# Patient Record
Sex: Female | Born: 1940 | Race: White | Hispanic: No | Marital: Married | Smoking: Former smoker
Health system: Southern US, Community
[De-identification: ages and names within clinical notes are randomized; demographics above are authoritative.]

## PROBLEM LIST (undated history)

## (undated) DIAGNOSIS — G609 Hereditary and idiopathic neuropathy, unspecified: Secondary | ICD-10-CM

## (undated) DIAGNOSIS — K5090 Crohn's disease, unspecified, without complications: Secondary | ICD-10-CM

## (undated) DIAGNOSIS — J449 Chronic obstructive pulmonary disease, unspecified: Secondary | ICD-10-CM

## (undated) DIAGNOSIS — F32A Depression, unspecified: Secondary | ICD-10-CM

## (undated) DIAGNOSIS — D510 Vitamin B12 deficiency anemia due to intrinsic factor deficiency: Secondary | ICD-10-CM

## (undated) DIAGNOSIS — A809 Acute poliomyelitis, unspecified: Secondary | ICD-10-CM

## (undated) DIAGNOSIS — G43909 Migraine, unspecified, not intractable, without status migrainosus: Secondary | ICD-10-CM

## (undated) DIAGNOSIS — M1990 Unspecified osteoarthritis, unspecified site: Secondary | ICD-10-CM

## (undated) DIAGNOSIS — F419 Anxiety disorder, unspecified: Secondary | ICD-10-CM

## (undated) DIAGNOSIS — D496 Neoplasm of unspecified behavior of brain: Secondary | ICD-10-CM

## (undated) DIAGNOSIS — E7800 Pure hypercholesterolemia, unspecified: Secondary | ICD-10-CM

## (undated) DIAGNOSIS — H3530 Unspecified macular degeneration: Secondary | ICD-10-CM

## (undated) DIAGNOSIS — F329 Major depressive disorder, single episode, unspecified: Secondary | ICD-10-CM

## (undated) DIAGNOSIS — E271 Primary adrenocortical insufficiency: Secondary | ICD-10-CM

## (undated) DIAGNOSIS — Z7901 Long term (current) use of anticoagulants: Secondary | ICD-10-CM

## (undated) DIAGNOSIS — K219 Gastro-esophageal reflux disease without esophagitis: Secondary | ICD-10-CM

## (undated) DIAGNOSIS — H9191 Unspecified hearing loss, right ear: Secondary | ICD-10-CM

## (undated) DIAGNOSIS — I10 Essential (primary) hypertension: Secondary | ICD-10-CM

## (undated) HISTORY — PX: HEMICOLECTOMY: SHX854

## (undated) HISTORY — PX: OTHER SURGICAL HISTORY: SHX169

## (undated) HISTORY — PX: CHOLECYSTECTOMY: SHX55

---

## 1998-05-16 ENCOUNTER — Ambulatory Visit (HOSPITAL_COMMUNITY): Admission: RE | Admit: 1998-05-16 | Discharge: 1998-05-16 | Payer: Self-pay | Admitting: General Surgery

## 1998-06-01 ENCOUNTER — Inpatient Hospital Stay (HOSPITAL_COMMUNITY): Admission: EM | Admit: 1998-06-01 | Discharge: 1998-06-03 | Payer: Self-pay | Admitting: Endocrinology

## 1998-06-30 ENCOUNTER — Ambulatory Visit (HOSPITAL_COMMUNITY): Admission: RE | Admit: 1998-06-30 | Discharge: 1998-06-30 | Payer: Self-pay | Admitting: Orthopedic Surgery

## 1998-07-21 ENCOUNTER — Other Ambulatory Visit: Admission: RE | Admit: 1998-07-21 | Discharge: 1998-07-21 | Payer: Self-pay | Admitting: Gynecology

## 1999-03-29 ENCOUNTER — Encounter: Payer: Self-pay | Admitting: Gastroenterology

## 1999-03-29 ENCOUNTER — Emergency Department (HOSPITAL_COMMUNITY): Admission: EM | Admit: 1999-03-29 | Discharge: 1999-03-29 | Payer: Self-pay | Admitting: Emergency Medicine

## 1999-05-10 ENCOUNTER — Ambulatory Visit (HOSPITAL_COMMUNITY): Admission: RE | Admit: 1999-05-10 | Discharge: 1999-05-10 | Payer: Self-pay | Admitting: Gastroenterology

## 1999-05-10 ENCOUNTER — Encounter (INDEPENDENT_AMBULATORY_CARE_PROVIDER_SITE_OTHER): Payer: Self-pay | Admitting: Specialist

## 1999-05-22 ENCOUNTER — Ambulatory Visit (HOSPITAL_COMMUNITY): Admission: RE | Admit: 1999-05-22 | Discharge: 1999-05-22 | Payer: Self-pay | Admitting: Gastroenterology

## 1999-05-22 ENCOUNTER — Encounter: Payer: Self-pay | Admitting: Gastroenterology

## 1999-05-24 ENCOUNTER — Encounter: Payer: Self-pay | Admitting: Gastroenterology

## 1999-05-24 ENCOUNTER — Ambulatory Visit (HOSPITAL_COMMUNITY): Admission: RE | Admit: 1999-05-24 | Discharge: 1999-05-24 | Payer: Self-pay | Admitting: Gastroenterology

## 1999-06-15 ENCOUNTER — Encounter: Payer: Self-pay | Admitting: General Surgery

## 1999-06-19 ENCOUNTER — Encounter (INDEPENDENT_AMBULATORY_CARE_PROVIDER_SITE_OTHER): Payer: Self-pay

## 1999-06-19 ENCOUNTER — Ambulatory Visit (HOSPITAL_COMMUNITY): Admission: RE | Admit: 1999-06-19 | Discharge: 1999-06-19 | Payer: Self-pay | Admitting: General Surgery

## 1999-08-09 ENCOUNTER — Ambulatory Visit (HOSPITAL_COMMUNITY): Admission: RE | Admit: 1999-08-09 | Discharge: 1999-08-09 | Payer: Self-pay | Admitting: Specialist

## 2004-03-27 ENCOUNTER — Emergency Department (HOSPITAL_COMMUNITY): Admission: EM | Admit: 2004-03-27 | Discharge: 2004-03-27 | Payer: Self-pay | Admitting: *Deleted

## 2004-05-23 ENCOUNTER — Other Ambulatory Visit: Admission: RE | Admit: 2004-05-23 | Discharge: 2004-05-23 | Payer: Self-pay | Admitting: Gynecology

## 2004-10-28 ENCOUNTER — Emergency Department (HOSPITAL_COMMUNITY): Admission: EM | Admit: 2004-10-28 | Discharge: 2004-10-28 | Payer: Self-pay | Admitting: Emergency Medicine

## 2005-09-19 ENCOUNTER — Encounter: Admission: RE | Admit: 2005-09-19 | Discharge: 2005-09-19 | Payer: Self-pay | Admitting: General Surgery

## 2006-03-01 ENCOUNTER — Encounter: Admission: RE | Admit: 2006-03-01 | Discharge: 2006-03-01 | Payer: Self-pay | Admitting: Endocrinology

## 2007-03-18 ENCOUNTER — Ambulatory Visit (HOSPITAL_COMMUNITY): Admission: RE | Admit: 2007-03-18 | Discharge: 2007-03-18 | Payer: Self-pay | Admitting: Endocrinology

## 2008-05-17 ENCOUNTER — Emergency Department (HOSPITAL_COMMUNITY): Admission: EM | Admit: 2008-05-17 | Discharge: 2008-05-17 | Payer: Self-pay | Admitting: Emergency Medicine

## 2008-05-20 ENCOUNTER — Encounter: Admission: RE | Admit: 2008-05-20 | Discharge: 2008-05-20 | Payer: Self-pay | Admitting: Orthopedic Surgery

## 2008-08-02 ENCOUNTER — Encounter: Admission: RE | Admit: 2008-08-02 | Discharge: 2008-08-02 | Payer: Self-pay | Admitting: Gastroenterology

## 2008-10-01 ENCOUNTER — Inpatient Hospital Stay (HOSPITAL_COMMUNITY): Admission: EM | Admit: 2008-10-01 | Discharge: 2008-10-04 | Payer: Self-pay | Admitting: Emergency Medicine

## 2009-01-18 ENCOUNTER — Encounter: Admission: RE | Admit: 2009-01-18 | Discharge: 2009-01-18 | Payer: Self-pay | Admitting: Gastroenterology

## 2009-01-27 ENCOUNTER — Encounter: Admission: RE | Admit: 2009-01-27 | Discharge: 2009-01-27 | Payer: Self-pay | Admitting: Gastroenterology

## 2009-04-05 ENCOUNTER — Encounter: Admission: RE | Admit: 2009-04-05 | Discharge: 2009-04-05 | Payer: Self-pay | Admitting: Orthopedic Surgery

## 2009-04-20 ENCOUNTER — Encounter: Admission: RE | Admit: 2009-04-20 | Discharge: 2009-04-20 | Payer: Self-pay | Admitting: Orthopedic Surgery

## 2010-05-25 ENCOUNTER — Encounter: Admission: RE | Admit: 2010-05-25 | Discharge: 2010-05-25 | Payer: Self-pay | Admitting: Endocrinology

## 2010-09-03 ENCOUNTER — Encounter: Payer: Self-pay | Admitting: Endocrinology

## 2010-11-28 LAB — BASIC METABOLIC PANEL
BUN: 13 mg/dL (ref 6–23)
CO2: 33 mEq/L — ABNORMAL HIGH (ref 19–32)
Calcium: 8.1 mg/dL — ABNORMAL LOW (ref 8.4–10.5)
Chloride: 104 mEq/L (ref 96–112)
Chloride: 92 mEq/L — ABNORMAL LOW (ref 96–112)
Chloride: 97 mEq/L (ref 96–112)
Creatinine, Ser: 0.9 mg/dL (ref 0.4–1.2)
Creatinine, Ser: 1.07 mg/dL (ref 0.4–1.2)
GFR calc Af Amer: >60 mL/min (ref >60)
GFR calc non Af Amer: 50 mL/min — ABNORMAL LOW (ref >60)
Glucose, Bld: 118 mg/dL — ABNORMAL HIGH (ref 70–99)
Glucose, Bld: 126 mg/dL — ABNORMAL HIGH (ref 70–99)
Potassium: 4 mEq/L (ref 3.5–5.1)
Sodium: 134 mEq/L — ABNORMAL LOW (ref 135–145)

## 2010-11-28 LAB — CARDIAC PANEL(CRET KIN+CKTOT+MB+TROPI)
Relative Index: INVALID (ref 0.0–2.5)
Relative Index: INVALID (ref 0.0–2.5)
Total CK: 56 U/L (ref 7–177)
Total CK: 56 U/L (ref 7–177)
Troponin I: 0.01 ng/mL (ref 0.00–0.06)
Troponin I: 0.02 ng/mL (ref 0.00–0.06)

## 2010-11-28 LAB — COMPREHENSIVE METABOLIC PANEL
ALT: 15 U/L (ref 0–35)
Albumin: 3.4 g/dL — ABNORMAL LOW (ref 3.5–5.2)
Alkaline Phosphatase: 54 U/L (ref 39–117)
Chloride: 94 mEq/L — ABNORMAL LOW (ref 96–112)
Creatinine, Ser: 1.09 mg/dL (ref 0.4–1.2)
Potassium: 3 mEq/L — ABNORMAL LOW (ref 3.5–5.1)
Total Bilirubin: 0.7 mg/dL (ref 0.3–1.2)
Total Protein: 6 g/dL (ref 6.0–8.3)

## 2010-11-28 LAB — URINALYSIS, ROUTINE W REFLEX MICROSCOPIC
Bilirubin Urine: NEGATIVE
Glucose, UA: NEGATIVE mg/dL
Specific Gravity, Urine: 1.013 (ref 1.005–1.030)
pH: 5.5 (ref 5.0–8.0)

## 2010-11-28 LAB — CBC
Hemoglobin: 12.8 g/dL (ref 12.0–15.0)
Hemoglobin: 13.3 g/dL (ref 12.0–15.0)
MCHC: 33.4 g/dL (ref 30.0–36.0)
MCV: 99 fL (ref 78.0–100.0)
MCV: 99.4 fL (ref 78.0–100.0)
MCV: 99.8 fL (ref 78.0–100.0)
Platelets: 131 10*3/uL — ABNORMAL LOW (ref 150–400)
RBC: 3.84 MIL/uL — ABNORMAL LOW (ref 3.87–5.11)
RBC: 4.49 MIL/uL (ref 3.87–5.11)
RDW: 14 % (ref 11.5–15.5)
RDW: 14.3 % (ref 11.5–15.5)
WBC: 10.2 10*3/uL (ref 4.0–10.5)

## 2010-11-28 LAB — DIFFERENTIAL
Eosinophils Relative: 0 % (ref 0–5)
Lymphocytes Relative: 12 % (ref 12–46)
Monocytes Absolute: 0.9 10*3/uL (ref 0.1–1.0)
Monocytes Relative: 9 % (ref 3–12)
Neutrophils Relative %: 79 % — ABNORMAL HIGH (ref 43–77)

## 2010-11-28 LAB — URINE CULTURE: Colony Count: >=100000

## 2010-11-28 LAB — CK TOTAL AND CKMB (NOT AT ARMC)
CK, MB: 1.1 ng/mL (ref 0.3–4.0)
Relative Index: INVALID (ref 0.0–2.5)
Total CK: 49 U/L (ref 7–177)

## 2010-12-26 NOTE — Discharge Summary (Signed)
NAMETEKOA, HAMOR                ACCOUNT NO.:  [id number]   MEDICAL RECORD NO.:  [id number]          PATIENT TYPE:  INP   LOCATION:  1436                         FACILITY:  [facility]   PHYSICIAN:  Tera Mater. [name], M.D. DATE OF BIRTH:  1941-05-10   DATE OF ADMISSION:  10/01/2008  DATE OF DISCHARGE:  10/04/2008                               DISCHARGE SUMMARY   DISCHARGE DIAGNOSES:  1. Nausea and vomiting with volume deficit, clinically improved.  2. Transient hypotension due to relative adrenal insufficiency.  3. Severe chronic obstructive pulmonary disease.  4. History of Crohn's disease for 45 years with multiple surgeries,      now stable on low dose steroids.  5. Osteoporosis with a multitude of compression fractures.  6. Treated vitamin D deficiency.  7. History of pulmonary embolism with negative VQ scan this admission.  8. Hypertension, improved on less medication.  9. Impaired glucose tolerance without significant flare this      hospitalization.  10.Depression/anxiety, under control.  11.History of pernicious anemia with stable status.  12.Enterococcal urinary tract infection.  13.Hypokalemia.   PROCEDURES:  Included a ventilation-perfusion lung scan.   Ms. Lehrmann is a 70-year white female, longstanding patient of my  practice, who presented to my partner Dr. Sandra Cockayne on  October 01, 2008.  She was there with nausea and vomiting with  dehydration and hypotension.  There was felt to be some component of  relative adrenal insufficiency since the patient is on chronic steroids.  The patient had been taking her medications but there is some question  whether or not she had been able to keep all of that down.  The patient  is now clinically improved quite a bit.  She has received extensive  fluid resuscitation and blood pressure has done well, now bouncing from  the 120s to 130s systolic.  She had some bradycardia earlier and her  nadolol has been reduced and now  she is running in the 70s to 80s.  Her  oxygen saturation is fair at 94% which is consistent with significant  chronic lung disease.  She is eating breakfast at the present with no  complaints.  She has had three bowel movements on the day of admission,  no diarrhea.  She has had no fevers, chills or sweats while here.  She  does have somewhat of a deep cough with no production.  I do not think  this represents a significant pulmonary issue.  Her energy is improving.  She has no other complaints.  Today's vital signs:  Blood pressure  133/59, pulse 73, respiratory rate 18, temperature 98.1, O2 sat 94%.   RADIOLOGY/TESTING:  Nuclear medicine perfusion lung scan on the 19th was  low probability of pulmonary embolism with findings consistent with  severe COPD.  Her urine cultures noted with enterococcus with  sensitivities pending.  Sodium was 145, potassium 2.8, chloride 104, CO2  - 33, BUN 19, creatinine 0.9, glucose of 126, calcium is 8.1, white  count is 6400, hemoglobin 13.3, MCV 99.4, platelets 134,000.  Cardiac  enzymes were done on the 20th  with a troponin of 0.02 and a CK of 56 and  on the 20th also troponin 0.01 with a CK of 56 and troponin on the 19th  was 0.02 and CK was 49.  Initial white count was 10,200 and hemoglobin  15.1, platelets 147,000.  Initial chemistries:  Sodium 138, potassium 3,  chloride 94, CO2 - 36, BUN 12, creatinine 1.09, glucose 104.  A  urinalysis showed large blood, small nitrates with a microscopic showing  11 - 20 white cells.   SUMMARY:  We have a 70 year old white female presenting with what  probably was a viral illness leading to nausea, vomiting and then  relative adrenal insufficiency.  She has done well with fluid  resuscitation.  Remaining issues are need to treat an enterococcal UTI  and replacement of potassium still needed.  She is doing better on less  blood pressure medications.  She is discharged home in improved  condition.   DIET:  No  concentrated sweets.   MEDICATIONS AT DISCHARGE:  1. Amlodipine 2.5 daily.  2. Calcium 600 twice a day.  3. Cymbalta 60 daily.  4. Darvon 2 tablets four times a day as needed.  5. Fentanyl patch 75 mcg every 48 hours.  6. HCTZ 25 mg daily.  7. MiraLax 17 grams daily.  8. Potassium is going up to a new dose of 40 mEq twice daily.  9. Prednisone will be a dose of 7.5 mg daily for 3 days, and then down      to 5 mg daily.  10.Ranitidine is 150 daily.  11.Simvastatin 80 mg daily.  12.Xanax was 0.25 three times a day.  13.Miacalcin nasal sprays daily.  14.Aspirin 81 daily.  15.Effexor XR 150 daily.  16.Asacol 400 daily.  17.Vitamin D 50,000 a week.  18.Nadolol actually will be held until I see her on March 10 when she      sees me back.           ______________________________  Tera Mater. [name], M.D.     SAS/MEDQ  D:  10/04/2008  T:  10/04/2008  Job:  [id number]

## 2010-12-26 NOTE — H&P (Signed)
NAMELAEL, WETHERBEE                ACCOUNT NO.:  [id number]   MEDICAL RECORD NO.:  [id number]          PATIENT TYPE:  INP   LOCATION:  1436                         FACILITY:  [facility]   PHYSICIAN:  Kari Baars, M.D.  DATE OF BIRTH:  1941-05-29   DATE OF ADMISSION:  10/01/2008  DATE OF DISCHARGE:                              HISTORY & PHYSICAL   CHIEF COMPLAINT:  Nausea, vomiting, shortness of breath.   HISTORY OF PRESENT ILLNESS:  Ms. Pollitt is a 70 year old white female  with a history of Crohn's disease on chronic steroid therapy, chronic  pain secondary to multiple compression fractures, history of PE, and  presumed COPD who presented to our office today with a complaint of  nausea, vomiting and increased shortness of breath over the past 8  hours.  The patient reports that she has had a slight increase in  shortness of breath recently, but was at her baseline until yesterday  evening when she noted she had an increasing cough after walking in the  cold.  Last evening she awoke at about 3:00 a.m. with nausea and  vomiting and retching.  She had clears emesis throughout the morning  this morning and then developed increasing shortness of breath and  cough.  She presented to our office where her oxygen saturations were  87%.  She reports that she had a fever of 101 at home this morning.  Denies any diarrhea or significant abdominal pain.  Her Crohn's has been  stable, quiescent off of her Asacol and on prednisone.  She has been off  of her Asacol since 07/2008.   PAST MEDICAL HISTORY:  1. COPD.  2. Crohn's disease since 1966 on chronic steroid therapy status post      ileal resection and fistula repair.  3. Osteoporosis with multiple compression fractures.  4. Vitamin D deficiency.  5. History of pulmonary embolism.  6. History of avascular necrosis of the hip.  7. Hypertension.  8. Glucose tolerance  9. Depression/anxiety.  10.Pernicious anemia.  11.Hyperlipidemia.  12.Status post cholecystectomy.  13.Status post cataract repair.   CURRENT MEDICATIONS:  1. Prednisone 5 mg daily.  2. Klor-Con 10 mEq daily.  3. Nadolol 20 mg daily.  4. Norvasc 2.5 mg daily.  5. HCTZ 25 mg daily.  6. Zocor 80 mg daily.  7. Alprazolam 0.25 mg t.i.d.  8. Darvocet N-100.  9. Darvon eight daily.  10.Duragesic 75 mcg q.48 h.  11.Calcium/vitamin D.  12.Aspirin 81 mg daily.  13.Cymbalta 60 mg daily.  14.Zantac 150 mg b.i.d.   ALLERGIES:  PENICILLIN, PRILOSEC, BACTRIM, VOLTAREN, LOTREL.   SOCIAL HISTORY:  She is married and smokes a pack per day.  She is  currently in a smoking rehabilitation program and has decreased her  smoking significantly.   FAMILY HISTORY:  No COPD or heart disease.   PHYSICAL EXAMINATION:  VITAL SIGNS:  Temperature 98.8, pulse 89, blood  pressure 124/78, respirations 18, weight 113 which is down 9 pounds,  oxygen saturation 87% on room air.  GENERAL:  Frail, pale appearing female in a wheelchair.  HEENT:  Oropharynx  is dry.  NECK:  Supple without lymphadenopathy, JVD or carotid bruits.  HEART:  Regular rate and rhythm, without murmurs, rubs or gallops.  LUNGS:  Clear to auscultation bilaterally.  No wheezing or crackles.  ABDOMEN:  Soft, nondistended, nontender with normoactive bowel sounds.  No tenderness or rebound or guarding.  EXTREMITIES:  No clubbing, cyanosis or edema.  She does have decrease in  skin turgor.  MUSCULOSKELETAL:  She has severe kyphosis.   STUDIES:  1. EKG shows normal sinus rhythm with nonspecific ST changes.  2. Chest x-ray shows hyperinflation with severe kyphosis.  No acute      cardiopulmonary disease.   ASSESSMENT/PLAN:  1. Nausea and vomiting resulting in dehydration - this may be due to      early gastroenteritis (norovirus is in the community) versus      medication related (narcotics).  Her exam does not suggest a small-      bowel obstruction or intra-abdominal source.  Will obtain an acute       abdominal series, CBC, C-met, urinalysis with cultures.  Rule out      MI though cardiac ischemia is slow on the differential.  Will treat      empirically with IV antiemetics and IV fluid hydration.  2. Hypoxia/shortness of breath.  Possible aspiration event.  We will      treat empirically with Avelox 400 mg IV daily and oxygen as needed.      Given her history of pulmonary embolism.  Will obtain a VQ scan to      rule out pulmonary embolism.  3. Chronic pain secondary to compression fractions.  Continue      Duragesic and Darvon.  4. DVT prophylaxis with Lovenox.   DISPOSITION:  Anticipate discharge to home once tolerating p.o.'s.   DISCHARGE INSTRUCTIONS:  Diet:  Clear liquid diet.  We will advance as  tolerated.      Kari Baars, M.D.  Electronically Signed     WS/MEDQ  D:  10/01/2008  T:  10/02/2008  Job:  [id number]   cc:   Bernette Redbird, M.D.  Fax: [phone]   Tera Mater. [name], M.D.  Fax: [phone]

## 2011-05-15 LAB — URINALYSIS, ROUTINE W REFLEX MICROSCOPIC
Ketones, ur: NEGATIVE
Protein, ur: NEGATIVE
Urobilinogen, UA: 0.2

## 2011-05-15 LAB — URINE MICROSCOPIC-ADD ON

## 2011-05-15 LAB — DIFFERENTIAL
Basophils Absolute: 0
Basophils Relative: 0
Eosinophils Absolute: 0
Eosinophils Relative: 0
Lymphocytes Relative: 29
Monocytes Absolute: 0.5

## 2011-05-15 LAB — CBC
HCT: 45
Hemoglobin: 14.9
MCHC: 33
Platelets: 168
RDW: 14.6

## 2011-05-15 LAB — POCT CARDIAC MARKERS: Myoglobin, poc: 60.6

## 2011-08-12 ENCOUNTER — Emergency Department (HOSPITAL_COMMUNITY): Payer: Medicare Other

## 2011-08-12 ENCOUNTER — Encounter: Payer: Self-pay | Admitting: *Deleted

## 2011-08-12 ENCOUNTER — Inpatient Hospital Stay (HOSPITAL_COMMUNITY)
Admission: EM | Admit: 2011-08-12 | Discharge: 2011-08-19 | DRG: 378 | Disposition: A | Discharge status: Home / Self Care | Payer: Medicare Other | Attending: Endocrinology | Admitting: Endocrinology

## 2011-08-12 DIAGNOSIS — I2699 Other pulmonary embolism without acute cor pulmonale: Secondary | ICD-10-CM

## 2011-08-12 DIAGNOSIS — F329 Major depressive disorder, single episode, unspecified: Secondary | ICD-10-CM | POA: Diagnosis present

## 2011-08-12 DIAGNOSIS — R197 Diarrhea, unspecified: Secondary | ICD-10-CM | POA: Diagnosis present

## 2011-08-12 DIAGNOSIS — F17200 Nicotine dependence, unspecified, uncomplicated: Secondary | ICD-10-CM | POA: Diagnosis present

## 2011-08-12 DIAGNOSIS — D126 Benign neoplasm of colon, unspecified: Secondary | ICD-10-CM | POA: Diagnosis present

## 2011-08-12 DIAGNOSIS — G8929 Other chronic pain: Secondary | ICD-10-CM | POA: Diagnosis present

## 2011-08-12 DIAGNOSIS — K921 Melena: Principal | ICD-10-CM | POA: Diagnosis present

## 2011-08-12 DIAGNOSIS — R791 Abnormal coagulation profile: Secondary | ICD-10-CM | POA: Diagnosis present

## 2011-08-12 DIAGNOSIS — K922 Gastrointestinal hemorrhage, unspecified: Secondary | ICD-10-CM | POA: Diagnosis present

## 2011-08-12 DIAGNOSIS — G894 Chronic pain syndrome: Secondary | ICD-10-CM | POA: Diagnosis present

## 2011-08-12 DIAGNOSIS — E876 Hypokalemia: Secondary | ICD-10-CM | POA: Diagnosis present

## 2011-08-12 DIAGNOSIS — M810 Age-related osteoporosis without current pathological fracture: Secondary | ICD-10-CM | POA: Diagnosis present

## 2011-08-12 DIAGNOSIS — Z7901 Long term (current) use of anticoagulants: Secondary | ICD-10-CM

## 2011-08-12 DIAGNOSIS — K625 Hemorrhage of anus and rectum: Secondary | ICD-10-CM

## 2011-08-12 DIAGNOSIS — D62 Acute posthemorrhagic anemia: Secondary | ICD-10-CM | POA: Diagnosis present

## 2011-08-12 DIAGNOSIS — F411 Generalized anxiety disorder: Secondary | ICD-10-CM | POA: Diagnosis present

## 2011-08-12 DIAGNOSIS — E2740 Unspecified adrenocortical insufficiency: Secondary | ICD-10-CM | POA: Diagnosis present

## 2011-08-12 DIAGNOSIS — E119 Type 2 diabetes mellitus without complications: Secondary | ICD-10-CM | POA: Diagnosis present

## 2011-08-12 DIAGNOSIS — N390 Urinary tract infection, site not specified: Secondary | ICD-10-CM | POA: Diagnosis present

## 2011-08-12 DIAGNOSIS — J449 Chronic obstructive pulmonary disease, unspecified: Secondary | ICD-10-CM | POA: Diagnosis present

## 2011-08-12 DIAGNOSIS — E2749 Other adrenocortical insufficiency: Secondary | ICD-10-CM | POA: Diagnosis present

## 2011-08-12 DIAGNOSIS — R109 Unspecified abdominal pain: Secondary | ICD-10-CM | POA: Diagnosis present

## 2011-08-12 DIAGNOSIS — R319 Hematuria, unspecified: Secondary | ICD-10-CM | POA: Diagnosis present

## 2011-08-12 DIAGNOSIS — J4489 Other specified chronic obstructive pulmonary disease: Secondary | ICD-10-CM | POA: Diagnosis present

## 2011-08-12 DIAGNOSIS — D6832 Hemorrhagic disorder due to extrinsic circulating anticoagulants: Secondary | ICD-10-CM

## 2011-08-12 DIAGNOSIS — F3289 Other specified depressive episodes: Secondary | ICD-10-CM | POA: Diagnosis present

## 2011-08-12 DIAGNOSIS — T45515A Adverse effect of anticoagulants, initial encounter: Secondary | ICD-10-CM | POA: Diagnosis present

## 2011-08-12 DIAGNOSIS — K5090 Crohn's disease, unspecified, without complications: Secondary | ICD-10-CM | POA: Diagnosis present

## 2011-08-12 DIAGNOSIS — I2782 Chronic pulmonary embolism: Secondary | ICD-10-CM | POA: Diagnosis present

## 2011-08-12 HISTORY — DX: Gastro-esophageal reflux disease without esophagitis: K21.9

## 2011-08-12 HISTORY — DX: Primary adrenocortical insufficiency: E27.1

## 2011-08-12 HISTORY — DX: Unspecified osteoarthritis, unspecified site: M19.90

## 2011-08-12 HISTORY — DX: Crohn's disease, unspecified, without complications: K50.90

## 2011-08-12 HISTORY — DX: Major depressive disorder, single episode, unspecified: F32.9

## 2011-08-12 HISTORY — DX: Depression, unspecified: F32.A

## 2011-08-12 HISTORY — DX: Essential (primary) hypertension: I10

## 2011-08-12 HISTORY — DX: Acute poliomyelitis, unspecified: A80.9

## 2011-08-12 HISTORY — DX: Chronic obstructive pulmonary disease, unspecified: J44.9

## 2011-08-12 HISTORY — DX: Hereditary and idiopathic neuropathy, unspecified: G60.9

## 2011-08-12 HISTORY — DX: Anxiety disorder, unspecified: F41.9

## 2011-08-12 HISTORY — DX: Migraine, unspecified, not intractable, without status migrainosus: G43.909

## 2011-08-12 HISTORY — DX: Neoplasm of unspecified behavior of brain: D49.6

## 2011-08-12 HISTORY — DX: Long term (current) use of anticoagulants: Z79.01

## 2011-08-12 HISTORY — DX: Unspecified macular degeneration: H35.30

## 2011-08-12 HISTORY — DX: Vitamin B12 deficiency anemia due to intrinsic factor deficiency: D51.0

## 2011-08-12 HISTORY — DX: Pure hypercholesterolemia, unspecified: E78.00

## 2011-08-12 LAB — COMPREHENSIVE METABOLIC PANEL
ALT: 6 U/L (ref 0–35)
AST: 10 U/L (ref 0–37)
Alkaline Phosphatase: 42 U/L (ref 39–117)
CO2: 31 mEq/L (ref 19–32)
Calcium: 8.2 mg/dL — ABNORMAL LOW (ref 8.4–10.5)
Creatinine, Ser: 1.01 mg/dL (ref 0.50–1.10)
GFR calc Af Amer: 65 mL/min — ABNORMAL LOW (ref >90)
GFR calc non Af Amer: 56 mL/min — ABNORMAL LOW (ref >90)
Sodium: 138 mEq/L (ref 135–145)
Total Bilirubin: 0.4 mg/dL (ref 0.3–1.2)
Total Protein: 4.9 g/dL — ABNORMAL LOW (ref 6.0–8.3)

## 2011-08-12 LAB — COMPREHENSIVE METABOLIC PANEL WITH GFR
Albumin: 2.5 g/dL — ABNORMAL LOW (ref 3.5–5.2)
BUN: 27 mg/dL — ABNORMAL HIGH (ref 6–23)
Chloride: 102 meq/L (ref 96–112)
Glucose, Bld: 122 mg/dL — ABNORMAL HIGH (ref 70–99)
Potassium: 3.3 meq/L — ABNORMAL LOW (ref 3.5–5.1)

## 2011-08-12 LAB — DIFFERENTIAL
Basophils Absolute: 0 10*3/uL (ref 0.0–0.1)
Basophils Relative: 0 % (ref 0–1)
Eosinophils Absolute: 0.1 10*3/uL (ref 0.0–0.7)
Eosinophils Relative: 0 % (ref 0–5)
Lymphocytes Relative: 21 % (ref 12–46)
Lymphs Abs: 3 K/uL (ref 0.7–4.0)
Monocytes Absolute: 1.9 10*3/uL — ABNORMAL HIGH (ref 0.1–1.0)
Monocytes Relative: 13 % — ABNORMAL HIGH (ref 3–12)
Neutro Abs: 9.1 K/uL — ABNORMAL HIGH (ref 1.7–7.7)
Neutrophils Relative %: 65 % (ref 43–77)

## 2011-08-12 LAB — CBC
HCT: 25.3 % — ABNORMAL LOW (ref 36.0–46.0)
Hemoglobin: 8.3 g/dL — ABNORMAL LOW (ref 12.0–15.0)
MCH: 31.6 pg (ref 26.0–34.0)
MCHC: 32.8 g/dL (ref 30.0–36.0)
MCV: 96.2 fL (ref 78.0–100.0)
Platelets: 197 10*3/uL (ref 150–400)
RBC: 2.63 MIL/uL — ABNORMAL LOW (ref 3.87–5.11)
RDW: 15.4 % (ref 11.5–15.5)
WBC: 14 K/uL — ABNORMAL HIGH (ref 4.0–10.5)

## 2011-08-12 LAB — LACTIC ACID, PLASMA: Lactic Acid, Venous: 1.4 mmol/L (ref 0.5–2.2)

## 2011-08-12 LAB — PROTIME-INR
INR: 5.61 (ref 0.00–1.49)
Prothrombin Time: 51.5 s — ABNORMAL HIGH (ref 11.6–15.2)

## 2011-08-12 LAB — LIPASE, BLOOD: Lipase: 8 U/L — ABNORMAL LOW (ref 11–59)

## 2011-08-12 LAB — APTT: aPTT: 93 s — ABNORMAL HIGH (ref 24–37)

## 2011-08-12 LAB — OCCULT BLOOD, POC DEVICE: Fecal Occult Bld: POSITIVE

## 2011-08-12 MED ORDER — FENTANYL CITRATE 0.05 MG/ML IJ SOLN
50.0000 ug | INTRAMUSCULAR | Status: DC | PRN
  Administered 2011-08-12 – 2011-08-13 (×2): 50 ug via INTRAVENOUS
  Filled 2011-08-12 (×2): qty 2

## 2011-08-12 MED ORDER — SODIUM CHLORIDE 0.9 % IV SOLN
Freq: Once | INTRAVENOUS | Status: AC
  Administered 2011-08-12: 23:00:00 via INTRAVENOUS

## 2011-08-12 MED ORDER — SODIUM CHLORIDE 0.9 % IV BOLUS (SEPSIS)
500.0000 mL | Freq: Once | INTRAVENOUS | Status: AC
  Administered 2011-08-13: 03:00:00 via INTRAVENOUS

## 2011-08-12 NOTE — ED Notes (Signed)
ZOX:[id number]<AV> Expected date:08/12/11<BR> Expected time: 6:33 PM<BR> Means of arrival:Ambulance<BR> Comments:<BR> M51 - 69yoF Blood in stool?

## 2011-08-12 NOTE — ED Notes (Signed)
Pt reports upper abd pain last night, took pain meds. Last night and today began having loose BM, husband noticed blood in stool. Also reports pt has been weaker "for a while." Nausea, denies vomiting.

## 2011-08-12 NOTE — ED Notes (Signed)
Pt had BM in bed pan. Dark blood noted in stool.

## 2011-08-12 NOTE — ED Provider Notes (Addendum)
History     CSN: [id number]  Arrival date & time 08/12/11  2130   First MD Initiated Contact with Patient 08/12/11 2058      Chief Complaint  Patient presents with  . Rectal Bleeding  . Diarrhea  . Abdominal Pain  . Weakness    (Consider location/radiation/quality/duration/timing/severity/associated sxs/prior treatment) HPI Comments: Pt with h/o DVT, PE on coumadin developed diffuse abd pain associated with dark diarrheal stools multiple times since around 0400 today.  No N/V, but appetite is very decreased.  Pt has crhonic pain on tramadol and duragesic patch due to severe osteoporosis.  She feels very weak, fatigued, difficult to stand or get around.  No CP, SOB.  Dr. [name] is her PCP.  She has h/o constipation and HTN as well, GERD.  Pt has felt febrile with chills all day today as well.    Patient is a 70 y.o. female presenting with hematochezia, diarrhea, abdominal pain, and weakness. The history is provided by the patient and the spouse.  Rectal Bleeding  Associated symptoms include a fever, abdominal pain and diarrhea. Pertinent negatives include no nausea, no vomiting and no chest pain.  Diarrhea The primary symptoms include fever, fatigue, abdominal pain, diarrhea and hematochezia. Primary symptoms do not include nausea or vomiting.  The illness is also significant for chills.  Abdominal Pain The primary symptoms of the illness include abdominal pain, fever, fatigue, diarrhea and hematochezia. The primary symptoms of the illness do not include shortness of breath, nausea or vomiting.  Additional symptoms associated with the illness include chills.  Weakness The primary symptoms include fever. Primary symptoms do not include nausea or vomiting.  Additional symptoms include weakness.    Past Medical History  Diagnosis Date  . Polio   . Osteoporosis   . Crohn's disease   . Brain tumor   . Migraines   . Hypercholesteremia   . Hypertension   . GERD (gastroesophageal  reflux disease)   . Anxiety   . Depression   . Emphysema   . Macular degeneration   . Cataract   . Arthritis   . Pernicious anemia   . Primary adrenal deficiency     No past surgical history on file.  No family history on file.  History  Substance Use Topics  . Smoking status: Never Smoker   . Smokeless tobacco: Not on file  . Alcohol Use: No    OB History    Grav Para Term Preterm Abortions TAB SAB Ect Mult Living                  Review of Systems  Constitutional: Positive for fever, chills, activity change, appetite change and fatigue.  Respiratory: Negative for shortness of breath.   Cardiovascular: Negative for chest pain.  Gastrointestinal: Positive for abdominal pain, diarrhea, blood in stool and hematochezia. Negative for nausea and vomiting.  Neurological: Positive for weakness.  All other systems reviewed and are negative.    Allergies  Iohexol and Penicillins  Home Medications   Current Outpatient Rx  Name Route Sig Dispense Refill  . ALPRAZOLAM 0.25 MG PO TABS Oral Take 0.25 mg by mouth 3 (three) times daily.      Marland Kitchen AMLODIPINE BESYLATE 2.5 MG PO TABS Oral Take 2.5 mg by mouth daily.      Marland Kitchen CALCITONIN (SALMON) 200 UNIT/ACT NA SOLN Nasal Place 1 spray into the nose every other day.      Marland Kitchen CALCIUM CARBONATE 600 MG PO TABS Oral Take 600  mg by mouth 2 (two) times daily with a meal.      . CIPROFLOXACIN HCL 500 MG PO TABS Oral Take 500 mg by mouth 2 (two) times daily.      . DULOXETINE HCL 60 MG PO CPEP Oral Take 60 mg by mouth daily.      . FENTANYL 75 MCG/HR TD PT72 Transdermal Place 1 patch onto the skin every other day.      Marland Kitchen GABAPENTIN (PHN) 300 MG PO TABS Oral Take 1 tablet by mouth daily.      . MOMETASONE FURO-FORMOTEROL FUM 100-5 MCG/ACT IN AERO Inhalation Inhale 2 puffs into the lungs 2 (two) times daily.      Marland Kitchen NADOLOL 20 MG PO TABS Oral Take 20 mg by mouth daily.      Marland Kitchen POLYETHYLENE GLYCOL 3350 PO PACK Oral Take 17 g by mouth daily as needed.  constipation     . POTASSIUM CHLORIDE 10 MEQ PO TBCR Oral Take 5-10 mEq by mouth as directed. Takes 1 tablet daily Monday through Friday for dosage and takes 1/2 tablet Saturday and Sunday for dosage     . PREDNISONE 5 MG PO TABS Oral Take 5 mg by mouth 2 (two) times daily.      Marland Kitchen RANITIDINE HCL 300 MG PO CAPS Oral Take 300 mg by mouth 2 (two) times daily.      Marland Kitchen SIMVASTATIN 80 MG PO TABS Oral Take 80 mg by mouth daily.      . TRAMADOL HCL 50 MG PO TABS Oral Take 100 mg by mouth every 8 (eight) hours as needed. Pain.Marland KitchenMarland KitchenMaximum dose= 8 tablets per day     . VITAMIN D (ERGOCALCIFEROL) 50000 UNITS PO CAPS Oral Take 50,000 Units by mouth every 7 (seven) days.      . WARFARIN SODIUM 5 MG PO TABS Oral Take 5 mg by mouth 2 (two) times daily.        BP 123/93  Pulse 91  Temp(Src) 98.3 F (36.8 C) (Oral)  Resp 22  SpO2 94%  Physical Exam  Nursing note and vitals reviewed. Constitutional: She appears well-developed and well-nourished.  HENT:  Head: Normocephalic and atraumatic.  Mouth/Throat: Uvula is midline. Mucous membranes are dry.  Eyes: Pupils are equal, round, and reactive to light.  Cardiovascular: Normal rate, regular rhythm and normal pulses.   Pulmonary/Chest: Effort normal. No respiratory distress. She has no wheezes.  Abdominal:       Hemorrhoids present stool is black, heme positive. Chaperone present on exam.    Neurological: She is alert. She has normal strength. No sensory deficit. GCS eye subscore is 4. GCS verbal subscore is 5. GCS motor subscore is 6.  Psychiatric: She has a normal mood and affect. She is slowed. Cognition and memory are not impaired.    ED Course  Procedures (including critical care time)  CRITICAL CARE Performed by: Lear Ng.   Total critical care time: 30 min  Critical care time was exclusive of separately billable procedures and treating other patients.  Critical care was necessary to treat or prevent imminent or  life-threatening deterioration.  Critical care was time spent personally by me on the following activities: development of treatment plan with patient and/or surrogate as well as nursing, discussions with consultants, evaluation of patient's response to treatment, examination of patient, obtaining history from patient or surrogate, ordering and performing treatments and interventions, ordering and review of laboratory studies, ordering and review of radiographic studies, pulse oximetry and re-evaluation  of patient's condition.  Labs Reviewed  CBC - Abnormal; Notable for the following:    WBC 14.0 (*)    RBC 2.63 (*)    Hemoglobin 8.3 (*)    HCT 25.3 (*)    All other components within normal limits  DIFFERENTIAL - Abnormal; Notable for the following:    Neutro Abs 9.1 (*)    Monocytes Relative 13 (*)    Monocytes Absolute 1.9 (*)    All other components within normal limits  COMPREHENSIVE METABOLIC PANEL - Abnormal; Notable for the following:    Potassium 3.3 (*)    Glucose, Bld 122 (*)    BUN 27 (*)    Calcium 8.2 (*)    Total Protein 4.9 (*)    Albumin 2.5 (*)    GFR calc non Af Amer 56 (*)    GFR calc Af Amer 65 (*)    All other components within normal limits  LIPASE, BLOOD - Abnormal; Notable for the following:    Lipase 8 (*)    All other components within normal limits  APTT - Abnormal; Notable for the following:    aPTT 93 (*)    All other components within normal limits  PROTIME-INR - Abnormal; Notable for the following:    Prothrombin Time 51.5 (*) REPEATED TO VERIFY   INR 5.61 (*)    All other components within normal limits  LACTIC ACID, PLASMA  OCCULT BLOOD, POC DEVICE  POCT OCCULT BLOOD STOOL, DEVICE  URINALYSIS, ROUTINE W REFLEX MICROSCOPIC  TYPE AND SCREEN  PREPARE RBC (CROSSMATCH)  PREPARE FRESH FROZEN PLASMA   No results found.   1. Rectal bleeding   2. Warfarin-induced coagulopathy   3. Abdominal pain       MDM  Pt is mildly tachycardic at  times, lower GI bleeding is present . Will check labs, INR.  Will need IVF's, monitoring.  Will check renal function, get CT scan as well for diffuse abd pain.  No guard or rebound however.  Lactic acid is pending as well.        12:06 AM Spoke to Dr. Clelia Croft who will see pt and admit.  PT's BP is somewhat lower, although mentation is still fine.  Will give more fluids and give FFP and 2 units of PRBC's for now.  Pt's pain is somewhat improved after meds.  Hgb si down to 8.3 from 13.3 in 2010.  Pt is coagulopathic, likely from coumadin use with INR of 5.6.    Gavin Pound. Oletta Lamas, MD 08/13/11 Burna Mortimer  Gavin Pound. Oletta Lamas, MD 08/13/11 [phone]

## 2011-08-13 ENCOUNTER — Encounter (HOSPITAL_COMMUNITY): Payer: Self-pay | Admitting: Internal Medicine

## 2011-08-13 DIAGNOSIS — K5090 Crohn's disease, unspecified, without complications: Secondary | ICD-10-CM | POA: Diagnosis present

## 2011-08-13 DIAGNOSIS — G894 Chronic pain syndrome: Secondary | ICD-10-CM | POA: Diagnosis present

## 2011-08-13 DIAGNOSIS — K922 Gastrointestinal hemorrhage, unspecified: Secondary | ICD-10-CM | POA: Diagnosis present

## 2011-08-13 DIAGNOSIS — R791 Abnormal coagulation profile: Secondary | ICD-10-CM | POA: Diagnosis present

## 2011-08-13 DIAGNOSIS — R197 Diarrhea, unspecified: Secondary | ICD-10-CM | POA: Diagnosis present

## 2011-08-13 DIAGNOSIS — D62 Acute posthemorrhagic anemia: Secondary | ICD-10-CM | POA: Diagnosis present

## 2011-08-13 DIAGNOSIS — I2699 Other pulmonary embolism without acute cor pulmonale: Secondary | ICD-10-CM

## 2011-08-13 DIAGNOSIS — E2740 Unspecified adrenocortical insufficiency: Secondary | ICD-10-CM | POA: Diagnosis present

## 2011-08-13 DIAGNOSIS — R109 Unspecified abdominal pain: Secondary | ICD-10-CM | POA: Diagnosis present

## 2011-08-13 LAB — CBC
Hemoglobin: 8.6 g/dL — ABNORMAL LOW (ref 12.0–15.0)
MCH: 30.6 pg (ref 26.0–34.0)
MCV: 88.7 fL (ref 78.0–100.0)
Platelets: 114 10*3/uL — ABNORMAL LOW (ref 150–400)
RBC: 2.77 MIL/uL — ABNORMAL LOW (ref 3.87–5.11)
RDW: 18.4 % — ABNORMAL HIGH (ref 11.5–15.5)
WBC: 10.1 10*3/uL (ref 4.0–10.5)

## 2011-08-13 LAB — BASIC METABOLIC PANEL
GFR calc Af Amer: 75 mL/min — ABNORMAL LOW (ref >90)
GFR calc non Af Amer: 65 mL/min — ABNORMAL LOW (ref >90)
Glucose, Bld: 113 mg/dL — ABNORMAL HIGH (ref 70–99)
Potassium: 3.6 mEq/L (ref 3.5–5.1)
Sodium: 141 mEq/L (ref 135–145)

## 2011-08-13 LAB — DIFFERENTIAL
Basophils Absolute: 0 10*3/uL (ref 0.0–0.1)
Lymphs Abs: 0.7 10*3/uL (ref 0.7–4.0)
Monocytes Absolute: 0.6 10*3/uL (ref 0.1–1.0)

## 2011-08-13 LAB — URINE MICROSCOPIC-ADD ON

## 2011-08-13 LAB — URINALYSIS, ROUTINE W REFLEX MICROSCOPIC
Bilirubin Urine: NEGATIVE
Glucose, UA: NEGATIVE mg/dL
Ketones, ur: NEGATIVE mg/dL
Leukocytes, UA: NEGATIVE
Nitrite: NEGATIVE
Protein, ur: NEGATIVE mg/dL
Specific Gravity, Urine: 1.018 (ref 1.005–1.030)
Urobilinogen, UA: 1 mg/dL (ref 0.0–1.0)
pH: 6 (ref 5.0–8.0)

## 2011-08-13 LAB — PREPARE RBC (CROSSMATCH)

## 2011-08-13 LAB — ABO/RH: ABO/RH(D): A NEG

## 2011-08-13 LAB — HEMOGLOBIN AND HEMATOCRIT, BLOOD
HCT: 20.9 % — ABNORMAL LOW (ref 36.0–46.0)
Hemoglobin: 7.3 g/dL — ABNORMAL LOW (ref 12.0–15.0)

## 2011-08-13 LAB — PROTIME-INR: INR: 1.26 (ref 0.00–1.49)

## 2011-08-13 MED ORDER — ACETAMINOPHEN 325 MG PO TABS
650.0000 mg | ORAL_TABLET | Freq: Four times a day (QID) | ORAL | Status: DC | PRN
  Filled 2011-08-13: qty 2

## 2011-08-13 MED ORDER — CALCIUM CARBONATE 600 MG PO TABS
600.0000 mg | ORAL_TABLET | Freq: Two times a day (BID) | ORAL | Status: DC
Start: 2011-08-13 — End: 2011-08-13
  Filled 2011-08-13 (×2): qty 1

## 2011-08-13 MED ORDER — FENTANYL 75 MCG/HR TD PT72
75.0000 ug | MEDICATED_PATCH | TRANSDERMAL | Status: DC
  Administered 2011-08-13 – 2011-08-15 (×2): 75 ug via TRANSDERMAL
  Filled 2011-08-13 (×2): qty 1

## 2011-08-13 MED ORDER — DIPHENHYDRAMINE HCL 25 MG PO CAPS
25.0000 mg | ORAL_CAPSULE | Freq: Once | ORAL | Status: AC
  Administered 2011-08-13: 25 mg via ORAL
  Filled 2011-08-13: qty 1

## 2011-08-13 MED ORDER — DIPHENHYDRAMINE HCL 50 MG/ML IJ SOLN
25.0000 mg | Freq: Once | INTRAMUSCULAR | Status: AC
  Administered 2011-08-13: 25 mg via INTRAVENOUS
  Filled 2011-08-13: qty 1

## 2011-08-13 MED ORDER — CALCIUM CARBONATE 1250 (500 CA) MG PO TABS
500.0000 mg | ORAL_TABLET | Freq: Two times a day (BID) | ORAL | Status: DC
  Administered 2011-08-13 – 2011-08-14 (×4): 500 mg via ORAL
  Filled 2011-08-13 (×6): qty 1

## 2011-08-13 MED ORDER — DULOXETINE HCL 60 MG PO CPEP
60.0000 mg | ORAL_CAPSULE | Freq: Every day | ORAL | Status: DC
  Administered 2011-08-13 – 2011-08-19 (×7): 60 mg via ORAL
  Filled 2011-08-13 (×7): qty 1

## 2011-08-13 MED ORDER — ONDANSETRON HCL 4 MG PO TABS: 4.0000 mg | ORAL_TABLET | Freq: Four times a day (QID) | ORAL | Status: DC | PRN

## 2011-08-13 MED ORDER — ALPRAZOLAM 0.25 MG PO TABS
0.2500 mg | ORAL_TABLET | Freq: Three times a day (TID) | ORAL | Status: DC | PRN
  Administered 2011-08-13 – 2011-08-19 (×7): 0.25 mg via ORAL
  Filled 2011-08-13 (×7): qty 1

## 2011-08-13 MED ORDER — DEXTROSE 5 % IV SOLN
5.0000 mg | Freq: Once | INTRAVENOUS | Status: AC
  Administered 2011-08-13: 5 mg via INTRAVENOUS
  Filled 2011-08-13: qty 0.5

## 2011-08-13 MED ORDER — CIPROFLOXACIN HCL 500 MG PO TABS
500.0000 mg | ORAL_TABLET | Freq: Two times a day (BID) | ORAL | Status: DC
  Administered 2011-08-13 – 2011-08-19 (×13): 500 mg via ORAL
  Filled 2011-08-13 (×15): qty 1

## 2011-08-13 MED ORDER — ALUM & MAG HYDROXIDE-SIMETH 200-200-20 MG/5ML PO SUSP: 30.0000 mL | Freq: Four times a day (QID) | ORAL | Status: DC | PRN

## 2011-08-13 MED ORDER — GABAPENTIN 300 MG PO CAPS
300.0000 mg | ORAL_CAPSULE | Freq: Every day | ORAL | Status: DC
  Administered 2011-08-13 – 2011-08-19 (×7): 300 mg via ORAL
  Filled 2011-08-13 (×7): qty 1

## 2011-08-13 MED ORDER — ACETAMINOPHEN 650 MG RE SUPP: 650.0000 mg | Freq: Four times a day (QID) | RECTAL | Status: DC | PRN

## 2011-08-13 MED ORDER — TRAMADOL HCL 50 MG PO TABS
100.0000 mg | ORAL_TABLET | Freq: Three times a day (TID) | ORAL | Status: DC | PRN
  Administered 2011-08-13 (×2): 50 mg via ORAL
  Administered 2011-08-14: 100 mg via ORAL
  Filled 2011-08-13: qty 1
  Filled 2011-08-13: qty 2
  Filled 2011-08-13: qty 1

## 2011-08-13 MED ORDER — HYDROMORPHONE HCL PF 1 MG/ML IJ SOLN
0.5000 mg | INTRAMUSCULAR | Status: DC | PRN
  Administered 2011-08-14 – 2011-08-19 (×17): 0.5 mg via INTRAVENOUS
  Filled 2011-08-13 (×18): qty 1

## 2011-08-13 MED ORDER — CALCITONIN (SALMON) 200 UNIT/ACT NA SOLN
1.0000 | NASAL | Status: DC
  Administered 2011-08-13 – 2011-08-17 (×3): 1 via NASAL
  Filled 2011-08-13 (×2): qty 3.7

## 2011-08-13 MED ORDER — SODIUM CHLORIDE 0.9 % IV SOLN
INTRAVENOUS | Status: DC
  Administered 2011-08-13: 100 mL/h via INTRAVENOUS
  Administered 2011-08-13 – 2011-08-15 (×3): via INTRAVENOUS
  Administered 2011-08-16: 75 mL/h via INTRAVENOUS
  Administered 2011-08-17: 05:00:00 via INTRAVENOUS

## 2011-08-13 MED ORDER — SODIUM CHLORIDE 0.9 % IV BOLUS (SEPSIS): 500.0000 mL | Freq: Once | INTRAVENOUS | Status: DC

## 2011-08-13 MED ORDER — ONDANSETRON HCL 4 MG/2ML IJ SOLN: 4.0000 mg | Freq: Four times a day (QID) | INTRAMUSCULAR | Status: DC | PRN

## 2011-08-13 MED ORDER — ACETAMINOPHEN 325 MG PO TABS
650.0000 mg | ORAL_TABLET | Freq: Once | ORAL | Status: AC
  Administered 2011-08-13: 650 mg via ORAL
  Filled 2011-08-13: qty 2

## 2011-08-13 MED ORDER — VITAMIN D (ERGOCALCIFEROL) 1.25 MG (50000 UNIT) PO CAPS
50000.0000 [IU] | ORAL_CAPSULE | ORAL | Status: DC
  Administered 2011-08-13: 50000 [IU] via ORAL
  Filled 2011-08-13: qty 1

## 2011-08-13 MED ORDER — MOMETASONE FURO-FORMOTEROL FUM 100-5 MCG/ACT IN AERO
2.0000 | INHALATION_SPRAY | Freq: Two times a day (BID) | RESPIRATORY_TRACT | Status: DC
  Administered 2011-08-13 – 2011-08-19 (×11): 2 via RESPIRATORY_TRACT
  Filled 2011-08-13: qty 13

## 2011-08-13 MED ORDER — ACETAMINOPHEN 325 MG PO TABS
650.0000 mg | ORAL_TABLET | Freq: Once | ORAL | Status: AC
  Administered 2011-08-13: 650 mg via ORAL

## 2011-08-13 MED ORDER — PANTOPRAZOLE SODIUM 40 MG IV SOLR
40.0000 mg | Freq: Two times a day (BID) | INTRAVENOUS | Status: DC
  Administered 2011-08-13 – 2011-08-18 (×13): 40 mg via INTRAVENOUS
  Filled 2011-08-13 (×15): qty 40

## 2011-08-13 MED ORDER — HYDROCORTISONE SOD SUCCINATE 100 MG IJ SOLR
100.0000 mg | Freq: Three times a day (TID) | INTRAMUSCULAR | Status: DC
  Administered 2011-08-13 – 2011-08-17 (×14): 100 mg via INTRAVENOUS
  Filled 2011-08-13 (×16): qty 2

## 2011-08-13 NOTE — ED Notes (Signed)
Pt was started on FFP at 01:50 on 08/13/11 with a temp. Of 98.9.  At 02:05 pt's temp was 100.2.  FFP administration was immediately stopped and EDP was notified.  Tubing was disconnected and NS was given. Pt was administered tylenol and benadryl per order and protocol. Admitting provider was notified of reaction and interventions.  Admitting provider order for the FFP to be restarted and if the temp exceeded 101.5 stop the administration.  Pt was moved to resuscitation room. Hospital [facility] notified of situation and is trying to get bed in stepdown ICU.

## 2011-08-13 NOTE — Consult Note (Signed)
Reason for Consult:  GASTROENTEROLOGY CONSULT Referring Physician: Dr [name]   GI Dr Matthias Hughs  Katherine STARKEY is an 70 y.o. female.  HPI: 70 year old woman who has a long history of Crohn's disease. This dates back to the mid 1960s at which point she underwent right hemicolectomy with subsequent ileocolonic surgery. Her Crohn's disease has been quiescent. She last saw Dr. Matthias Hughs in the office about 2 years ago and was asymptomatic regarding her Crohn's disease. In fact she was complaining of constipation and had to be on MiraLAX. She has not been on mesalamine for a number of years. Her last colonoscopy was performed by Dr. Matthias Hughs 8/05. There was no active Crohn's in her small bowel which was endoscopically normal. There was no sign of anastomotic stricture or active Crohn's. Small bowel biopsy and colonic biopsies were normal. The patient reports that she has not really had much in the way of diarrhea or abdominal pain. Her bowel movements have been stable until about 2 weeks ago when she began to have stools that were somewhat looser. 2-3 days ago she was treated with Cipro for urinary tract infection and later she believes that day was awakened with severe abdominal pain and diarrhea. It appears that she actually woke up because of her chronic neuropathy pain and noticed that she was having abdominal pain and diarrhea. It was nonbloody. She was found to have a hemoglobin of 8.3 in the ER yesterday and an INR elevated at 5.6. She is chronically anticoagulated on Coumadin because of a prior history of DVT and pulmonary emboli. She denies any known history of bleeding ulcers. She is on chronic narcotic patches for chronic neuropathy pain associated with that. She denies any NSAID use. The only other medication that has been changed recently has the addition of gabapentin for chronic neuropathy pain. The patient had a CT of the abdomen done without contrast at this admission. It showed that she was  postcholecystectomy. The colon was mildly dilated and there was some circumferential wall thickening in the right lower quadrant. This could have been Crohn's or could have ischemic. There was inflammatory changes in the fat. There was no obstruction. Since her admission last night she has not had any further gross bleeding.   Past Medical History  Diagnosis Date  . Polio   . Osteoporosis     multiple compression fractures  . Crohn's disease     since 1966 on chronic steroids s/p ileal resection, fistula repair  . Brain tumor   . Migraines   . Hypercholesteremia   . Hypertension   . GERD (gastroesophageal reflux disease)   . Anxiety   . Depression   . Emphysema   . Macular degeneration   . Cataract   . Arthritis   . Pernicious anemia   . Primary adrenal deficiency   . COPD (chronic obstructive pulmonary disease)     >Anticoagulated due to histo of the DVT   Past Surgical History  Procedure Date  . Cholecystectomy   . Cataract repair   . Ileal resection     >Probable previous rt colectomy with Crohns surgery, and  No family history on file.  Social History:  reports that she has never smoked. She does not have any smokeless tobacco history on file. She reports that she does not drink alcohol. Her drug history not on file.  Allergies:  Allergies  Allergen Reactions  . Iohexol      Code: HIVES, Desc: pt states she breaks out in hives  and sneezes 10/01/08   Desc: STATES SHE ONLY REACTS BY SNEEZING   . Penicillins Rash    Medications: reviewed   Results for orders placed during the hospital encounter of 08/12/11 (from the past 48 hour(s))  OCCULT BLOOD, POC DEVICE     Status: Normal   Collection Time   08/12/11  9:26 PM      Component Value Range Comment   Fecal Occult Bld POSITIVE     CBC     Status: Abnormal   Collection Time   08/12/11 10:30 PM      Component Value Range Comment   WBC 14.0 (*) 4.0 - 10.5 (K/uL)    RBC 2.63 (*) 3.87 - 5.11 (MIL/uL)     Hemoglobin 8.3 (*) 12.0 - 15.0 (g/dL)    HCT 19.1 (*) 47.8 - 46.0 (%)    MCV 96.2  78.0 - 100.0 (fL)    MCH 31.6  26.0 - 34.0 (pg)    MCHC 32.8  30.0 - 36.0 (g/dL)    RDW 29.5  62.1 - 30.8 (%)    Platelets 197  150 - 400 (K/uL)   DIFFERENTIAL     Status: Abnormal   Collection Time   08/12/11 10:30 PM      Component Value Range Comment   Neutrophils Relative 65  43 - 77 (%)    Neutro Abs 9.1 (*) 1.7 - 7.7 (K/uL)    Lymphocytes Relative 21  12 - 46 (%)    Lymphs Abs 3.0  0.7 - 4.0 (K/uL)    Monocytes Relative 13 (*) 3 - 12 (%)    Monocytes Absolute 1.9 (*) 0.1 - 1.0 (K/uL)    Eosinophils Relative 0  0 - 5 (%)    Eosinophils Absolute 0.1  0.0 - 0.7 (K/uL)    Basophils Relative 0  0 - 1 (%)    Basophils Absolute 0.0  0.0 - 0.1 (K/uL)   COMPREHENSIVE METABOLIC PANEL     Status: Abnormal   Collection Time   08/12/11 10:30 PM      Component Value Range Comment   Sodium 138  135 - 145 (mEq/L)    Potassium 3.3 (*) 3.5 - 5.1 (mEq/L)    Chloride 102  96 - 112 (mEq/L)    CO2 31  19 - 32 (mEq/L)    Glucose, Bld 122 (*) 70 - 99 (mg/dL)    BUN 27 (*) 6 - 23 (mg/dL)    Creatinine, Ser 6.57  0.50 - 1.10 (mg/dL)    Calcium 8.2 (*) 8.4 - 10.5 (mg/dL)    Total Protein 4.9 (*) 6.0 - 8.3 (g/dL)    Albumin 2.5 (*) 3.5 - 5.2 (g/dL)    AST 10  0 - 37 (U/L)    ALT 6  0 - 35 (U/L)    Alkaline Phosphatase 42  39 - 117 (U/L)    Total Bilirubin 0.4  0.3 - 1.2 (mg/dL)    GFR calc non Af Amer 56 (*) >90 (mL/min)    GFR calc Af Amer 65 (*) >90 (mL/min)   LIPASE, BLOOD     Status: Abnormal   Collection Time   08/12/11 10:30 PM      Component Value Range Comment   Lipase 8 (*) 11 - 59 (U/L)   LACTIC ACID, PLASMA     Status: Normal   Collection Time   08/12/11 10:30 PM      Component Value Range Comment   Lactic Acid, Venous 1.4  0.5 -  2.2 (mmol/L)   APTT     Status: Abnormal   Collection Time   08/12/11 10:30 PM      Component Value Range Comment   aPTT 93 (*) 24 - 37 (seconds)   PROTIME-INR      Status: Abnormal   Collection Time   08/12/11 10:30 PM      Component Value Range Comment   Prothrombin Time 51.5 (*) 11.6 - 15.2 (seconds) REPEATED TO VERIFY   INR 5.61 (*) 0.00 - 1.49    ABO/RH     Status: Normal   Collection Time   08/12/11 11:27 PM      Component Value Range Comment   ABO/RH(D) A NEG     TYPE AND SCREEN     Status: Normal (Preliminary result)   Collection Time   08/12/11 11:47 PM      Component Value Range Comment   ABO/RH(D) A NEG      Antibody Screen NEG      Sample Expiration 08/15/2011      Unit Number [id number]      Blood Component Type RBC LR PHER1      Unit division 00      Status of Unit ISSUED      Transfusion Status OK TO TRANSFUSE      Crossmatch Result Compatible      Unit Number [id number]      Blood Component Type RBC LR PHER1      Unit division 00      Status of Unit ISSUED      Transfusion Status OK TO TRANSFUSE      Crossmatch Result Compatible      Unit Number [id number]      Blood Component Type RED CELLS,LR      Unit division 00      Status of Unit ALLOCATED      Transfusion Status OK TO TRANSFUSE      Crossmatch Result Compatible      Unit Number [id number]      Blood Component Type RBC LR PHER1      Unit division 00      Status of Unit ALLOCATED      Transfusion Status OK TO TRANSFUSE      Crossmatch Result Compatible     PREPARE RBC (CROSSMATCH)     Status: Normal   Collection Time   08/13/11 12:30 AM      Component Value Range Comment   Order Confirmation ORDER PROCESSED BY BLOOD BANK     URINALYSIS, ROUTINE W REFLEX MICROSCOPIC     Status: Abnormal   Collection Time   08/13/11  1:25 AM      Component Value Range Comment   Color, Urine YELLOW  YELLOW     APPearance CLEAR  CLEAR     Specific Gravity, Urine 1.018  1.005 - 1.030     pH 6.0  5.0 - 8.0     Glucose, UA NEGATIVE  NEGATIVE (mg/dL)    Hgb urine dipstick LARGE (*) NEGATIVE     Bilirubin Urine NEGATIVE  NEGATIVE     Ketones, ur NEGATIVE  NEGATIVE (mg/dL)     Protein, ur NEGATIVE  NEGATIVE (mg/dL)    Urobilinogen, UA 1.0  0.0 - 1.0 (mg/dL)    Nitrite NEGATIVE  NEGATIVE     Leukocytes, UA NEGATIVE  NEGATIVE    URINE MICROSCOPIC-ADD ON     Status: Abnormal   Collection Time   08/13/11  1:25 AM  Component Value Range Comment   Squamous Epithelial / LPF FEW (*) RARE     WBC, UA 0-2  <3 (WBC/hpf)    RBC / HPF 21-50  <3 (RBC/hpf)   PREPARE FRESH FROZEN PLASMA     Status: Normal (Preliminary result)   Collection Time   08/13/11  1:30 AM      Component Value Range Comment   Unit Number [id number]      Blood Component Type THAWED PLASMA      Unit division 00      Status of Unit ISSUED      Transfusion Status OK TO TRANSFUSE      Unit Number [id number]      Blood Component Type THAWED PLASMA      Unit division 00      Status of Unit ISSUED      Transfusion Status OK TO TRANSFUSE     MRSA PCR SCREENING     Status: Normal   Collection Time   08/13/11  4:21 AM      Component Value Range Comment   MRSA by PCR NEGATIVE  NEGATIVE    BASIC METABOLIC PANEL     Status: Abnormal   Collection Time   08/13/11 11:05 AM      Component Value Range Comment   Sodium 141  135 - 145 (mEq/L)    Potassium 3.6  3.5 - 5.1 (mEq/L)    Chloride 107  96 - 112 (mEq/L)    CO2 29  19 - 32 (mEq/L)    Glucose, Bld 113 (*) 70 - 99 (mg/dL)    BUN 17  6 - 23 (mg/dL)    Creatinine, Ser 2.13  0.50 - 1.10 (mg/dL)    Calcium 7.3 (*) 8.4 - 10.5 (mg/dL)    GFR calc non Af Amer 65 (*) >90 (mL/min)    GFR calc Af Amer 75 (*) >90 (mL/min)   CBC     Status: Abnormal   Collection Time   08/13/11 11:05 AM      Component Value Range Comment   WBC 10.1  4.0 - 10.5 (K/uL)    RBC 2.77 (*) 3.87 - 5.11 (MIL/uL)    Hemoglobin 8.6 (*) 12.0 - 15.0 (g/dL)    HCT 08.6 (*) 57.8 - 46.0 (%)    MCV 89.9  78.0 - 100.0 (fL)    MCH 31.0  26.0 - 34.0 (pg)    MCHC 34.5  30.0 - 36.0 (g/dL)    RDW 46.9 (*) 62.9 - 15.5 (%)    Platelets 139 (*) 150 - 400 (K/uL)   HEMOGLOBIN AND HEMATOCRIT,  BLOOD     Status: Abnormal   Collection Time   08/13/11  4:44 PM      Component Value Range Comment   Hemoglobin 7.3 (*) 12.0 - 15.0 (g/dL)    HCT 52.8 (*) 41.3 - 46.0 (%)   PROTIME-INR     Status: Abnormal   Collection Time   08/13/11  4:45 PM      Component Value Range Comment   Prothrombin Time 16.1 (*) 11.6 - 15.2 (seconds)    INR 1.26  0.00 - 1.49      Ct Abdomen Pelvis Wo Contrast  08/13/2011  **ADDENDUM** CREATED: 08/13/2011 11:40:32  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 Critical Value/emergent results were called by telephone at the time of interpretation on 08/13/2011  at 1145 hours  to  the patient's nurse Victorino Dike, who verbally acknowledged these results.  **  END ADDENDUM** SIGNED BY: Gerrianne Scale, M.D.   08/13/2011  *RADIOLOGY REPORT*  Clinical Data: Rectal bleeding with nausea, diarrhea and abdominal pain.  History of Crohn's disease.  Contrast allergy.  CT ABDOMEN AND PELVIS WITHOUT CONTRAST  Technique:  Multidetector CT imaging of the abdomen and pelvis was performed following the standard protocol without intravenous contrast.  Comparison: Small bowel series 01/27/2009. Lumbar MRI 03/31/2009.No previous CT.  Findings: Emphysematous changes are present in both lung bases. There is mild central airway thickening in the right lower lobe. There is a small right pleural effusion.  There is a moderate amount of ascites with most of the fluid adjacent to the liver and within the pelvis.  This fluid measures slightly higher than urine in the bladder.  No layering fluid-fluid levels are identified.  The administered enteric contrast has passed into the proximal and mid colon.  The colon is mildly dilated but shows no discrete wall thickening.  There are multiple loops of distal small bowel which demonstrate circumferential wall thickening in the right lower quadrant.  There is diffuse edema within the surrounding mesenteric fat.  This process appears to extend  to the terminal ileum.  The appendix is not clearly visualized.  There is no well-defined focal fluid collection.  There appears to be diffuse biliary dilatation status post cholecystectomy.  The common bile duct appears dilated to the ampulla.  No calcified stones are seen within the common duct. There is no evidence of focal liver lesion on noncontrast imaging. The spleen and adrenal glands appear unremarkable.  The pancreas is atrophied.  There are scattered calyceal calculi in both kidneys. No hydronephrosis is present.  There are diffuse vascular calcifications.  The urinary bladder is partially decompressed by Foley catheter, accounting for air within the bladder lumen.  The uterus appears mildly atrophied.  Schmorl's nodes involving the superior plate of L2 and the inferior endplate of L5 are stable.  There are compression deformities at T11 and T12 which are not well seen on the prior studies but do not appear acute.  IMPRESSION:  1.  Diffuse wall thickening of the distal small bowel is likely related to active Crohn's disease given the patient's history. Ischemic bowel could have a similar appearance. 2.  There is diffuse inflammatory change in the surrounding fat with a moderate amount of free peritoneal fluid.  This fluid measures higher than water density.  No focal fluid collections are identified. 3.  No evidence of high-grade bowel obstruction or extravasated enteric contrast. 4.  Moderate biliary dilatation status post cholecystectomy. 5.  Probable chronic thoracolumbar compression deformities, some not well visualized on prior studies. Per CMS PQRS reporting requirements (PQRS Measure 24): Given the patient's age of greater than 50 and the fracture site (hip, distal radius, or spine), the patient should be tested for osteoporosis using DXA, and the appropriate treatment considered based on the DXA results.  Original Report Authenticated By: Gerrianne Scale, M.D.    ROS: Cardiac: Patient denies  chest pain shortness of breath or palpitations. Pulmonary: No coughing. GI: No change in bowel habits until past few days as noted above.   Blood pressure 94/45, pulse 81, temperature 100.6 F (38.1 C), temperature source Core (Comment), resp. rate 21, height 5\' 3"  (1.6 m), weight 54.8 kg (120 lb 13 oz), SpO2 98.00%.  Physical exam:  general-pale white female in no acute distress. Eyes-sclerae are nonicteric extraocular movements intact. Lungs-clear. Heart-regular rate and rhythm. Abdomen-nondistended and soft with mild tenderness in the  periumbilical area. Bowel sounds are present.  Assessment/Plan: 1. acute abdominal pain and diarrhea. CT scan shows diffuse thickening of the distal small bowel. This could be Crohn's disease but her presentation is more consistent with ischemia given the acute onset. Her recent bleeding is clearly exacerbated by her elevated INR. She reports that the pain is better. Her INR is improved dramatically after transfusion of FFP. Her hemoglobin is 7.3 without obvious bleeding. She will likely need colonoscopy and endoscopic evaluation at this area to distinguish between ischemia or active Crohn's disease when her INR is fully corrected and she is stable.  2.  anticoagulated due to prior history of DVT  3.  Crohn's disease status post ileal resection and right hemicolectomy in the distant past with last colonoscopy 7 or 8 years ago showing no active disease. Patient has not been on any therapy for Crohn's for a number of years. 4.  status post cholecystectomy  5.  chronic neuropathy causing pain   At this point we will follow her clinically. Unable to explain her hemoglobin has dropped in the face of no active bleeding. We'll go ahead and repeat CBC in a couple of hours just to determine if she she was truly has dropped her hemoglobin. We will ahead and give her ice chips. If she is improved tomorrow, we can plan on a colonoscopy later this week.  Roshanna Cimino JR,Leland Staszewski  L 08/13/2011, 5:12 PM

## 2011-08-13 NOTE — Plan of Care (Signed)
Spoke to CT department at 0900 to inquire about time of abdominal scan for today. Due to limited space, best time available will be at 1100 this am. Spoke to patient and made her aware of time of procedure.

## 2011-08-13 NOTE — H&P (Signed)
PCP:  Adrian Prince, MD  Chief Complaint:  Blood in stool  HPI: Katherine Mckay is a 70 year old white female with a history of Crohn's disease since 1966 status post ileal resection in prior fistula repair well-controlled on chronic steroids, adrenal insufficiency, and history of recurrent pulmonary embolism on Coumadin therapy who presented to the emergency department with with a complaint of abdominal pain and dark bloody stools. Patient states that she was in her usual state of health until about 2 days ago when she felt like she was developing a urinary tract infection. She was started on Cipro at that time. Yesterday at around 4 AM she developed severe crampy abdominal pain followed by multiple episodes of diarrhea. She states that her stools have become dark black with some blood-tinge.  The persistence of abdominal pain and diarrhea she is in a to the emergency department where her hemoglobin is 8.3. Her INR is elevated at 5.61. Day sitter prior INR was normal 2 weeks ago. Other than the Cipro the only new medication is Gralise (gabapentin).  She denies any NSAID use.  She states that her Crohn's has been under excellent control for many years.  Her last EGD and colonoscopy were about 5 years ago. [name] been maintained on prednisone 5 mg daily since then. She has not feel that her current symptoms are consistent with her prior Crohn's flares.    Review of Systems:  Review of Systems - all systems were reviewed with the patient and are negative except in history of present illness with following exceptions: Chronic neck and facial pain consistent with trigeminal neuralgia. She was recently started on Gralise to treat this neuropathic pain. Chronic back and leg pain as well.  States her blood pressures been well-controlled at home. Denies any breathing issues recently. All other systems negative   Past Medical History: Past Medical History  Diagnosis Date  . Polio   . Osteoporosis     multiple  compression fractures  . Crohn's disease     since 1966 on chronic steroids s/p ileal resection, fistula repair  . Brain tumor   . Migraines   . Hypercholesteremia   . Hypertension   . GERD (gastroesophageal reflux disease)   . Anxiety   . Depression   . Emphysema   . Macular degeneration   . Cataract   . Arthritis   . Pernicious anemia   . Primary adrenal deficiency   . COPD (chronic obstructive pulmonary disease)    Past Surgical History  Procedure Date  . Cholecystectomy   . Cataract repair   . Ileal resection     Medications: Prior to Admission medications   Medication Sig Start Date End Date Taking? Authorizing Provider  ALPRAZolam (XANAX) 0.25 MG tablet Take 0.25 mg by mouth 3 (three) times daily.     Yes Historical Provider, MD  amLODipine (NORVASC) 2.5 MG tablet Take 2.5 mg by mouth daily.     Yes Historical Provider, MD  calcitonin, salmon, (MIACALCIN/FORTICAL) 200 UNIT/ACT nasal spray Place 1 spray into the nose every other day.     Yes Historical Provider, MD  calcium carbonate (OS-CAL) 600 MG TABS Take 600 mg by mouth 2 (two) times daily with a meal.     Yes Historical Provider, MD  ciprofloxacin (CIPRO) 500 MG tablet Take 500 mg by mouth 2 (two) times daily.     Yes Historical Provider, MD  DULoxetine (CYMBALTA) 60 MG capsule Take 60 mg by mouth daily.     Yes Historical  Provider, MD  fentaNYL (DURAGESIC - DOSED MCG/HR) 75 MCG/HR Place 1 patch onto the skin every other day.     Yes Historical Provider, MD  Gabapentin, PHN, (GRALISE) 300 MG TABS Take 1 tablet by mouth daily.     Yes Historical Provider, MD  mometasone-formoterol (DULERA) 100-5 MCG/ACT AERO Inhale 2 puffs into the lungs 2 (two) times daily.     Yes Historical Provider, MD  nadolol (CORGARD) 20 MG tablet Take 20 mg by mouth daily.     Yes Historical Provider, MD  polyethylene glycol (MIRALAX / GLYCOLAX) packet Take 17 g by mouth daily as needed. constipation    Yes Historical Provider, MD  potassium  chloride (KLOR-CON) 10 MEQ CR tablet Take 5-10 mEq by mouth as directed. Takes 1 tablet daily Monday through Friday for dosage and takes 1/2 tablet Saturday and Sunday for dosage    Yes Historical Provider, MD  predniSONE (DELTASONE) 5 MG tablet Take 5 mg by mouth 2 (two) times daily.     Yes Historical Provider, MD  ranitidine (ZANTAC) 300 MG capsule Take 300 mg by mouth 2 (two) times daily.     Yes Historical Provider, MD  simvastatin (ZOCOR) 80 MG tablet Take 80 mg by mouth daily.     Yes Historical Provider, MD  traMADol (ULTRAM) 50 MG tablet Take 100 mg by mouth every 8 (eight) hours as needed. Pain.Marland KitchenMarland KitchenMaximum dose= 8 tablets per day    Yes Historical Provider, MD  Vitamin D, Ergocalciferol, (DRISDOL) 50000 UNITS CAPS Take 50,000 Units by mouth every 7 (seven) days.     Yes Historical Provider, MD  warfarin (COUMADIN) 5 MG tablet Take 5 mg by mouth 2 (two) times daily.     Yes Historical Provider, MD    Allergies:   Allergies  Allergen Reactions  . Iohexol      Code: HIVES, Desc: pt states she breaks out in hives and sneezes 10/01/08   Desc: STATES SHE ONLY REACTS BY SNEEZING   . Penicillins Rash    Social History: Married. Smokes 1 ppd.  Social ETOH.  Family History: Mother- died CAD age 22.  Father died MVA age 37.  Physical Exam: Filed Vitals:   08/12/11 1852 08/12/11 2003 08/12/11 2344  BP: 119/60 111/59 123/93  Pulse: 95 92 91  Temp: 97.9 F (36.6 C) 98.3 F (36.8 C)   TempSrc: Oral Oral   Resp: 19 28 22   SpO2: 96% 94%    General appearance: no distress and pale appearing Head: Normocephalic, without obvious abnormality, atraumatic Eyes: conjunctivae/corneas clear. PERRL, EOM's intact. Scleral pallor.  Nose: Nares normal. Septum midline. Mucosa normal. No drainage or sinus tenderness. Throat: lips, mucosa, and tongue normal; teeth and gums normal Neck: no adenopathy, no carotid bruit, no JVD and thyroid not enlarged, symmetric, no  tenderness/mass/nodules Resp: clear to auscultation bilaterally Cardio: regular rate and rhythm GI: soft, non-distended with normal active bowel sounds. Diffuse tenderness most prominent in the epigastrium. No rebound or guarding.  Extremities: extremities normal, atraumatic, no cyanosis or edema; severe kyphosis Pulses: 2+ and symmetric Lymph nodes: Cervical adenopathy: no cervical lymphadenopathy Neurologic: Alert and oriented X 3, normal strength and tone. Normal symmetric reflexes.     Labs on Admission:   [facility] 08/12/11 2230  NA 138  K 3.3*  CL 102  CO2 31  GLUCOSE 122*  BUN 27*  CREATININE 1.01  CALCIUM 8.2*  MG --  PHOS --    Basename 08/12/11 2230  AST 10  ALT 6  ALKPHOS 42  BILITOT 0.4  PROT 4.9*  ALBUMIN 2.5*    Basename 08/12/11 2230  LIPASE 8*  AMYLASE --    Basename 08/12/11 2230  WBC 14.0*  NEUTROABS 9.1*  HGB 8.3*  HCT 25.3*  MCV 96.2  PLT 197   No results found for this basename: CKTOTAL:3,CKMB:3,CKMBINDEX:3,TROPONINI:3 in the last 72 hours Lab Results  Component Value Date   INR 5.61* 08/12/2011   Lactic Acid 1.4   Radiological Exams on Admission: No results found. No orders found for this or any previous visit.  Assessment/Plan  Principal Problem: 1. *GI bleed- acute onset abdominal pain followed by dark stools/diarrhea is concerning for upper GI bleed versus bowel ischemia. CT scan of the abdomen and pelvis without contrast has been ordered and should help to clarify the cause of her abdominal pain.  Will admit to step down unit for serial hemoglobin/hematocrit, blood transfusion (2 units packed red blood cells has already been ordered), reversal of coagulopathy with vitamin K and FFP, and close hemodynamic monitoring. [facility] consult gastroenterology to consider endoscopic evaluation pending CT results.  Will start on PPI twice a day given high risk for peptic ulcer disease on chronic steroid therapy.  Active Problems: 2.  Abdominal pain- acute onset abdominal pain may be related to peptic ulcer disease versus bowel ischemia.  Less likely is an acute Crohn's flare.  CT scan can should help clarify. 3. Supratherapeutic INR secondary to Coumadin therapy- we'll reverse with vitamin K 5 mg IV x1 and FFP. Check post transfusion INR. 4. Diarrhea- likely secondary to acute blood loss. 5. Adrenal insufficiency- we'll place on stress dose Solu-Cortef given chronic steroid use. 6. Crohn's disease- this has been quiescent for many years on low-dose steroids. CT scan should help to evaluate for active Crohn's. 7. Anemia due to blood loss, acute- we'll transfuse 2 units packed red blood cells and monitor posttransfusion hemoglobin and hematocrit. Transfuse to keep hemoglobin above 8.0.  8. History of recurrent pulmonary embolism- she is at high risk for recurrent PEs with reversal of her anticoagulation. Will place on SCDs for DVT prophylaxis. May need to consider IVC filter if prolonged need for Coumadin discontinuation. 9. Chronic pain-we'll continue fentanyl patch and used Dilaudid as needed for breakthrough pain. 10. Disposition-anticipate discharge to home when she is stable.   Oneta Sigman,W DOUGLAS 08/13/2011, 12:36 AM

## 2011-08-13 NOTE — Progress Notes (Addendum)
Subjective: Feels somewhat better. Breathing is less difficult. No n/v. Less abdominal pain. No CP  Objective: Vital signs in last 24 hours: Temp:  [97.9 F (36.6 C)-101.3 F (38.5 C)] 100.6 F (38.1 C) (12/31 0705) Pulse Rate:  [58-95] 81  (12/31 0705) Resp:  [17-28] 21  (12/31 0705) BP: (79-138)/(29-93) 97/45 mmHg (12/31 0705) SpO2:  [74 %-100 %] 100 % (12/31 0630) FiO2 (%):  [4 %] 4 % (12/31 0535) Weight:  [54.8 kg (120 lb 13 oz)] 120 lb 13 oz (54.8 kg) (12/31 0517)  Intake/Output from previous day: 12/30 0701 - 12/31 0700 In: 1527 [I.V.:1300; Blood:227] Out: -  Intake/Output this shift: Total I/O In: 350 [Blood:350] Out: 475 [Urine:475]  General: pale and frail appearing. Face symmetric, oral membranes moist. Neck supple. Lungs: dimished, no wheeze. Ht Regular with sys murmur. Abd moderately distended, hypoactive BS"s Alert, awake, clear low volume speech, mentating well. Quite weak  Lab Results   [facility] 08/12/11 2230  WBC 14.0*  RBC 2.63*  HGB 8.3*  HCT 25.3*  MCV 96.2  MCH 31.6  RDW 15.4  PLT 197    Basename 08/12/11 2230  NA 138  K 3.3*  CL 102  CO2 31  GLUCOSE 122*  BUN 27*  CREATININE 1.01  CALCIUM 8.2*    Studies/Results: No results found.  Scheduled Meds:   . sodium chloride   Intravenous Once  . acetaminophen  650 mg Oral Once  . acetaminophen  650 mg Oral Once  . calcitonin (salmon)  1 spray Alternating Nares QODAY  . calcium carbonate  500 mg of elemental calcium Oral BID WC  . ciprofloxacin  500 mg Oral BID  . diphenhydrAMINE  25 mg Intravenous Once  . diphenhydrAMINE  25 mg Intravenous Once  . DULoxetine  60 mg Oral Daily  . fentaNYL  75 mcg Transdermal QODAY  . gabapentin  300 mg Oral Daily  . hydrocortisone sod succinate (SOLU-CORTEF) injection  100 mg Intravenous Q8H  . mometasone-formoterol  2 puff Inhalation BID  . pantoprazole (PROTONIX) IV  40 mg Intravenous Q12H  . phytonadione (VITAMIN K) IV  5 mg Intravenous Once  .  sodium chloride  500 mL Intravenous Once  . sodium chloride  500 mL Intravenous Once  . Vitamin D (Ergocalciferol)  50,000 Units Oral Q7 days  . DISCONTD: calcium carbonate  600 mg Oral BID WC   Continuous Infusions:   . sodium chloride 100 mL/hr at 08/13/11 0600   PRN Meds:acetaminophen, acetaminophen, ALPRAZolam, alum & mag hydroxide-simeth, HYDROmorphone, ondansetron (ZOFRAN) IV, ondansetron, traMADol, DISCONTD: fentaNYL  Assessment/Plan: Patient Active Problem List  Diagnoses  . GI bleed: Just received 2nd unit of blood. Hasn't gotten FFP due to fever spike in the ER. To try again. Repeat labs pending. Pt more comfortable. ? Ischemic colitis or mesenteric ischemia OR return of Crohns. CT not completed yet. GI to see  . Abdominal pain: GI to see  . Supratherapeutic INR: Got vit K, now to get FFP  . Diarrhea  . Adrenal insufficiency: on stress steroids  . Crohn's disease:? flared  . Anemia due to blood loss, acute: see above  . Chronic pain: on fentanyl patch  . Recurrent pulmonary embolism: need to watch for issues as we reverse anticoagulation DM 2: BS's reasonable even with stress and steroids Hypokalemia: check again Fever: On PO cipro. ? Blood product related elevation Hematuria: ? Just due to over-anticoagulation or hemorrhagic cystitis. Rx Cipro should cover     LOS: 1 day  Sudeep Scheibel [name] 08/13/2011, 8:39 AM

## 2011-08-13 NOTE — Progress Notes (Signed)
Patient arrived in ICU/SD 1 unit of PRBC infusing with no reaction at present time.  RN, noticed that Foley was inserted before admission according to documentation, believes this is in error because foley is a temp foley.

## 2011-08-14 ENCOUNTER — Inpatient Hospital Stay (HOSPITAL_COMMUNITY): Payer: Medicare Other

## 2011-08-14 LAB — COMPREHENSIVE METABOLIC PANEL
BUN: 16 mg/dL (ref 6–23)
CO2: 25 mEq/L (ref 19–32)
Chloride: 107 mEq/L (ref 96–112)
Creatinine, Ser: 0.77 mg/dL (ref 0.50–1.10)
GFR calc Af Amer: >90 mL/min (ref >90)
GFR calc non Af Amer: 84 mL/min — ABNORMAL LOW (ref >90)
Total Bilirubin: 0.9 mg/dL (ref 0.3–1.2)

## 2011-08-14 LAB — CBC
Platelets: 99 10*3/uL — ABNORMAL LOW (ref 150–400)
RDW: 17 % — ABNORMAL HIGH (ref 11.5–15.5)
WBC: 8.9 10*3/uL (ref 4.0–10.5)

## 2011-08-14 LAB — PREPARE FRESH FROZEN PLASMA: Unit division: 0

## 2011-08-14 LAB — PROTIME-INR
INR: 1.06 (ref 0.00–1.49)
Prothrombin Time: 14 seconds (ref 11.6–15.2)

## 2011-08-14 NOTE — Progress Notes (Signed)
Pt ambulated in halls per MD order. Pt denies use of o2 at home. Prior to activity pt was satting 95% on 2 liters [place]. After walking in hall on [employer] with staff pt complained of feeling SOB and sats dropped to 85% Rm Air. Pt placed back on 2liters and sats are currently back up to 97%. [place], [employer]

## 2011-08-14 NOTE — Progress Notes (Signed)
Subjective: Feels better, but still weak. No stools since admission. Senses some abdominal bloating.  Objective: Vital signs in last 24 hours: Temp:  [98.4 F (36.9 C)-99.5 F (37.5 C)] 99.3 F (37.4 C) (01/01 1600) Pulse Rate:  [54-85] 54  (01/01 1600) Resp:  [16-28] 16  (01/01 1600) BP: (86-129)/(33-78) 123/53 mmHg (01/01 1600) SpO2:  [90 %-99 %] 90 % (01/01 1600) Weight change:  Last BM Date: 08/13/11  PE: GEN:  Chronically ill-appearing but not acutely toxic. ABD:  Bloated, but otherwise soft, hyperactive bowel sounds.  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 Critical Value/emergent results were called by telephone at the time of interpretation on 08/13/2011  at 1145 hours  to  the patient's nurse Victorino Dike, who verbally acknowledged these results.  **END ADDENDUM** SIGNED BY: Gerrianne Scale, M.D.   08/13/2011  *RADIOLOGY REPORT*  Clinical Data: Rectal bleeding with nausea, diarrhea and abdominal pain.  History of Crohn's disease.  Contrast allergy.  CT ABDOMEN AND PELVIS WITHOUT CONTRAST  Technique:  Multidetector CT imaging of the abdomen and pelvis was performed following the standard protocol without intravenous contrast.  Comparison: Small bowel series 01/27/2009. Lumbar MRI 03/31/2009.No previous CT.  Findings: Emphysematous changes are present in both lung bases. There is mild central airway thickening in the right lower lobe. There is a small right pleural effusion.  There is a moderate amount of ascites with most of the fluid adjacent to the liver and within the pelvis.  This fluid measures slightly higher than urine in the bladder.  No layering fluid-fluid levels are identified.  The administered enteric contrast has passed into the proximal and mid colon.  The colon is mildly dilated but shows no discrete wall thickening.  There  are multiple loops of distal small bowel which demonstrate circumferential wall thickening in the right lower quadrant.  There is diffuse edema within the surrounding mesenteric fat.  This process appears to extend to the terminal ileum.  The appendix is not clearly visualized.  There is no well-defined focal fluid collection.  There appears to be diffuse biliary dilatation status post cholecystectomy.  The common bile duct appears dilated to the ampulla.  No calcified stones are seen within the common duct. There is no evidence of focal liver lesion on noncontrast imaging. The spleen and adrenal glands appear unremarkable.  The pancreas is atrophied.  There are scattered calyceal calculi in both kidneys. No hydronephrosis is present.  There are diffuse vascular calcifications.  The urinary bladder is partially decompressed by Foley catheter, accounting for air within the bladder lumen.  The uterus appears mildly atrophied.  Schmorl's nodes involving the superior plate of L2 and the inferior endplate of L5 are stable.  There are compression deformities at T11 and T12 which are not well seen on the prior studies but do not appear acute.  IMPRESSION:  1.  Diffuse wall thickening of the distal small bowel is likely related to active Crohn's disease given the patient's history. Ischemic bowel could have a similar appearance. 2.  There is diffuse inflammatory change in the surrounding fat with a moderate amount of free peritoneal fluid.  This fluid measures higher than water density.  No focal fluid collections are identified. 3.  No evidence of high-grade bowel obstruction or extravasated enteric contrast. 4.  Moderate biliary dilatation status post cholecystectomy. 5.  Probable chronic thoracolumbar compression deformities, some  not well visualized on prior studies. Per CMS PQRS reporting requirements (PQRS Measure 24): Given the patient's age of greater than 50 and the fracture site (hip, distal radius, or spine), the  patient should be tested for osteoporosis using DXA, and the appropriate treatment considered based on the DXA results.  Original Report Authenticated By: Gerrianne Scale, M.D.   Dg Abd 1 View  08/14/2011  *RADIOLOGY REPORT*  Clinical Data: Constipation.  Diffuse abdominal pain.  PORTABLE ABDOMEN - 1 VIEW 08/14/2011 1149 hours:  Comparison: CT abdomen and pelvis yesterday.  Two-view abdomen x- ray 01/18/2009 [facility].  Findings: Contrast material within upper normal caliber ascending and transverse colon.  Gas within multiple upper normal caliber small bowel loops.  Less gaseous distention of the ileal loop in the upper pelvis since the CT yesterday.  No suggestion of free intraperitoneal air.  Surgical clips in the right upper quadrant from prior cholecystectomy.  IMPRESSION: Mild generalized ileus.  Original Report Authenticated By: Arnell Sieving, M.D.   Assessment:  1.  Blood in stool. 2.  Abnormal CT (distal small bowel thickening). 3.  Coagulopathy. 4.  History Crohn's disease.  Plan:  1.  Patient does not want her diet advanced today. 2.  She feels too weak and deconditioned to start a bowel prep today. 3.  Accordingly, will revisit tomorrow.  If she continues to feel progressively better, will consider bowel preparation tomorrow in anticipation for possible colonoscopy Thursday. 4.  Thank you for the consultation.   Freddy Jaksch 08/14/2011, 5:08 PM

## 2011-08-14 NOTE — Progress Notes (Signed)
Subjective: Admitted with GIB, presumed ischemic Colitis Vrs Crohns and Respiratory issues.  Doing better after reversal of coumadin and transfusions.  #s are better Feels somewhat better. Breathing is less difficult. No n/v. No abdominal pain. No CP. Now just bloated.  Objective: Vital signs in last 24 hours: Temp:  [98.4 F (36.9 C)-99.5 F (37.5 C)] 98.6 F (37 C) (01/01 0800) Pulse Rate:  [57-85] 57  (01/01 0800) Resp:  [16-28] 18  (01/01 0800) BP: (86-112)/(33-78) 111/46 mmHg (01/01 0800) SpO2:  [91 %-99 %] 95 % (01/01 0800)  Intake/Output from previous day: 12/31 0701 - 01/01 0700 In: 1898 [P.O.:300; I.V.:50; Blood:1548] Out: 1570 [Urine:1570] Intake/Output this shift:    General: pale and frail appearing. Face symmetric, oral membranes moist. Neck supple. Lungs: dimished, no wheeze. Ht Regular with sys murmur. Abd moderately distended, hypoactive BS"s Alert, awake, clear low volume speech, mentating well. 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 Critical Value/emergent results were called by telephone at the time of interpretation on 08/13/2011  at 1145 hours  to  the patient's nurse Victorino Dike, who verbally acknowledged these results.  **END ADDENDUM** SIGNED BY: Gerrianne Scale, M.D.   08/13/2011  *RADIOLOGY REPORT*  Clinical Data: Rectal bleeding with nausea, diarrhea and abdominal pain.  History of Crohn's disease.  Contrast allergy.  CT ABDOMEN AND  PELVIS WITHOUT CONTRAST  Technique:  Multidetector CT imaging of the abdomen and pelvis was performed following the standard protocol without intravenous contrast.  Comparison: Small bowel series 01/27/2009. Lumbar MRI 03/31/2009.No previous CT.  Findings: Emphysematous changes are present in both lung bases. There is mild central airway thickening in the right lower lobe. There is a small right pleural effusion.  There is a moderate amount of ascites with most of the fluid adjacent to the liver and within the pelvis.  This fluid measures slightly higher than urine in the bladder.  No layering fluid-fluid levels are identified.  The administered enteric contrast has passed into the proximal and mid colon.  The colon is mildly dilated but shows no discrete wall thickening.  There are multiple loops of distal small bowel which demonstrate circumferential wall thickening in the right lower quadrant.  There is diffuse edema within the surrounding mesenteric fat.  This process appears to extend to the terminal ileum.  The appendix is not clearly visualized.  There is no well-defined focal fluid collection.  There appears to be diffuse biliary dilatation status post cholecystectomy.  The common bile duct appears dilated to the ampulla.  No calcified stones are seen within the common duct. There is no evidence of focal liver lesion on noncontrast imaging. The spleen and adrenal glands appear unremarkable.  The pancreas is atrophied.  There are scattered calyceal calculi in both kidneys. No hydronephrosis is present.  There are diffuse vascular calcifications.  The urinary bladder  is partially decompressed by Foley catheter, accounting for air within the bladder lumen.  The uterus appears mildly atrophied.  Schmorl's nodes involving the superior plate of L2 and the inferior endplate of L5 are stable.  There are compression deformities at T11 and T12 which are not well seen on the prior studies but do not appear acute.   IMPRESSION:  1.  Diffuse wall thickening of the distal small bowel is likely related to active Crohn's disease given the patient's history. Ischemic bowel could have a similar appearance. 2.  There is diffuse inflammatory change in the surrounding fat with a moderate amount of free peritoneal fluid.  This fluid measures higher than water density.  No focal fluid collections are identified. 3.  No evidence of high-grade bowel obstruction or extravasated enteric contrast. 4.  Moderate biliary dilatation status post cholecystectomy. 5.  Probable chronic thoracolumbar compression deformities, some not well visualized on prior studies. Per CMS PQRS reporting requirements (PQRS Measure 24): Given the patient's age of greater than 50 and the fracture site (hip, distal radius, or spine), the patient should be tested for osteoporosis using DXA, and the appropriate treatment considered based on the DXA results.  Original Report Authenticated By: Gerrianne Scale, M.D.    Scheduled Meds:    . acetaminophen  650 mg Oral Once  . calcitonin (salmon)  1 spray Alternating Nares QODAY  . calcium carbonate  500 mg of elemental calcium Oral BID WC  . ciprofloxacin  500 mg Oral BID  . diphenhydrAMINE  25 mg Oral Once  . DULoxetine  60 mg Oral Daily  . fentaNYL  75 mcg Transdermal QODAY  . gabapentin  300 mg Oral Daily  . hydrocortisone sod succinate (SOLU-CORTEF) injection  100 mg Intravenous Q8H  . mometasone-formoterol  2 puff Inhalation BID  . pantoprazole (PROTONIX) IV  40 mg Intravenous Q12H  . sodium chloride  500 mL Intravenous Once  . Vitamin D (Ergocalciferol)  50,000 Units Oral Q7 days   Continuous Infusions:    . sodium chloride 100 mL/hr at 08/14/11 0600   PRN Meds:acetaminophen, acetaminophen, ALPRAZolam, alum & mag hydroxide-simeth, HYDROmorphone, ondansetron (ZOFRAN) IV, ondansetron, traMADol  Assessment/Plan: Patient Active Problem List  Diagnoses  . GI bleed: S/P  PRBCs and FFP ?Ischemic  colitis or mesenteric ischemia OR return of Crohns. See GI note.  Hbg currently stable 9.9 - 10.0  . Abdominal pain: S/P GI Eval for acute abdominal pain and diarrhea. CT scan shows diffuse thickening of the distal small bowel. This could be Crohn's disease but her presentation is more consistent with ischemia given the acute onset. Her recent bleeding is clearly exacerbated by her elevated INR. She reports that the pain is better. Her INR is improved dramatically after transfusion of FFP.  She will likely need colonoscopy and endoscopic evaluation at this area to distinguish between ischemia or active Crohn's disease when her INR is fully corrected and she is stable.  .    . Supratherapeutic INR: Got vit K, S/P FFP.  INR down to 1.06  . Diarrhea  . Adrenal insufficiency: on stress steroids  . Crohn's disease:? Flared.  See GI Note - Crohn's disease from the mid 1960s S/P R hemicolectomy with subsequent ileocolonic surgery.  Off mesalamine for a number of years. Her last colonoscopy was performed by Dr. Matthias Hughs 8/05. There was no active Crohn's in her small bowel which was endoscopically normal. There was no sign of anastomotic stricture or active Crohn's. Small bowel biopsy and colonic  biopsies were normal.   . Anemia due to blood loss, acute: see above  . Chronic pain and neuropathy: on fentanyl patch  . Recurrent pulmonary embolism: need to watch for issues as we reverse anticoagulation  DM 2: BS's reasonable even with stress and steroids  Hypokalemia: k stable at 3.5-3.6  Fever: On PO cipro. Better.  Thrombocytopenia - follow  Hematuria: ? Just due to over-anticoagulation or hemorrhagic cystitis. Rx Cipro should cover   Check KUB - Rule out developing Ileus? SCDs for DVT Proph  i thought @ transfer to floor and may if beds are needed - O/W will watch for 24 more hrs - Await GI recs. OOB to chair/PT/OT/SW written. Anticipate colonoscopy later in week.    LOS: 2 days   Katherine Mckay  M 08/14/2011, 10:23 AM

## 2011-08-14 NOTE — Progress Notes (Signed)
08/14/2011... 1615... Received report from previous RN; assigned to pt's treatment team... Assessment unchanged from previous this morning; continue plan of care... RN Marvia Pickles

## 2011-08-15 LAB — BASIC METABOLIC PANEL
BUN: 19 mg/dL (ref 6–23)
Chloride: 108 mEq/L (ref 96–112)
Creatinine, Ser: 0.8 mg/dL (ref 0.50–1.10)
GFR calc Af Amer: 86 mL/min — ABNORMAL LOW (ref >90)
GFR calc non Af Amer: 74 mL/min — ABNORMAL LOW (ref >90)
Glucose, Bld: 87 mg/dL (ref 70–99)

## 2011-08-15 LAB — CBC
HCT: 29.4 % — ABNORMAL LOW (ref 36.0–46.0)
MCH: 31 pg (ref 26.0–34.0)
MCHC: 34.4 g/dL (ref 30.0–36.0)
RDW: 17.2 % — ABNORMAL HIGH (ref 11.5–15.5)

## 2011-08-15 MED ORDER — PEG 3350-KCL-NA BICARB-NACL 420 G PO SOLR
4000.0000 mL | Freq: Once | ORAL | Status: AC
  Administered 2011-08-15: 4000 mL via ORAL

## 2011-08-15 MED ORDER — BISACODYL 5 MG PO TBEC
10.0000 mg | DELAYED_RELEASE_TABLET | Freq: Once | ORAL | Status: AC
  Administered 2011-08-15: 10 mg via ORAL
  Filled 2011-08-15: qty 1

## 2011-08-15 MED ORDER — FENTANYL 75 MCG/HR TD PT72
75.0000 ug | MEDICATED_PATCH | TRANSDERMAL | Status: DC
  Administered 2011-08-17 – 2011-08-19 (×2): 75 ug via TRANSDERMAL
  Filled 2011-08-15 (×2): qty 1

## 2011-08-15 NOTE — Progress Notes (Signed)
Pt transferred to unit alert and oriented x 4. V/S- 154/68,98.3-temp,50-pulse, 19-resp,50-pulse. Pt has no complaints of pain at this time. Will continue to monitor.

## 2011-08-15 NOTE — Progress Notes (Signed)
Subjective: Now up on the floor. No more BM's. No nausea. Pain in neck and back is worse. 7-8/10. Pain in abd is better. Breathing is fair,   Objective: Vital signs in last 24 hours: Temp:  [98.3 F (36.8 C)-99.5 F (37.5 C)] 98.3 F (36.8 C) (01/02 0531) Pulse Rate:  [50-67] 50  (01/02 0531) Resp:  [15-23] 19  (01/02 0531) BP: (112-154)/(44-68) 154/68 mmHg (01/02 0531) SpO2:  [90 %-97 %] 95 % (01/02 0531) Weight:  [60.8 kg (134 lb 0.6 oz)] 134 lb 0.6 oz (60.8 kg) (01/02 0531)  Intake/Output from previous day: 01/01 0701 - 01/02 0700 In: 1510 [I.V.:1510] Out: 615 [Urine:615] Intake/Output this shift:    General: alertpale and frail appearing. Face symmetric, oral membranes moist. Neck supple. Lungs: dimished, no wheeze. Ht Regular with sys murmur. Abd moderately distended, hypoactive BS"s  Alert, awake, clear low volume speech, mentating well. 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 Critical Value/emergent results were called by telephone at the time of interpretation on 08/13/2011  at 1145 hours  to  the patient's nurse Victorino Dike, who verbally acknowledged these results.  **END ADDENDUM** SIGNED BY: Gerrianne Scale, M.D.   08/13/2011  *RADIOLOGY REPORT*  Clinical Data: Rectal bleeding with nausea, diarrhea and abdominal pain.  History of Crohn's disease.  Contrast allergy.  CT ABDOMEN AND PELVIS WITHOUT CONTRAST  Technique:  Multidetector CT imaging of the  abdomen and pelvis was performed following the standard protocol without intravenous contrast.  Comparison: Small bowel series 01/27/2009. Lumbar MRI 03/31/2009.No previous CT.  Findings: Emphysematous changes are present in both lung bases. There is mild central airway thickening in the right lower lobe. There is a small right pleural effusion.  There is a moderate amount of ascites with most of the fluid adjacent to the liver and within the pelvis.  This fluid measures slightly higher than urine in the bladder.  No layering fluid-fluid levels are identified.  The administered enteric contrast has passed into the proximal and mid colon.  The colon is mildly dilated but shows no discrete wall thickening.  There are multiple loops of distal small bowel which demonstrate circumferential wall thickening in the right lower quadrant.  There is diffuse edema within the surrounding mesenteric fat.  This process appears to extend to the terminal ileum.  The appendix is not clearly visualized.  There is no well-defined focal fluid collection.  There appears to be diffuse biliary dilatation status post cholecystectomy.  The common bile duct appears dilated to the ampulla.  No calcified stones are seen within the common duct. There is no evidence of focal liver lesion on noncontrast imaging. The spleen and adrenal glands appear unremarkable.  The pancreas is atrophied.  There are scattered calyceal calculi in both kidneys. No hydronephrosis is present.  There are diffuse vascular calcifications.  The  urinary bladder is partially decompressed by Foley catheter, accounting for air within the bladder lumen.  The uterus appears mildly atrophied.  Schmorl's nodes involving the superior plate of L2 and the inferior endplate of L5 are stable.  There are compression deformities at T11 and T12 which are not well seen on the prior studies but do not appear acute.  IMPRESSION:  1.  Diffuse wall thickening of the distal small bowel is  likely related to active Crohn's disease given the patient's history. Ischemic bowel could have a similar appearance. 2.  There is diffuse inflammatory change in the surrounding fat with a moderate amount of free peritoneal fluid.  This fluid measures higher than water density.  No focal fluid collections are identified. 3.  No evidence of high-grade bowel obstruction or extravasated enteric contrast. 4.  Moderate biliary dilatation status post cholecystectomy. 5.  Probable chronic thoracolumbar compression deformities, some not well visualized on prior studies. Per CMS PQRS reporting requirements (PQRS Measure 24): Given the patient's age of greater than 50 and the fracture site (hip, distal radius, or spine), the patient should be tested for osteoporosis using DXA, and the appropriate treatment considered based on the DXA results.  Original Report Authenticated By: Gerrianne Scale, M.D.   Dg Abd 1 View  08/14/2011  *RADIOLOGY REPORT*  Clinical Data: Constipation.  Diffuse abdominal pain.  PORTABLE ABDOMEN - 1 VIEW 08/14/2011 1149 hours:  Comparison: CT abdomen and pelvis yesterday.  Two-view abdomen x- ray 01/18/2009 [facility].  Findings: Contrast material within upper normal caliber ascending and transverse colon.  Gas within multiple upper normal caliber small bowel loops.  Less gaseous distention of the ileal loop in the upper pelvis since the CT yesterday.  No suggestion of free intraperitoneal air.  Surgical clips in the right upper quadrant from prior cholecystectomy.  IMPRESSION: Mild generalized ileus.  Original Report Authenticated By: Arnell Sieving, M.D.    Scheduled Meds:   . calcitonin (salmon)  1 spray Alternating Nares QODAY  . ciprofloxacin  500 mg Oral BID  . DULoxetine  60 mg Oral Daily  . fentaNYL  75 mcg Transdermal QODAY  . gabapentin  300 mg Oral Daily  . hydrocortisone sod succinate (SOLU-CORTEF) injection  100 mg Intravenous Q8H  . mometasone-formoterol  2 puff  Inhalation BID  . pantoprazole (PROTONIX) IV  40 mg Intravenous Q12H  . Vitamin D (Ergocalciferol)  50,000 Units Oral Q7 days  . DISCONTD: calcium carbonate  500 mg of elemental calcium Oral BID WC  . DISCONTD: sodium chloride  500 mL Intravenous Once   Continuous Infusions:   . sodium chloride 75 mL/hr at 08/14/11 1352   PRN Meds:acetaminophen, acetaminophen, ALPRAZolam, HYDROmorphone, ondansetron (ZOFRAN) IV, ondansetron, traMADol, DISCONTD: alum & mag hydroxide-simeth  Assessment/Plan:  GI bleed: Hgb stable at 10.1. No more BM. Eval in progress .  Abdominal pain: GI to decide on next plan .  Supratherapeutic INR: back to 1.06    .  Adrenal insufficiency: on stress steroids  .  Crohn's disease:? flared  .  Anemia due to blood loss, acute: see above  .  Chronic pain: on fentanyl patch  . Recurrent pulmonary embolism: need to watch for issues as we reverse anticoagulation  DM 2: BS's reasonable even with stress and steroids  Hypokalemia: check OK Fever: On PO cipro. ? Blood product related elevation  Hematuria: ? Just due to over-anticoagulation or hemorrhagic cystitis. Rx Cipro should cover  COPD: Sats are fair at 88%   LOS:  3 days   Katherine Mckay [name] 08/15/2011, 8:31 AM

## 2011-08-15 NOTE — Progress Notes (Signed)
PT. HR WENT INTO THE 150'S FOR ABOUT 2 MINUTES, PT. WAS UP OOB, ASYPOTMATIC ONCE SHE SAT DOWN DOWN HR IMMEDIATELY STARTED TO DROP.  HR IS NOW NORMAL WILL CONTINUE TO MONITOR AND OBSERVE PT.

## 2011-08-15 NOTE — Progress Notes (Signed)
Subjective: No blood in stool. No bowel movements. No abdominal pain.  Objective: Vital signs in last 24 hours: Temp:  [98.3 F (36.8 C)-99.5 F (37.5 C)] 98.3 F (36.8 C) (01/02 0531) Pulse Rate:  [50-66] 53  (01/02 1018) Resp:  [15-23] 19  (01/02 0531) BP: (121-154)/(44-68) 154/68 mmHg (01/02 0531) SpO2:  [83 %-97 %] 94 % (01/02 1237) Weight:  [60.8 kg (134 lb 0.6 oz)] 134 lb 0.6 oz (60.8 kg) (01/02 0531) Weight change:  Last BM Date: 08/13/11  PE: GEN:  Chronically ill-appearing, but is not acutely toxic.  Tachypnea at baseline per patient. ABD:  Soft, mild protuberant, active bowel sounds.  Lab Results:   Studies/Results: Dg Abd 1 View  08/14/2011  *RADIOLOGY REPORT*  Clinical Data: Constipation.  Diffuse abdominal pain.  PORTABLE ABDOMEN - 1 VIEW 08/14/2011 1149 hours:  Comparison: CT abdomen and pelvis yesterday.  Two-view abdomen x- ray 01/18/2009 [facility].  Findings: Contrast material within upper normal caliber ascending and transverse colon.  Gas within multiple upper normal caliber small bowel loops.  Less gaseous distention of the ileal loop in the upper pelvis since the CT yesterday.  No suggestion of free intraperitoneal air.  Surgical clips in the right upper quadrant from prior cholecystectomy.  IMPRESSION: Mild generalized ileus.  Original Report Authenticated By: Arnell Sieving, M.D.   Assessment:  1.  History Crohn's disease. 2.  Neoterminal ileal inflammation on CT. 3.  Blood in stool.  Resolved upon correction of coagulopathy. 4.  Coagulopathy, resolved.  Plan:  1.  Supportive measures. 2.  Colonoscopy planned for tomorrow. 3.  Risks (bleeding, infection, bowel perforation that could require surgery, sedation-related changes in cardiopulmonary systems), benefits (identification and possible treatment of source of symptoms, exclusion of certain causes of symptoms), and alternatives (watchful waiting, radiographic imaging studies, empiric  medical treatment) of colonoscopy were explained to patient in detail and patient wishes to proceed. 4.  Further management pending colonoscopy results.  Katherine Mckay 08/15/2011, 1:10 PM

## 2011-08-15 NOTE — Progress Notes (Signed)
Physical Therapy Evaluation Patient Details Name: Katherine Mckay MRN: [id number] DOB: 1941/05/16 Today's Date: 08/15/2011 1018-1040 ev2 Problem List:  Patient Active Problem List  Diagnoses  . GI bleed  . Abdominal pain  . Supratherapeutic INR  . Diarrhea  . Adrenal insufficiency  . Crohn's disease  . Anemia due to blood loss, acute  . Chronic pain  . Recurrent pulmonary embolism    Past Medical History:  Past Medical History  Diagnosis Date  . Polio   . Osteoporosis     multiple compression fractures  . Crohn's disease     since 1966 on chronic steroids s/p ileal resection, fistula repair  . Brain tumor   . Migraines   . Hypercholesteremia   . Hypertension   . GERD (gastroesophageal reflux disease)   . Anxiety   . Depression   . Emphysema   . Macular degeneration   . Cataract   . Arthritis   . Pernicious anemia   . Primary adrenal deficiency   . COPD (chronic obstructive pulmonary disease)   . Neuropathy, idiopathic   . Anticoagulated on warfarin     Hx of DVT,   Past Surgical History:  Past Surgical History  Procedure Date  . Cholecystectomy   . Cataract repair   . Ileal resection   . Hemicolectomy     rt due to crohns    PT Assessment/Plan/Recommendation PT Assessment Clinical Impression Statement: pt admitted for possible flare of Crohn's,will benefit from PT to return to functional mobility to return home with spouse. PT Recommendation/Assessment: Patient will need skilled PT in the acute care venue PT Problem List: Decreased strength;Decreased activity tolerance;Pain PT Therapy Diagnosis : Difficulty walking;Generalized weakness;Acute pain PT Plan PT Frequency: Min 3X/week PT Treatment/Interventions: Functional mobility training;Gait training;DME instruction PT Recommendation Recommendations for Other Services: OT consult Follow Up Recommendations: Home health PT;24 hour supervision/assistance Equipment Recommended: None recommended by PT PT  Goals  Acute Rehab PT Goals PT Goal Formulation: With patient/family Time For Goal Achievement: 2 weeks Pt will go Supine/Side to Sit: Independently PT Goal: Supine/Side to Sit - Progress: Not met Pt will go Sit to Supine/Side: Independently PT Goal: Sit to Supine/Side - Progress: Not met Pt will go Sit to Stand: with supervision PT Goal: Sit to Stand - Progress: Not met Pt will go Stand to Sit: with supervision PT Goal: Stand to Sit - Progress: Not met  PT Evaluation Precautions/Restrictions    Prior Functioning  Home Living Lives With: Spouse Receives Help From: Family Type of Home: House Home Layout: One level Home Access: Stairs to enter Entrance Stairs-Rails: Doctor, general practice of Steps: 1 Bathroom Shower/Tub: Psychologist, counselling;Door [employer] Toilet: Standard Bathroom Accessibility: Yes How Accessible: Accessible via walker Home Adaptive Equipment: Grab bars around toilet;Walker - rolling;Quad cane;Built-in shower seat;Wheelchair - manual;Hand-held shower hose Prior Function Level of Independence: Independent with basic ADLs;Independent with homemaking with ambulation;Independent with gait;Independent with transfers Driving: No Vocation: Retired Producer, television/film/video: Awake/alert Overall Cognitive Status: Appears within functional limits for tasks assessed Orientation Level: Oriented X4 Sensation/Coordination   Extremity Assessment RUE Assessment RUE Assessment: Within Functional Limits LUE Assessment LUE Assessment: Within Functional Limits RLE Assessment RLE Assessment: Within Functional Limits LLE Assessment LLE Assessment: Within Functional Limits Mobility (including Balance) Bed Mobility Bed Mobility: Yes Sit to Supine - Right: 6: Modified independent (Device/Increase time);With rail;HOB flat Scooting to HOB: 6: Modified independent (Device/Increase time);With rail Transfers Transfers: Yes Sit to Stand: 4: Min assist Stand  to Sit: 4: Min assist Ambulation/Gait  Ambulation/Gait: Yes Ambulation/Gait Assistance: 4: Min assist Ambulation Distance (Feet): 100 Feet Assistive device: Rolling walker Gait Pattern: Step-to pattern  Posture/Postural Control Posture/Postural Control: No significant limitations Exercise    End of Session PT - End of Session Activity Tolerance: Patient limited by fatigue (dyspnea) General Behavior During Session: [facility] for tasks performed Cognition: [facility] for tasks performed  Rada Hay 08/15/2011, 12:31 PM

## 2011-08-15 NOTE — Progress Notes (Signed)
Occupational Therapy Evaluation Patient Details Name: Katherine Mckay MRN: [id number] DOB: 1940/08/27 Today's Date: 08/15/2011 EV2 [id number] Problem List:  Patient Active Problem List  Diagnoses  . GI bleed  . Abdominal pain  . Supratherapeutic INR  . Diarrhea  . Adrenal insufficiency  . Crohn's disease  . Anemia due to blood loss, acute  . Chronic pain  . Recurrent pulmonary embolism    Past Medical History:  Past Medical History  Diagnosis Date  . Polio   . Osteoporosis     multiple compression fractures  . Crohn's disease     since 1966 on chronic steroids s/p ileal resection, fistula repair  . Brain tumor   . Migraines   . Hypercholesteremia   . Hypertension   . GERD (gastroesophageal reflux disease)   . Anxiety   . Depression   . Emphysema   . Macular degeneration   . Cataract   . Arthritis   . Pernicious anemia   . Primary adrenal deficiency   . COPD (chronic obstructive pulmonary disease)   . Neuropathy, idiopathic   . Anticoagulated on warfarin     Hx of DVT,   Past Surgical History:  Past Surgical History  Procedure Date  . Cholecystectomy   . Cataract repair   . Ileal resection   . Hemicolectomy     rt due to crohns    OT Assessment/Plan/Recommendation OT Assessment Clinical Impression Statement: Pt will likely not need f/u OT upon d/c. Feel pt is close to baseline. Pt does demo decreased activity toleranc, standing tolerance. Would benefit from skilled OT in the acute care venue to maximize ADL performance to mod I for safe d/c home with prn A from husband. OT Recommendation/Assessment: Patient will need skilled OT in the acute care venue OT Problem List: Decreased activity tolerance;Cardiopulmonary status limiting activity OT Therapy Diagnosis : Generalized weakness OT Plan OT Frequency: Min 2X/week OT Treatment/Interventions: Self-care/ADL training;DME and/or AE instruction;Energy conservation;Therapeutic activities;Patient/family education OT  Recommendation Follow Up Recommendations: None Equipment Recommended: None recommended by OT Individuals Consulted Consulted and Agree with Results and Recommendations: Patient OT Goals Acute Rehab OT Goals OT Goal Formulation: With patient ADL Goals Pt Will Perform Grooming: with modified independence;Standing at sink;Other (comment) (x 3 tasks to improve standing activity tolerance.) ADL Goal: Grooming - Progress: Progressing toward goals Pt Will Perform Upper Body Bathing: with modified independence;Sitting, edge of bed;Sitting, chair;Unsupported ADL Goal: Upper Body Bathing - Progress: Progressing toward goals Pt Will Perform Lower Body Bathing: with modified independence;Sit to stand from chair;Sit to stand from bed ADL Goal: Lower Body Bathing - Progress: Progressing toward goals Pt Will Perform Upper Body Dressing: with modified independence;Sitting, chair;Sitting, bed;Unsupported ADL Goal: Upper Body Dressing - Progress: Progressing toward goals Pt Will Perform Lower Body Dressing: with modified independence;Sit to stand from chair;Sit to stand from bed ADL Goal: Lower Body Dressing - Progress: Progressing toward goals Pt Will Transfer to Toilet: with modified independence;Regular height toilet;Grab bars ADL Goal: Toilet Transfer - Progress: Progressing toward goals Pt Will Perform Toileting - Clothing Manipulation: with modified independence;Standing ADL Goal: Toileting - Clothing Manipulation - Progress: Progressing toward goals Pt Will Perform Toileting - Hygiene: with modified independence;Sit to stand from 3-in-1/toilet ADL Goal: Toileting - Hygiene - Progress: Progressing toward goals Pt Will Perform Tub/Shower Transfer: Shower transfer;with modified independence;Ambulation ADL Goal: Tub/Shower Transfer - Progress: Not met Additional ADL Goal #1: Pt will verbalize and demo 3 energy conservation strategies for successful ADL completion. ADL Goal: Additional Goal #1 -  Progress: Not met  OT Evaluation Precautions/Restrictions  Restrictions Weight Bearing Restrictions: No Prior Functioning Home Living Lives With: Spouse Receives Help From: Family Type of Home: House Home Layout: One level Home Access: Stairs to enter Entrance Stairs-Rails: Doctor, general practice of Steps: 1 Bathroom Shower/Tub: Psychologist, counselling;Door [employer] Toilet: Standard Bathroom Accessibility: Yes How Accessible: Accessible via walker Home Adaptive Equipment: Grab bars around toilet;Walker - rolling;Quad cane;Built-in shower seat;Wheelchair - manual;Hand-held shower hose Prior Function Level of Independence: Independent with basic ADLs;Independent with homemaking with ambulation;Independent with gait;Independent with transfers Driving: No Vocation: Retired ADL ADL Eating/Feeding: Simulated;Independent Where Assessed - Eating/Feeding: Chair Grooming: Performed;Wash/dry face;Wash/dry hands;Supervision/safety Where Assessed - Grooming: Sitting, chair;Unsupported Upper Body Bathing: Performed;Supervision/safety Where Assessed - Upper Body Bathing: Sitting, chair;Unsupported Lower Body Bathing: Supervision/safety;Performed Where Assessed - Lower Body Bathing: Sit to stand from chair Upper Body Dressing: Simulated;Supervision/safety Where Assessed - Upper Body Dressing: Sitting, chair;Unsupported Lower Body Dressing: Simulated;Supervision/safety Where Assessed - Lower Body Dressing: Sit to stand from chair Toilet Transfer: Performed;Supervision/safety Toilet Transfer Method: Proofreader: Regular height toilet;Grab bars Toileting - Clothing Manipulation: Performed;Supervision/safety Where Assessed - Toileting Clothing Manipulation: Sit to stand from 3-in-1 or toilet Toileting - Hygiene: Performed;Supervision/safety Where Assessed - Toileting Hygiene: Sit to stand from 3-in-1 or toilet Tub/Shower Transfer: Not assessed Tub/Shower Transfer  Method: Not assessed Equipment Used: Rolling walker Vision/Perception  Vision - History Baseline Vision: Wears glasses only for reading Visual History: Macular degeneration Patient Visual Report: No change from baseline Vision - Assessment Vision Assessment: Vision not tested [employer] Arousal/Alertness: Awake/alert Overall Cognitive Status: Appears within functional limits for tasks assessed Orientation Level: Oriented X4 Sensation/Coordination   Extremity Assessment RUE Assessment RUE Assessment: Within Functional Limits LUE Assessment LUE Assessment: Within Functional Limits Mobility  Bed Mobility Bed Mobility: Yes Sit to Supine - Right: 6: Modified independent (Device/Increase time);With rail;HOB flat Scooting to HOB: 6: Modified independent (Device/Increase time);With rail Exercises   End of Session OT - End of Session Equipment Utilized During Treatment: Other (comment) (RW) Activity Tolerance: Patient limited by fatigue Patient left: in bed;with call bell in reach General Behavior During Session: [facility] for tasks performed Cognition: [facility] for tasks performed   Liany Mumpower A [phone] 08/15/2011, 12:19 PM

## 2011-08-16 ENCOUNTER — Encounter (HOSPITAL_COMMUNITY): Payer: Self-pay | Admitting: Anesthesiology

## 2011-08-16 ENCOUNTER — Other Ambulatory Visit: Payer: Self-pay

## 2011-08-16 LAB — TYPE AND SCREEN
ABO/RH(D): A NEG
Antibody Screen: NEGATIVE
Unit division: 0
Unit division: 0
Unit division: 0
Unit division: 0
Unit division: 0
Unit division: 0

## 2011-08-16 LAB — BASIC METABOLIC PANEL
BUN: 21 mg/dL (ref 6–23)
Chloride: 109 mEq/L (ref 96–112)
GFR calc non Af Amer: 73 mL/min — ABNORMAL LOW (ref >90)
Glucose, Bld: 95 mg/dL (ref 70–99)
Potassium: 2.7 mEq/L — CL (ref 3.5–5.1)

## 2011-08-16 LAB — CBC
HCT: 31.8 % — ABNORMAL LOW (ref 36.0–46.0)
Hemoglobin: 10.8 g/dL — ABNORMAL LOW (ref 12.0–15.0)
MCH: 30.9 pg (ref 26.0–34.0)
MCHC: 34 g/dL (ref 30.0–36.0)

## 2011-08-16 MED ORDER — POTASSIUM CHLORIDE 10 MEQ/100ML IV SOLN
10.0000 meq | INTRAVENOUS | Status: AC
  Administered 2011-08-16 (×4): 10 meq via INTRAVENOUS
  Filled 2011-08-16 (×4): qty 100

## 2011-08-16 MED ORDER — MAGNESIUM CITRATE PO SOLN
1.0000 | Freq: Once | ORAL | Status: AC
  Administered 2011-08-16: 1 via ORAL

## 2011-08-16 NOTE — Progress Notes (Signed)
PT Cancellation Note  _X__Treatment cancelled today due to medical issues with patient which prohibited therapy -- critical value K - 2.7.  RN reports pt has 1 more run left to complete and that pt has been up to bathroom frequently today.   ___ Treatment cancelled today due to patient receiving procedure or test   ___ Treatment cancelled today due to patient's refusal to participate   ___ Treatment cancelled today due to   Signature: Tamala Ser, PT, DPT Pager: [phone] 08/16/2011

## 2011-08-16 NOTE — Progress Notes (Signed)
CRITICAL VALUE ALERT  Critical value received: K-2.7  Date of notification:  08/16/11  Time of notification:  0630  Critical value read back:yes  Nurse who received alert: Roosvelt Maser  MD notified (1st page):  Adrian Prince  Time of first page:  [phone]  MD notified (2nd page):  Time of second page:  Responding MD:  Adrian Prince  Time MD responded:  [phone]

## 2011-08-16 NOTE — Progress Notes (Signed)
Patient has been stuck by RN, IV team, and OR nurse multiple times today. IV team and OR nurse recommended PICC line because of such poor veins and access. Patient reported that every IV she has always leaks or infiltrates.   IV this am infiltrated.  Patient was scheduled for procedure this am, but there was no IV access and she was still having many brown stools.  Spoke with Endo nurse and MD called patient to reschedule procedure for in the am.  Dr. [name] was notified today at 22 about situation.  MD was informed about IV situation and he stated that if IV was to leak or infiltrate, it would be okay to put in PICC line.  Will continue to monitor patient.

## 2011-08-16 NOTE — Progress Notes (Signed)
Patient ID: Katherine Mckay, female   DOB: August 11, 1941, 71 y.o.   MRN: [id number]  Patient had difficulty getting IV placed today, which I am told might require PICC line placement.  I have discussed with patient over the telephone, and she is aware that her colonoscopy will likely have to be delayed until tomorrow, because of today's difficulty with obtaining IV access.

## 2011-08-16 NOTE — Clinical Documentation Improvement (Signed)
GENERIC DOCUMENTATION CLARIFICATION QUERY  THIS DOCUMENT IS NOT A PERMANENT PART OF THE MEDICAL RECORD  TO RESPOND TO THE THIS QUERY, FOLLOW THE INSTRUCTIONS BELOW:  1. If needed, update documentation for the patient's encounter via the notes activity.  2. Access this query again and click edit on the In [employer].  3. After updating, or not, click F2 to complete all highlighted (required) fields concerning your review. Select "additional documentation in the medical record" OR "no additional documentation provided".  4. Click Sign note button.  5. The deficiency will fall out of your In Basket *Please let us know if you are not able to complete this workflow by phone or e-mail (listed below).  Please update your documentation within the medical record to reflect your response to this query.                                                                                        08/16/11   Dear [name],  In a better effort to capture your patient's severity of illness, reflect appropriate length of stay and utilization of resources, a review of the patient medical record has revealed the following indicators.    Based on your clinical judgment, please clarify and document in a progress note and/or discharge summary the clinical condition associated with the following supporting information:  In responding to this query please exercise your independent judgment.  The fact that a query is asked, does not imply that any particular answer is desired or expected.  Based on the patient's current clinical presentation please clarify:    According to CT ABD  On 08/13/11 pt with thoracolumbar compression deformities. Please clarify whether or not thoracolumbar compression deformities can be further classified as one of the diagnoses listed below and document in pn and d/c summary. Thank You!    Possible Clinical Conditions?  thoracolumbar compression fractures  Pathological  thoracolumbar compression fracture  Traumatic thoracolumbar compression fracture  _______Other Condition__________________ _______Cannot Clinically Determine   Supporting Information:  Risk Factors: H/O osteoporosis, Arthritis, Adrenal insufficiency   Signs & Symptoms:  Diagnostics: CT ABD 08/13/11 Probable chronic thoracolumbar compression deformities, some not well visualized on prior studies. Per CMS PQRS reporting requirements (PQRS Measure 24): Given the patient's age of greater than 50 and the fracture site (hip, distal radius, or spine), the patient should be tested for osteoporosis using DXA, and the appropriate treatment considered based on the DXA results.   Treatment  fentaNYL  ULTRAM DILAUDID   You may use possible, probable, or suspect with inpatient documentation. possible, probable, suspected diagnoses MUST be documented at the time of discharge  Reviewed:  no additional documentation provided 08/28/2011 ljh  Thank You,  Enis Slipper  RN, BSN, CCDS Clinical Documentation Specialist [facility] Dept Pager: [phone] / E-mail: [email or web address]  Health Information Management [place]

## 2011-08-16 NOTE — Progress Notes (Signed)
CSW received unclear order to see Pt for "other". CSW spoke with RN and reviewed chart. No CSW needs identified currently. Pt lives with spouse and has good support. CSW signing off. If psychosocial issues arise, please reconsult.  Vennie Homans, [place] 08/16/2011 10:23 AM [phone]

## 2011-08-16 NOTE — Clinical Documentation Improvement (Signed)
Anemia Documentation Clarification Query  THIS DOCUMENT IS NOT A PERMANENT PART OF THE MEDICAL RECORD  RESPOND TO THE THIS QUERY, FOLLOW THE INSTRUCTIONS BELOW:  1. If needed, update documentation for the patient's encounter via the notes activity.  2. Access this query again and click edit on the In [employer].  3. After updating, or not, click F2 to complete all highlighted (required) fields concerning your review. Select "additional documentation in the medical record" OR "no additional documentation provided".  4. Click Sign note button.  5. The deficiency will fall out of your In Basket *Please let us know if you are not able to complete this workflow by phone or e-mail (listed below).          08/16/11  Dear Dr. [name]  In an effort to better capture your patient's severity of illness, reflect appropriate length of stay and utilization of resources, a review of the patient medical record has revealed the following indicators.    Based on your clinical judgment, please clarify and document in a progress note and/or discharge summary the clinical condition associated with the following supporting information:  In responding to this query please exercise your independent judgment.  The fact that a query is asked, does not imply that any particular answer is desired or expected.   Based on the patient's current clinical presentation please clarify:  Pt admitted with GIB/vesus  bowel ischemia.  According to H/P/PN on 08/14/11, pt with thrombocytopenia . Please clarify whether or not thrombocytopenia can be further classified as one of the diagnoses listed below and document in pn and d/c summary. Thank You!    Possible Clinical Conditions?  " Pancytopenia   " Precipitous drop in Hematocrit  " Other Condition____________  " Cannot Clinically Determine   Supporting Information:  Signs and Symptoms: Pt with GIB,  bowel ischemia, Peptic ulcer disease, Chron's,  dark stools, blood in stools, Coagulopathy  Diagnostics: Component HGB HCT  Latest Ref Rng 12.0 - 15.0 g/dL 82.9 - 56.2 %  13/03/6577 8.6 (L) 24.9 (L)  08/13/2011 7.3 (L) 20.9 (L)  08/13/2011 6.8 (LL) 19.7 (L)  08/14/2011 10.0 (L) 27.1 (L)  08/14/2011 9.9 (L) 27.8 (L)   Component Platelets  Latest Ref Rng 150 - 400 K/uL  08/13/2011 139 (L)  08/13/2011 114 (L)  08/14/2011 99 (L)  08/15/2011 133 (L)   Other: Treatments: Transfusion: 2 units ordered per H/P IV fluids : Normal Saline per IV Serial H&H monitoring  You may use possible, probable, or suspect with inpatient documentation. Possible, probable, suspected diagnoses MUST be documented at the time of discharge.  Reviewed:  no additional documentation provided  Thank You,  Enis Slipper  RN, BSN, CCDS Clinical Documentation Specialist [facility] Dept Pager: [phone] / E-mail: [email or web address]  Health Information Management [place]

## 2011-08-16 NOTE — Progress Notes (Signed)
Subjective: Looks a lot better.Breathing well. No more abd pain . Did pretty well with the prep.   Objective: Vital signs in last 24 hours: Temp:  [97.8 F (36.6 C)-98.8 F (37.1 C)] 97.8 F (36.6 C) (01/03 0549) Pulse Rate:  [53-56] 53  (01/03 0549) Resp:  [18] 18  (01/03 0549) BP: (148-159)/(60-78) 148/73 mmHg (01/03 0549) SpO2:  [83 %-96 %] 93 % (01/03 0811)  Intake/Output from previous day: 01/02 0701 - 01/03 0700 In: 6825 [I.V.:6825] Out: 2050 [Urine:2050] Intake/Output this shift: Total I/O In: 100 [IV Piggyback:100] Out: 100 [Urine:100]  General: alertless pale and frail appearing. Face symmetric, oral membranes moist. Neck supple. Lungs: dimished, moving air better. Ht Regular with sys murmur. Abd less distended, hypoactive BS"s  Alert, awake, clear low volume speech, mentating well. Less weak. A lot of bruising  Lab Results   [facility] 08/16/11 0520 08/15/11 0305  WBC 7.5 8.5  RBC 3.49* 3.26*  HGB 10.8* 10.1*  HCT 31.8* 29.4*  MCV 91.1 90.2  MCH 30.9 31.0  RDW 16.9* 17.2*  PLT 172 133*    Basename 08/16/11 0520 08/15/11 0305  NA 142 141  K 2.7* 3.5  CL 109 108  CO2 24 26  GLUCOSE 95 87  BUN 21 19  CREATININE 0.81 0.80  CALCIUM 8.0* 8.2*    Studies/Results: Dg Abd 1 View  08/14/2011  *RADIOLOGY REPORT*  Clinical Data: Constipation.  Diffuse abdominal pain.  PORTABLE ABDOMEN - 1 VIEW 08/14/2011 1149 hours:  Comparison: CT abdomen and pelvis yesterday.  Two-view abdomen x- ray 01/18/2009 [facility].  Findings: Contrast material within upper normal caliber ascending and transverse colon.  Gas within multiple upper normal caliber small bowel loops.  Less gaseous distention of the ileal loop in the upper pelvis since the CT yesterday.  No suggestion of free intraperitoneal air.  Surgical clips in the right upper quadrant from prior cholecystectomy.  IMPRESSION: Mild generalized ileus.  Original Report Authenticated By: Arnell Sieving, M.D.     Scheduled Meds:   . bisacodyl  10 mg Oral Once  . calcitonin (salmon)  1 spray Alternating Nares QODAY  . ciprofloxacin  500 mg Oral BID  . DULoxetine  60 mg Oral Daily  . fentaNYL  75 mcg Transdermal QODAY  . gabapentin  300 mg Oral Daily  . hydrocortisone sod succinate (SOLU-CORTEF) injection  100 mg Intravenous Q8H  . mometasone-formoterol  2 puff Inhalation BID  . pantoprazole (PROTONIX) IV  40 mg Intravenous Q12H  . polyethylene glycol-electrolytes  4,000 mL Oral Once  . potassium chloride  10 mEq Intravenous Q1 Hr x 4  . Vitamin D (Ergocalciferol)  50,000 Units Oral Q7 days  . DISCONTD: fentaNYL  75 mcg Transdermal QODAY   Continuous Infusions:   . sodium chloride 75 mL/hr (08/16/11 0442)   PRN Meds:acetaminophen, acetaminophen, ALPRAZolam, HYDROmorphone, ondansetron (ZOFRAN) IV, ondansetron, traMADol  Assessment/Plan:  GI bleed: Hgb better at 10.6  For colonoscopy later today. Talked at length with family/pt   Abdominal pain:  improved Supratherapeutic INR: off Rx . GI to decide when we can resume Rx .  Adrenal insufficiency: on stress steroids  .  Crohn's disease:? flared  .  Anemia due to blood loss, acute: see above  .  Chronic pain: on fentanyl patch  . Recurrent pulmonary embolism: need to watch for issues as we reverse anticoagulation  DM 2: BS's reasonable even with stress and steroids  Hypokalemia: check down to 2.7, getting Rx Fever: On PO cipro. ?  Blood product related elevation  Hematuria: ? Just due to over-anticoagulation or hemorrhagic cystitis. Rx Cipro should cover  COPD: Sats are better  LOS: 4 days   Katherine Mckay Katherine Mckay 08/16/2011, 10:11 AM

## 2011-08-17 ENCOUNTER — Encounter (HOSPITAL_COMMUNITY)
Admission: EM | Disposition: A | Discharge status: Home / Self Care | Payer: Self-pay | Source: Home / Self Care | Attending: Endocrinology

## 2011-08-17 ENCOUNTER — Encounter (HOSPITAL_COMMUNITY): Payer: Self-pay | Admitting: *Deleted

## 2011-08-17 ENCOUNTER — Other Ambulatory Visit: Payer: Self-pay | Admitting: Gastroenterology

## 2011-08-17 HISTORY — PX: COLONOSCOPY: SHX5424

## 2011-08-17 LAB — BASIC METABOLIC PANEL
BUN: 18 mg/dL (ref 6–23)
Calcium: 8 mg/dL — ABNORMAL LOW (ref 8.4–10.5)
Calcium: 7.9 mg/dL — ABNORMAL LOW (ref 8.4–10.5)
Creatinine, Ser: 0.74 mg/dL (ref 0.50–1.10)
GFR calc non Af Amer: 85 mL/min — ABNORMAL LOW (ref >90)
GFR calc non Af Amer: 85 mL/min — ABNORMAL LOW (ref >90)
Glucose, Bld: 102 mg/dL — ABNORMAL HIGH (ref 70–99)
Sodium: 141 mEq/L (ref 135–145)

## 2011-08-17 LAB — CBC
MCH: 30.8 pg (ref 26.0–34.0)
MCHC: 34.2 g/dL (ref 30.0–36.0)
Platelets: 195 10*3/uL (ref 150–400)

## 2011-08-17 SURGERY — COLONOSCOPY
Anesthesia: Moderate Sedation

## 2011-08-17 MED ORDER — SODIUM CHLORIDE 0.9 % IV SOLN: Freq: Once | INTRAVENOUS | Status: DC

## 2011-08-17 MED ORDER — MAGNESIUM OXIDE 400 MG PO TABS: 400.0000 mg | ORAL_TABLET | Freq: Two times a day (BID) | ORAL | Status: DC

## 2011-08-17 MED ORDER — FLEET ENEMA 7-19 GM/118ML RE ENEM
1.0000 | ENEMA | Freq: Once | RECTAL | Status: AC
  Administered 2011-08-17: 1 via RECTAL
  Filled 2011-08-17: qty 1

## 2011-08-17 MED ORDER — PREDNISONE 5 MG PO TABS
15.0000 mg | ORAL_TABLET | Freq: Every day | ORAL | Status: DC
  Administered 2011-08-17 – 2011-08-19 (×3): 15 mg via ORAL
  Filled 2011-08-17 (×3): qty 1

## 2011-08-17 MED ORDER — FENTANYL CITRATE 0.05 MG/ML IJ SOLN
INTRAMUSCULAR | Status: AC
  Filled 2011-08-17: qty 4

## 2011-08-17 MED ORDER — POTASSIUM CHLORIDE 10 MEQ/100ML IV SOLN
10.0000 meq | INTRAVENOUS | Status: AC
  Administered 2011-08-17 (×4): 10 meq via INTRAVENOUS
  Filled 2011-08-17 (×4): qty 100

## 2011-08-17 MED ORDER — MAGNESIUM OXIDE 400 MG PO TABS
400.0000 mg | ORAL_TABLET | Freq: Two times a day (BID) | ORAL | Status: DC
  Administered 2011-08-17 – 2011-08-19 (×5): 400 mg via ORAL
  Filled 2011-08-17 (×6): qty 1

## 2011-08-17 MED ORDER — POTASSIUM CHLORIDE CRYS ER 20 MEQ PO TBCR
40.0000 meq | EXTENDED_RELEASE_TABLET | Freq: Once | ORAL | Status: AC
  Administered 2011-08-17: 40 meq via ORAL
  Filled 2011-08-17: qty 2

## 2011-08-17 MED ORDER — AMLODIPINE BESYLATE 5 MG PO TABS
5.0000 mg | ORAL_TABLET | Freq: Every day | ORAL | Status: DC
  Administered 2011-08-17 – 2011-08-19 (×3): 5 mg via ORAL
  Filled 2011-08-17 (×3): qty 1

## 2011-08-17 MED ORDER — FENTANYL NICU IV SYRINGE 50 MCG/ML
INJECTION | INTRAMUSCULAR | Status: DC | PRN
  Administered 2011-08-17 (×2): 25 ug via INTRAVENOUS

## 2011-08-17 MED ORDER — MIDAZOLAM HCL 10 MG/2ML IJ SOLN
INTRAMUSCULAR | Status: AC
  Filled 2011-08-17: qty 4

## 2011-08-17 MED ORDER — SPIRONOLACTONE 100 MG PO TABS
100.0000 mg | ORAL_TABLET | Freq: Once | ORAL | Status: AC
  Administered 2011-08-17: 100 mg via ORAL
  Filled 2011-08-17: qty 1

## 2011-08-17 MED ORDER — MIDAZOLAM HCL 10 MG/2ML IJ SOLN
INTRAMUSCULAR | Status: DC | PRN
  Administered 2011-08-17 (×2): 1 mg via INTRAVENOUS

## 2011-08-17 MED ORDER — POTASSIUM CHLORIDE IN NACL 40-0.9 MEQ/L-% IV SOLN
INTRAVENOUS | Status: DC
  Administered 2011-08-17 (×2): via INTRAVENOUS
  Administered 2011-08-18: 100 mL/h via INTRAVENOUS
  Administered 2011-08-18 – 2011-08-19 (×2): via INTRAVENOUS
  Filled 2011-08-17 (×10): qty 1000

## 2011-08-17 NOTE — Progress Notes (Signed)
PT/OT/ST Cancellation Note  _x__Treatment cancelled today due to medical issues with patient which prohibited therapy. Pt just back from procedure with endoscopy. Also pt's K+ is 2.2.  ___ Treatment cancelled today due to patient receiving procedure or test   ___ Treatment cancelled today due to patient's refusal to participate   ___ Treatment cancelled today due to   Signature: Carmina Miller, PT [phone]

## 2011-08-17 NOTE — Progress Notes (Signed)
Subjective: Feeling better overall. Breathing is clearly better. No abd pain Still working for bowel prep  Objective: Vital signs in last 24 hours: Temp:  [97.9 F (36.6 C)-98.3 F (36.8 C)] 98 F (36.7 C) (01/04 0503) Pulse Rate:  [53-60] 55  (01/04 0503) Resp:  [18-20] 18  (01/04 0503) BP: (158-184)/(76-85) 159/85 mmHg (01/04 0503) SpO2:  [90 %-95 %] 95 % (01/04 0830)  Intake/Output from previous day: 01/03 0701 - 01/04 0700 In: 3030 [P.O.:720; I.V.:1800; IV Piggyback:510] Out: 4500 [Urine:4500] Intake/Output this shift:    General: alert  less pale and frail appearing. Face symmetric, oral membranes moist. Neck supple. Lungs: dimished, moving air better. Ht Regular with sys murmur. Abd less distended, hypoactive BS"s  Alert, awake, clear low volume speech, mentating well. Less weak. A lot of bruising   Lab Results   [facility] 08/17/11 0506 08/16/11 0520  WBC 7.2 7.5  RBC 3.90 3.49*  HGB 12.0 10.8*  HCT 35.1* 31.8*  MCV 90.0 91.1  MCH 30.8 30.9  RDW 16.3* 16.9*  PLT 195 172    Basename 08/17/11 0506 08/16/11 0520  NA 142 142  K 2.2* 2.7*  CL 105 109  CO2 28 24  GLUCOSE 102* 95  BUN 18 21  CREATININE 0.75 0.81  CALCIUM 8.0* 8.0*    Studies/Results: No results found.  Scheduled Meds:   . calcitonin (salmon)  1 spray Alternating Nares QODAY  . ciprofloxacin  500 mg Oral BID  . DULoxetine  60 mg Oral Daily  . fentaNYL  75 mcg Transdermal QODAY  . gabapentin  300 mg Oral Daily  . hydrocortisone sod succinate (SOLU-CORTEF) injection  100 mg Intravenous Q8H  . magnesium citrate  1 Bottle Oral Once  . mometasone-formoterol  2 puff Inhalation BID  . pantoprazole (PROTONIX) IV  40 mg Intravenous Q12H  . potassium chloride  10 mEq Intravenous Q1 Hr x 4  . potassium chloride  10 mEq Intravenous Q1 Hr x 4  . sodium phosphate  1 enema Rectal Once  . spironolactone  100 mg Oral Once  . Vitamin D (Ergocalciferol)  50,000 Units Oral Q7 days   Continuous  Infusions:   . 0.9 % NaCl with KCl 40 mEq / L 100 mL/hr at 08/17/11 0819  . DISCONTD: sodium chloride 75 mL/hr at 08/17/11 0431   PRN Meds:acetaminophen, acetaminophen, ALPRAZolam, HYDROmorphone, ondansetron (ZOFRAN) IV, ondansetron, traMADol  Assessment/Plan:  GI bleed: Hgb better at 12 For colonoscopy later today  Abdominal pain: improved  Supratherapeutic INR: off Rx . GI to decide when we can resume Rx  .  Adrenal insufficiency: on stress steroids  .  Crohn's disease:? flared  .  Anemia due to blood loss, acute: see above  .  Chronic pain: on fentanyl patch  . Recurrent pulmonary embolism: need to watch for issues as we reverse anticoagulation  DM 2: BS's reasonable even with stress and steroids  Hypokalemia: check down to 2.2, getting Rx  Fever: On PO cipro. ? Blood product related elevation  Hematuria: ? Just due to over-anticoagulation or hemorrhagic cystitis. Rx Cipro should cover  COPD: Sats are better   LOS: 5 days   Katherine Mckay [name] 08/17/2011, 9:36 AM

## 2011-08-17 NOTE — Discharge Summary (Signed)
DISCHARGE SUMMARY  Katherine Mckay  MR#: [id number]  DOB:Dec 14, 1940  Date of Admission: 08/12/2011 Date of Discharge: 08/19/2011  Attending Physician:[name]  Patient's Katherine Mckay  Consults:Treatment Team:  Vertell Novak., MD Procedures: ICU monitoring, transfusions of packed red blood cells and fresh frozen plasma, CT of abdomen, colonoscopy. Discharge Diagnoses: Principal Problem:  *GI bleed, clinically improved Active Problems:  Abdominal pain, clinically improved  Supratherapeutic INR, now back to normal with plan to restart of anticoagulants in 5 days    Adrenal insufficiency, on chronic medications  Crohn's disease,  Seems stable  Anemia due to blood loss, acute  Chronic pain  Recurrent pulmonary embolism COPD: Clinically improved Hematuria with probable UTI, treated Osteoporosis with multiple fractures Chronic depression and anxiety Ongoing tobacco use Type 2 diabetes, clinically stable Hypokalemia, now receiving replacement  Discharge Medications: Current Discharge Medication List    START taking these medications   Details  magnesium oxide (MAG-OX) 400 MG tablet Take 1 tablet (400 mg total) by mouth 2 (two) times daily. Qty: 28 tablet, Refills: 9      CONTINUE these medications which have NOT CHANGED   Details  ALPRAZolam (XANAX) 0.25 MG tablet Take 0.25 mg by mouth 3 (three) times daily.      amLODipine (NORVASC) 2.5 MG tablet Take 2.5 mg by mouth daily.      calcitonin, salmon, (MIACALCIN/FORTICAL) 200 UNIT/ACT nasal spray Place 1 spray into the nose every other day.      calcium carbonate (OS-CAL) 600 MG TABS Take 600 mg by mouth 2 (two) times daily with a meal.      ciprofloxacin (CIPRO) 500 MG tablet Take 500 mg by mouth 2 (two) times daily.      DULoxetine (CYMBALTA) 60 MG capsule Take 60 mg by mouth daily.      fentaNYL (DURAGESIC - DOSED MCG/HR) 75 MCG/HR Place 1 patch onto the skin every other day.      Gabapentin,  PHN, (GRALISE) 300 MG TABS Take 1 tablet by mouth daily.      mometasone-formoterol (DULERA) 100-5 MCG/ACT AERO Inhale 2 puffs into the lungs 2 (two) times daily.      nadolol (CORGARD) 20 MG tablet Take 20 mg by mouth daily.      polyethylene glycol (MIRALAX / GLYCOLAX) packet Take 17 g by mouth daily as needed. constipation     potassium chloride (KLOR-CON) 10 MEQ CR tablet Take 5-10 mEq by mouth as directed. Takes 1 tablet daily Monday through Friday for dosage and takes 1/2 tablet Saturday and Sunday for dosage     predniSONE (DELTASONE) 5 MG tablet Take 5 mg by mouth 2 (two) times daily.      ranitidine (ZANTAC) 300 MG capsule Take 300 mg by mouth 2 (two) times daily.      simvastatin (ZOCOR) 80 MG tablet Take 80 mg by mouth daily.      traMADol (ULTRAM) 50 MG tablet Take 100 mg by mouth every 8 (eight) hours as needed. Pain.Marland KitchenMarland KitchenMaximum dose= 8 tablets per day     Vitamin D, Ergocalciferol, (DRISDOL) 50000 UNITS CAPS Take 50,000 Units by mouth every 7 (seven) days.      warfarin (COUMADIN) 5 MG tablet Take 5 mg by mouth 2 (two) times daily.          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 Critical Value/emergent results were called by  telephone at the time of interpretation on 08/13/2011  at 1145 hours  to  the patient's nurse Katherine Mckay, who verbally acknowledged these results.  **END ADDENDUM** SIGNED BY: Katherine Mckay, M.D.   08/13/2011  *RADIOLOGY REPORT*  Clinical Data: Rectal bleeding with nausea, diarrhea and abdominal pain.  History of Crohn's disease.  Contrast allergy.  CT ABDOMEN AND PELVIS WITHOUT CONTRAST  Technique:  Multidetector CT imaging of the abdomen and pelvis was performed following the standard protocol without intravenous contrast.  Comparison: Small bowel series 01/27/2009. Lumbar MRI 03/31/2009.No previous CT.   Findings: Emphysematous changes are present in both lung bases. There is mild central airway thickening in the right lower lobe. There is a small right pleural effusion.  There is a moderate amount of ascites with most of the fluid adjacent to the liver and within the pelvis.  This fluid measures slightly higher than urine in the bladder.  No layering fluid-fluid levels are identified.  The administered enteric contrast has passed into the proximal and mid colon.  The colon is mildly dilated but shows no discrete wall thickening.  There are multiple loops of distal small bowel which demonstrate circumferential wall thickening in the right lower quadrant.  There is diffuse edema within the surrounding mesenteric fat.  This process appears to extend to the terminal ileum.  The appendix is not clearly visualized.  There is no well-defined focal fluid collection.  There appears to be diffuse biliary dilatation status post cholecystectomy.  The common bile duct appears dilated to the ampulla.  No calcified stones are seen within the common duct. There is no evidence of focal liver lesion on noncontrast imaging. The spleen and adrenal glands appear unremarkable.  The pancreas is atrophied.  There are scattered calyceal calculi in both kidneys. No hydronephrosis is present.  There are diffuse vascular calcifications.  The urinary bladder is partially decompressed by Foley catheter, accounting for air within the bladder lumen.  The uterus appears mildly atrophied.  Schmorl's nodes involving the superior plate of L2 and the inferior endplate of L5 are stable.  There are compression deformities at T11 and T12 which are not well seen on the prior studies but do not appear acute.  IMPRESSION:  1.  Diffuse wall thickening of the distal small bowel is likely related to active Crohn's disease given the patient's history. Ischemic bowel could have a similar appearance. 2.  There is diffuse inflammatory change in the surrounding fat  with a moderate amount of free peritoneal fluid.  This fluid measures higher than water density.  No focal fluid collections are identified. 3.  No evidence of high-grade bowel obstruction or extravasated enteric contrast. 4.  Moderate biliary dilatation status post cholecystectomy. 5.  Probable chronic thoracolumbar compression deformities, some not well visualized on prior studies. Per CMS PQRS reporting requirements (PQRS Measure 24): Given the patient's age of greater than 50 and the fracture site (hip, distal radius, or spine), the patient should be tested for osteoporosis using DXA, and the appropriate treatment considered based on the DXA results.  Original Report Authenticated By: Katherine Mckay, M.D.   Dg Abd 1 View  08/14/2011  *RADIOLOGY REPORT*  Clinical Data: Constipation.  Diffuse abdominal pain.  PORTABLE ABDOMEN - 1 VIEW 08/14/2011 1149 hours:  Comparison: CT abdomen and pelvis yesterday.  Two-view abdomen x- ray 01/18/2009 [facility].  Findings: Contrast material within upper normal caliber ascending and transverse colon.  Gas within multiple upper normal caliber small bowel loops.  Less gaseous  distention of the ileal loop in the upper pelvis since the CT yesterday.  No suggestion of free intraperitoneal air.  Surgical clips in the right upper quadrant from prior cholecystectomy.  IMPRESSION: Mild generalized ileus.  Original Report Authenticated By: Katherine Mckay, M.D.    History of Present Illness:  This 71 year old white female who presented with severe abdominal pain and GI bleeding. Hospital Course: Patient was quite ill upon presentation.  She was admitted to the intensive care unit and treated with hydration as well as transfusion of 2 units packed blood cells reversal of anticoagulation with fresh frozen plasma.  She also received vitamin K.  She was initially hypotensive and was given stress dose of steroids.  Her abdominal pain was evaluated with a variety of  modalities.  CT suggested an inflammatory process in the distal small bowel.  She also had an ileus.fortunately, abdominal pain resolved fairly quickly.  Patient was still weak and underwent a bowel prep on the fourth hospital day it was incomplete.  This was then completed in the colonoscopy was undertaken which he tolerated fairly well.  Findings included inflammation and over 15 polyps that were biopsied.  None of these looked overtly malignant. Patient's pulmonary status is very fragile early in her hospitalization.  She required supple option for much of the time.  Her diet was slowly advanced.  [name] had no cardiac problems during this hospitalization. She does continue to be weak in a chronic way  Day of Discharge Exam BP 150/77  Pulse 65  Temp(Src) 97.7 F (36.5 C) (Oral)  Resp 18  Ht 5\' 3"  (1.6 m)  Wt 60.8 kg (134 lb 0.6 oz)  BMI 23.74 kg/m2  SpO2 91% 08/19/11 0927 -- -- -- -- -- 95 % -- -- AS 08/19/11 0529 98.4 F (36.9 C) 92 -- 20 ! 155/84 mmHg    Physical Exam: General appearance: appears older than stated age some periorbital edema Eyes: no scleral icterus Throat: oropharynx moist without erythema Resp: diminished breath sounds bilaterally, no for wheezing Cardio: regular rate and rhythm GI: multiple well-healed scars,soft, non-tender; bowel sounds normal; no masses,  no organomegaly Extremities: diminished pulses 1+ edema Neuro: Alert awake in maintaining well with a trace of tremor Skin exam: multiple bruises and very thin skin Discharge Labs:  [facility] 08/17/11 0506 08/16/11 0520  NA 142 142  K 2.2* 2.7*  CL 105 109  CO2 28 24  GLUCOSE 102* 95  BUN 18 21  CREATININE 0.75 0.81  CALCIUM 8.0* 8.0*  MG -- --  PHOS -- --   No results found for this basename: AST:2,ALT:2,ALKPHOS:2,BILITOT:2,PROT:2,ALBUMIN:2 in the last 72 hours  Basename 08/17/11 0506 08/16/11 0520  WBC 7.2 7.5  NEUTROABS -- --  HGB 12.0 10.8*  HCT 35.1* 31.8*  MCV 90.0 91.1  PLT 195 172    Lab Results  Component Value Date   INR 1.06 08/14/2011   INR 1.26 08/13/2011   INR 5.61* 08/12/2011  Results for ARAEYA, LAMB (MRN [id number]) as of 08/21/2011 12:58  Ref. Range 08/19/2011 06:50  Sodium Latest Range: 135-145 mEq/L 140  Potassium Latest Range: 3.5-5.1 mEq/L 4.5  Chloride Latest Range: 96-112 mEq/L 109  CO2 Latest Range: 19-32 mEq/L 26  BUN Latest Range: 6-23 mg/dL 17  Creat Latest Range: 0.50-1.10 mg/dL 2.84  Calcium Latest Range: 8.4-10.5 mg/dL 7.7 (L)  GFR calc non Af Amer Latest Range: >90 mL/min 70 (L)  GFR calc Af Amer Latest Range: >90 mL/min 81 (L)  Glucose Latest  Range: 70-99 mg/dL 75  WBC Latest Range: 2.7-25.3 K/uL 8.9  RBC Latest Range: 3.87-5.11 MIL/uL 4.27  HGB Latest Range: 12.0-15.0 g/dL 66.4  HCT Latest Range: 36.0-46.0 % 40.4  MCV Latest Range: 78.0-100.0 fL 94.6  MCH Latest Range: 26.0-34.0 pg 31.4  MCHC Latest Range: 30.0-36.0 g/dL 40.3  RDW Latest Range: 11.5-15.5 % 16.6 (H)  Platelets Latest Range: 150-400 K/uL 190     .  Discharge instructions: to resume Coumadin in 5 days. Call if any additional bleeding occurs.  Consideration of discontinuing tobacco abuse   Disposition: to home  Follow-up Appts: Follow-up with Dr. Evlyn Mckay at [facility] in 2 weeks Call for appointment.  Condition on Discharge: improved  Tests Needing Follow-up: none  Signed: Ezra Marquess Mckay 08/17/2011, 6:18 PM   I

## 2011-08-17 NOTE — Progress Notes (Signed)
CRITICAL VALUE ALERT  Critical value received:  K+ 2.2  Date of notification:  1412  Time of notification:  0650  Critical value read back:yes  Nurse who received alert:  Bland Span, RN  MD notified (1st page):  Dr Eloise Harman  Time of first page:  [phone]  MD notified (2nd page):n/a   Time of second page:n/a  Responding MD:  Dr Eloise Harman  Time MD responded:  0700

## 2011-08-17 NOTE — Progress Notes (Signed)
Patient was sent to endoscopy for colonoscopy prior to 4 runs of K being sent from pharmacy.  Patient did not return to unit until after 1300.  Potassium was started as soon as she returned to unit.  Had to push back stat BMET due to patient only being on her first run of K. Clarisa Danser, Joslyn Devon

## 2011-08-17 NOTE — Progress Notes (Signed)
OT Cancellation Note  _x__Treatment cancelled today due to medical issues with patient which prohibited therapy. Elevated BP and fatigued from colonoscopy. Will re-check as schedule permits.  ___ Treatment cancelled today due to patient receiving procedure or test   ___ Treatment cancelled today due to patient's refusal to participate   ___ Treatment cancelled today due to   Signature: Garrel Ridgel, OTR/L  Pager [phone] 08/17/2011

## 2011-08-17 NOTE — Progress Notes (Signed)
PT/OT/ST Cancellation Note  ___Treatment cancelled today due to medical issues with patient which prohibited therapy  __x_ Treatment cancelled today due to patient receiving procedure or test. Pt leaving for procedure with endoscopy. Will check back later today for PT session. Thanks.   ___ Treatment cancelled today due to patient's refusal to participate   ___ Treatment cancelled today due to   Signature: Carmina Miller, PT [phone]

## 2011-08-17 NOTE — Interval H&P Note (Signed)
History and Physical Interval Note:  08/17/2011 11:46 AM  Katherine Mckay  has presented today for surgery, with the diagnosis of thickening/blood in stool  The various methods of treatment have been discussed with the patient and family. After consideration of risks, benefits and other options for treatment, the patient has consented to  Procedure(s): COLONOSCOPY as a surgical intervention .  The patients' history has been reviewed, patient examined, no change in status, stable for surgery.  I have reviewed the patients' chart and labs.  Questions were answered to the patient's satisfaction.     Freddy Jaksch  Risks (bleeding, infection, bowel perforation that could require surgery, sedation-related changes in cardiopulmonary systems), benefits (identification and possible treatment of source of symptoms, exclusion of certain causes of symptoms), and alternatives (watchful waiting, radiographic imaging studies, empiric medical treatment) of colonoscopy were explained to patient in detail and patient wishes to proceed.

## 2011-08-17 NOTE — Brief Op Note (Signed)
Please see [facility] note dated 08/17/11.  

## 2011-08-17 NOTE — Progress Notes (Signed)
CRITICAL VALUE ALERT  Critical value received:  K+ 2.7  Date of notification:  08/16/10  Time of notification:  1950  Critical value read back:yes  Nurse who received alert:  Clovia Cuff  MD notified (1st page):  Tisovec  Time of first page:  1955  MD notified (2nd page):  Time of second page:  Responding MD:  Tisovec  Time MD responded:  [phone]

## 2011-08-17 NOTE — Op Note (Signed)
[facility] [address] [place], [place]  [address]  COLONOSCOPY PROCEDURE REPORT  PATIENT:  Katherine Mckay, Katherine Mckay  MR#:  [id number] BIRTHDATE:  January 14, 1941, 70 yrs. old  GENDER:  female ENDOSCOPIST:  Willis Modena MD REF. BY:  Adrian Prince, M.D. PROCEDURE DATE:  08/17/2011 PROCEDURE:  Colonoscopy with biopsy and snare polypectomy ASA CLASS:  Class III INDICATIONS:  neoterminal ileal thickening (on CT), blood in stool  MEDICATIONS:   Fentanyl 50 mcg IV, Versed 2 mg IV DESCRIPTION OF PROCEDURE:   After the risks benefits and alternatives of the procedure were thoroughly explained, informed consent was obtained.  Digital rectal exam was performed and revealed no abnormalities.   The  endoscope was introduced through the anus and advanced to the ileum, without limitations.  The quality of the prep was fair..  The instrument was then slowly withdrawn as the colon was fully examined.  <<PROCEDUREIMAGES>>  FINDINGS:  Diminished anal sphincter tone, otherwise normal digital rectal examination.  Few sigmoid diverticula seen. Multiple small and large colon polyps seen throughout colon,   10 in total, removed with variety of modalities (hot + cold snare, cold biopsy  forceps).  Distal 10cm of neoterminal ileum was granular but did not have typical appearance of Crohn's disease; this was biopsied.  Normal retroflexed view of rectum.  Prep quality was fair; diminutive or subtle polyps could have been missed. ENDOSCOPIC IMPRESSION:    1.  Granular neoterminal ileum, biopsied. 2.  Multiple polyps, removed as above. 3.  Few sigmoid diverticula. 4.  Otherwise normal colonoscopy.  No findings endoscopically to explain patient's neoterminal             ileal thickening seen on CT scan.  RECOMMENDATIONS:      1.  Watch for potential complications of procedure, especially post-polypectomy  bleeding. 2.  Await biopsy  results. 3.  Pending her overall state-of-health, would  consider repeat colonoscopy in 1-2 years. 4.  Would wait at least five (5) days prior to restarting warfarin.  REPEAT EXAM:  No  ______________________________ Willis Modena  CC:  n. eSIGNEDWillis Modena at 08/17/2011 01:04 PM  Theressa Millard, [phone]

## 2011-08-17 NOTE — H&P (View-Only) (Signed)
Subjective: Feeling better overall. Breathing is clearly better. No abd pain Still working for bowel prep  Objective: Vital signs in last 24 hours: Temp:  [97.9 F (36.6 C)-98.3 F (36.8 C)] 98 F (36.7 C) (01/04 0503) Pulse Rate:  [53-60] 55  (01/04 0503) Resp:  [18-20] 18  (01/04 0503) BP: (158-184)/(76-85) 159/85 mmHg (01/04 0503) SpO2:  [90 %-95 %] 95 % (01/04 0830)  Intake/Output from previous day: 01/03 0701 - 01/04 0700 In: 3030 [P.O.:720; I.V.:1800; IV Piggyback:510] Out: 4500 [Urine:4500] Intake/Output this shift:    General: alert  less pale and frail appearing. Face symmetric, oral membranes moist. Neck supple. Lungs: dimished, moving air better. Ht Regular with sys murmur. Abd less distended, hypoactive BS"s  Alert, awake, clear low volume speech, mentating well. Less weak. A lot of bruising   Lab Results   Basename 08/17/11 0506 08/16/11 0520  WBC 7.2 7.5  RBC 3.90 3.49*  HGB 12.0 10.8*  HCT 35.1* 31.8*  MCV 90.0 91.1  MCH 30.8 30.9  RDW 16.3* 16.9*  PLT 195 172    Basename 08/17/11 0506 08/16/11 0520  NA 142 142  K 2.2* 2.7*  CL 105 109  CO2 28 24  GLUCOSE 102* 95  BUN 18 21  CREATININE 0.75 0.81  CALCIUM 8.0* 8.0*    Studies/Results: No results found.  Scheduled Meds:   . calcitonin (salmon)  1 spray Alternating Nares QODAY  . ciprofloxacin  500 mg Oral BID  . DULoxetine  60 mg Oral Daily  . fentaNYL  75 mcg Transdermal QODAY  . gabapentin  300 mg Oral Daily  . hydrocortisone sod succinate (SOLU-CORTEF) injection  100 mg Intravenous Q8H  . magnesium citrate  1 Bottle Oral Once  . mometasone-formoterol  2 puff Inhalation BID  . pantoprazole (PROTONIX) IV  40 mg Intravenous Q12H  . potassium chloride  10 mEq Intravenous Q1 Hr x 4  . potassium chloride  10 mEq Intravenous Q1 Hr x 4  . sodium phosphate  1 enema Rectal Once  . spironolactone  100 mg Oral Once  . Vitamin D (Ergocalciferol)  50,000 Units Oral Q7 days   Continuous  Infusions:   . 0.9 % NaCl with KCl 40 mEq / L 100 mL/hr at 08/17/11 0819  . DISCONTD: sodium chloride 75 mL/hr at 08/17/11 0431   PRN Meds:acetaminophen, acetaminophen, ALPRAZolam, HYDROmorphone, ondansetron (ZOFRAN) IV, ondansetron, traMADol  Assessment/Plan:  GI bleed: Hgb better at 12 For colonoscopy later today  Abdominal pain: improved  Supratherapeutic INR: off Rx . GI to decide when we can resume Rx  .  Adrenal insufficiency: on stress steroids  .  Crohn's disease:? flared  .  Anemia due to blood loss, acute: see above  .  Chronic pain: on fentanyl patch  . Recurrent pulmonary embolism: need to watch for issues as we reverse anticoagulation  DM 2: BS's reasonable even with stress and steroids  Hypokalemia: check down to 2.2, getting Rx  Fever: On PO cipro. ? Blood product related elevation  Hematuria: ? Just due to over-anticoagulation or hemorrhagic cystitis. Rx Cipro should cover  COPD: Sats are better   LOS: 5 days   Katherine Mckay [name] 08/17/2011, 9:36 AM      

## 2011-08-17 NOTE — Progress Notes (Signed)
Pt. Returned from her colonoscopy around 1350.  Her BP upon returning was elevated at 193/60.  Continued to monitor BP over the next hour which remained elevated at 180/85, 180/78, and 180/84. Was monitoring patient after giving pain medication to see if it was related to pain but BP didn't decrease.  Notified MD on call. New orders received.  Will continue to monitor BP. Ellias Mcelreath, Joslyn Devon

## 2011-08-18 LAB — BASIC METABOLIC PANEL
Calcium: 7.7 mg/dL — ABNORMAL LOW (ref 8.4–10.5)
GFR calc Af Amer: >90 mL/min (ref >90)
GFR calc non Af Amer: 84 mL/min — ABNORMAL LOW (ref >90)
Potassium: 3.6 mEq/L (ref 3.5–5.1)
Sodium: 140 mEq/L (ref 135–145)

## 2011-08-18 LAB — CBC
Hemoglobin: 12.9 g/dL (ref 12.0–15.0)
MCHC: 34.1 g/dL (ref 30.0–36.0)
Platelets: 196 10*3/uL (ref 150–400)
RBC: 4.13 MIL/uL (ref 3.87–5.11)

## 2011-08-18 NOTE — Progress Notes (Signed)
Patient was moving up in bed and "accidentally stepped" on her foley catheter.  She called out stating that her "catheter came out."  When I came in to assess, the catheter was pulled out with balloon still inflated. When I asked her if she was having any discomfort, she replied "no."  Patient does not want me to reinsert catheter since she is being discharged tomorrow.  Will monitor next time she voids for any discomfort, blood, etc.  At this time, patient states she is "fine."  Rodell Marrs, Joslyn Devon

## 2011-08-18 NOTE — Progress Notes (Signed)
Subjective: Adamant that she cannot go home today.   Her back hurt her and she got in a panic this AM.  Discussed with family in room this AM.  Wants to try solid food as well.  Objective: Vital signs in last 24 hours: Temp:  [97.5 F (36.4 C)-98.3 F (36.8 C)] 98.3 F (36.8 C) (01/05 0517) Pulse Rate:  [64-81] 64  (01/05 0517) Resp:  [11-50] 20  (01/05 0517) BP: (125-193)/(60-107) 149/70 mmHg (01/05 0517) SpO2:  [88 %-98 %] 92 % (01/05 0517) Weight change:  Last BM Date: 08/18/11  Intake/Output from previous day: 01/04 0701 - 01/05 0700 In: 2500 [I.V.:2400; IV Piggyback:100] Out: 3426 [Urine:3425; Stool:1] Intake/Output this shift:    General appearance: alert and no distress Resp: clear to auscultation bilaterally Cardio: regular rate and rhythm, S1, S2 normal, no murmur, click, rub or gallop GI: soft, non-tender; bowel sounds normal; no masses,  no organomegaly  Lab Results:  Basename 08/18/11 0533 08/17/11 0506  WBC 9.4 7.2  HGB 12.9 12.0  HCT 37.8 35.1*  PLT 196 195   BMET  Basename 08/18/11 0533 08/17/11 1830  NA 140 141  K 3.6 2.7*  CL 104 102  CO2 29 27  GLUCOSE 98 140*  BUN 12 15  CREATININE 0.77 0.74  CALCIUM 7.7* 7.9*    Studies/Results: No results found.  Medications:  I have reviewed the patient's current medications. Scheduled:   . amLODipine  5 mg Oral Daily  . calcitonin (salmon)  1 spray Alternating Nares QODAY  . ciprofloxacin  500 mg Oral BID  . DULoxetine  60 mg Oral Daily  . fentaNYL  75 mcg Transdermal QODAY  . gabapentin  300 mg Oral Daily  . magnesium oxide  400 mg Oral BID  . mometasone-formoterol  2 puff Inhalation BID  . pantoprazole (PROTONIX) IV  40 mg Intravenous Q12H  . potassium chloride  10 mEq Intravenous Q1 Hr x 4  . potassium chloride  40 mEq Oral Once  . predniSONE  15 mg Oral Q breakfast  . Vitamin D (Ergocalciferol)  50,000 Units Oral Q7 days  . DISCONTD: sodium chloride   Intravenous Once  . DISCONTD:  hydrocortisone sod succinate (SOLU-CORTEF) injection  100 mg Intravenous Q8H   Continuous:   . 0.9 % NaCl with KCl 40 mEq / L 100 mL/hr (08/18/11 0519)   WUJ:[id number], HYDROmorphone, traMADol, DISCONTD: acetaminophen, DISCONTD: acetaminophen, DISCONTD: fentaNYL, DISCONTD: midazolam, DISCONTD: ondansetron (ZOFRAN) IV, DISCONTD: ondansetron  Assessment/Plan: GI Bleed, s/p colonoscopy with multiple polyps.  No blood loss overnight. Potassium normalized Anxiety-  Xanax PRN Will advance diet with hop of disposition tomorrow to home.   LOS: 6 days   Namita Yearwood W 08/18/2011, 9:53 AM

## 2011-08-19 LAB — BASIC METABOLIC PANEL
CO2: 26 mEq/L (ref 19–32)
Calcium: 7.7 mg/dL — ABNORMAL LOW (ref 8.4–10.5)
GFR calc non Af Amer: 70 mL/min — ABNORMAL LOW (ref >90)
Glucose, Bld: 75 mg/dL (ref 70–99)
Potassium: 4.5 mEq/L (ref 3.5–5.1)
Sodium: 140 mEq/L (ref 135–145)

## 2011-08-19 LAB — CBC
Hemoglobin: 13.4 g/dL (ref 12.0–15.0)
MCH: 31.4 pg (ref 26.0–34.0)
Platelets: 190 10*3/uL (ref 150–400)
RBC: 4.27 MIL/uL (ref 3.87–5.11)
WBC: 8.9 10*3/uL (ref 4.0–10.5)

## 2011-08-19 NOTE — Progress Notes (Signed)
Spoke with Dr. Wylene Simmer as there is no follow-up appt noted in discharge orders. States he wants patient to call the office to schedule a "follow-up" appt with Dr. [name]. Made him aware that the patient is 87-87% on Room Air; MD states pt actively smokes and oxygen at home is not needed at this time.  Patient states she feels good, and she said she will speak with Dr. [name] about the need of home oxygen in the future.

## 2011-08-19 NOTE — Progress Notes (Signed)
Upon assessment pt on O2 4LPM, sats 96%. Pt states she is heavy smoker 1 PPD. O2 weaned to 1L saturations 91%. Will keep saturations >90%. Pt desires adamantly to go home today. Assisted to side of bed to dangle, sats remain 90-91%. Will recheck saturations shortly. Pt states she feels better, continues to deal with chronic pain issues.

## 2011-08-29 ENCOUNTER — Encounter (HOSPITAL_COMMUNITY): Payer: Self-pay | Admitting: Gastroenterology

## 2011-09-25 ENCOUNTER — Encounter (HOSPITAL_COMMUNITY): Payer: Self-pay

## 2011-09-25 ENCOUNTER — Other Ambulatory Visit: Payer: Self-pay

## 2011-09-25 ENCOUNTER — Inpatient Hospital Stay (HOSPITAL_COMMUNITY)
Admission: EM | Admit: 2011-09-25 | Discharge: 2011-09-28 | DRG: 378 | Disposition: A | Discharge status: Home / Self Care | Payer: Medicare Other | Attending: Endocrinology | Admitting: Endocrinology

## 2011-09-25 DIAGNOSIS — F3289 Other specified depressive episodes: Secondary | ICD-10-CM | POA: Diagnosis present

## 2011-09-25 DIAGNOSIS — G894 Chronic pain syndrome: Secondary | ICD-10-CM | POA: Diagnosis present

## 2011-09-25 DIAGNOSIS — G8929 Other chronic pain: Secondary | ICD-10-CM | POA: Diagnosis present

## 2011-09-25 DIAGNOSIS — I2699 Other pulmonary embolism without acute cor pulmonale: Secondary | ICD-10-CM | POA: Diagnosis present

## 2011-09-25 DIAGNOSIS — E2749 Other adrenocortical insufficiency: Secondary | ICD-10-CM | POA: Diagnosis present

## 2011-09-25 DIAGNOSIS — J4489 Other specified chronic obstructive pulmonary disease: Secondary | ICD-10-CM | POA: Diagnosis present

## 2011-09-25 DIAGNOSIS — F329 Major depressive disorder, single episode, unspecified: Secondary | ICD-10-CM | POA: Diagnosis present

## 2011-09-25 DIAGNOSIS — D62 Acute posthemorrhagic anemia: Secondary | ICD-10-CM | POA: Diagnosis present

## 2011-09-25 DIAGNOSIS — R269 Unspecified abnormalities of gait and mobility: Secondary | ICD-10-CM | POA: Diagnosis present

## 2011-09-25 DIAGNOSIS — R791 Abnormal coagulation profile: Secondary | ICD-10-CM | POA: Diagnosis present

## 2011-09-25 DIAGNOSIS — J449 Chronic obstructive pulmonary disease, unspecified: Secondary | ICD-10-CM | POA: Diagnosis present

## 2011-09-25 DIAGNOSIS — E559 Vitamin D deficiency, unspecified: Secondary | ICD-10-CM | POA: Diagnosis present

## 2011-09-25 DIAGNOSIS — R7309 Other abnormal glucose: Secondary | ICD-10-CM | POA: Diagnosis present

## 2011-09-25 DIAGNOSIS — E785 Hyperlipidemia, unspecified: Secondary | ICD-10-CM | POA: Diagnosis present

## 2011-09-25 DIAGNOSIS — K922 Gastrointestinal hemorrhage, unspecified: Principal | ICD-10-CM | POA: Diagnosis present

## 2011-09-25 DIAGNOSIS — D510 Vitamin B12 deficiency anemia due to intrinsic factor deficiency: Secondary | ICD-10-CM | POA: Diagnosis present

## 2011-09-25 DIAGNOSIS — R109 Unspecified abdominal pain: Secondary | ICD-10-CM | POA: Diagnosis present

## 2011-09-25 DIAGNOSIS — G589 Mononeuropathy, unspecified: Secondary | ICD-10-CM | POA: Diagnosis present

## 2011-09-25 DIAGNOSIS — F411 Generalized anxiety disorder: Secondary | ICD-10-CM | POA: Diagnosis present

## 2011-09-25 DIAGNOSIS — Z7901 Long term (current) use of anticoagulants: Secondary | ICD-10-CM

## 2011-09-25 DIAGNOSIS — K5090 Crohn's disease, unspecified, without complications: Secondary | ICD-10-CM | POA: Diagnosis present

## 2011-09-25 DIAGNOSIS — E2740 Unspecified adrenocortical insufficiency: Secondary | ICD-10-CM | POA: Diagnosis present

## 2011-09-25 DIAGNOSIS — F17200 Nicotine dependence, unspecified, uncomplicated: Secondary | ICD-10-CM | POA: Diagnosis present

## 2011-09-25 DIAGNOSIS — M810 Age-related osteoporosis without current pathological fracture: Secondary | ICD-10-CM | POA: Diagnosis present

## 2011-09-25 DIAGNOSIS — Z86711 Personal history of pulmonary embolism: Secondary | ICD-10-CM

## 2011-09-25 DIAGNOSIS — E46 Unspecified protein-calorie malnutrition: Secondary | ICD-10-CM | POA: Diagnosis present

## 2011-09-25 DIAGNOSIS — I10 Essential (primary) hypertension: Secondary | ICD-10-CM | POA: Diagnosis present

## 2011-09-25 LAB — BASIC METABOLIC PANEL
BUN: 23 mg/dL (ref 6–23)
CO2: 30 mEq/L (ref 19–32)
Calcium: 8.5 mg/dL (ref 8.4–10.5)
Chloride: 108 mEq/L (ref 96–112)
Creatinine, Ser: 1.05 mg/dL (ref 0.50–1.10)
GFR calc Af Amer: 62 mL/min — ABNORMAL LOW (ref >90)
GFR calc non Af Amer: 53 mL/min — ABNORMAL LOW (ref >90)
Glucose, Bld: 98 mg/dL (ref 70–99)
Potassium: 4.3 mEq/L (ref 3.5–5.1)
Sodium: 143 mEq/L (ref 135–145)

## 2011-09-25 LAB — PROTIME-INR
INR: 7.48 (ref 0.00–1.49)
Prothrombin Time: 64.6 seconds — ABNORMAL HIGH (ref 11.6–15.2)

## 2011-09-25 MED ORDER — DOCUSATE SODIUM 100 MG PO CAPS
100.0000 mg | ORAL_CAPSULE | Freq: Two times a day (BID) | ORAL | Status: DC
  Administered 2011-09-26 – 2011-09-28 (×5): 100 mg via ORAL
  Filled 2011-09-25 (×7): qty 1

## 2011-09-25 MED ORDER — DULOXETINE HCL 60 MG PO CPEP
60.0000 mg | ORAL_CAPSULE | Freq: Every day | ORAL | Status: DC
  Administered 2011-09-26 – 2011-09-28 (×3): 60 mg via ORAL
  Filled 2011-09-25 (×3): qty 1

## 2011-09-25 MED ORDER — HYDROMORPHONE HCL PF 1 MG/ML IJ SOLN
1.0000 mg | INTRAMUSCULAR | Status: DC | PRN
  Administered 2011-09-25 – 2011-09-28 (×10): 1 mg via INTRAVENOUS
  Filled 2011-09-25 (×10): qty 1

## 2011-09-25 MED ORDER — ONDANSETRON HCL 4 MG PO TABS: 4.0000 mg | ORAL_TABLET | Freq: Four times a day (QID) | ORAL | Status: DC | PRN

## 2011-09-25 MED ORDER — PREDNISONE 10 MG PO TABS
10.0000 mg | ORAL_TABLET | Freq: Every day | ORAL | Status: DC
  Administered 2011-09-26 – 2011-09-28 (×3): 10 mg via ORAL
  Filled 2011-09-25 (×3): qty 1

## 2011-09-25 MED ORDER — TRAMADOL HCL 50 MG PO TABS: 100.0000 mg | ORAL_TABLET | Freq: Three times a day (TID) | ORAL | Status: DC | PRN

## 2011-09-25 MED ORDER — POTASSIUM CHLORIDE CRYS ER 10 MEQ PO TBCR
10.0000 meq | EXTENDED_RELEASE_TABLET | ORAL | Status: DC
  Administered 2011-09-26 – 2011-09-28 (×3): 10 meq via ORAL
  Filled 2011-09-25 (×3): qty 1

## 2011-09-25 MED ORDER — VITAMIN K1 10 MG/ML IJ SOLN
5.0000 mg | Freq: Once | INTRAVENOUS | Status: AC
  Administered 2011-09-25: 5 mg via INTRAVENOUS
  Filled 2011-09-25: qty 0.5

## 2011-09-25 MED ORDER — WARFARIN SODIUM 2.5 MG PO TABS: 2.5000 mg | ORAL_TABLET | Freq: Every day | ORAL | Status: DC

## 2011-09-25 MED ORDER — POTASSIUM CHLORIDE IN NACL 20-0.9 MEQ/L-% IV SOLN
INTRAVENOUS | Status: DC
  Administered 2011-09-25 – 2011-09-27 (×3): via INTRAVENOUS
  Filled 2011-09-25 (×7): qty 1000

## 2011-09-25 MED ORDER — CYANOCOBALAMIN 1000 MCG/ML IJ SOLN
1000.0000 ug | INTRAMUSCULAR | Status: DC
  Administered 2011-09-28: 1000 ug via INTRAMUSCULAR
  Filled 2011-09-25: qty 1

## 2011-09-25 MED ORDER — ONDANSETRON HCL 4 MG/2ML IJ SOLN
4.0000 mg | Freq: Four times a day (QID) | INTRAMUSCULAR | Status: DC | PRN
  Administered 2011-09-25: 4 mg via INTRAVENOUS
  Filled 2011-09-25: qty 2

## 2011-09-25 MED ORDER — GABAPENTIN (ONCE-DAILY) 300 MG PO TABS: 1.0000 | ORAL_TABLET | Freq: Every day | ORAL | Status: DC

## 2011-09-25 MED ORDER — SUCRALFATE 1 G PO TABS
1.0000 g | ORAL_TABLET | Freq: Three times a day (TID) | ORAL | Status: DC
  Administered 2011-09-26 – 2011-09-28 (×8): 1 g via ORAL
  Filled 2011-09-25 (×13): qty 1

## 2011-09-25 MED ORDER — CALCIUM CARBONATE 1250 (500 CA) MG PO TABS
1.0000 | ORAL_TABLET | Freq: Two times a day (BID) | ORAL | Status: DC
  Administered 2011-09-26 – 2011-09-28 (×5): 500 mg via ORAL
  Filled 2011-09-25 (×6): qty 1

## 2011-09-25 MED ORDER — ZOLPIDEM TARTRATE 5 MG PO TABS: 5.0000 mg | ORAL_TABLET | Freq: Every evening | ORAL | Status: DC | PRN

## 2011-09-25 MED ORDER — ALIGN 4 MG PO CAPS: 1.0000 | ORAL_CAPSULE | Freq: Every day | ORAL | Status: DC

## 2011-09-25 MED ORDER — VITAMIN D (ERGOCALCIFEROL) 1.25 MG (50000 UNIT) PO CAPS: 50000.0000 [IU] | ORAL_CAPSULE | ORAL | Status: DC

## 2011-09-25 MED ORDER — NADOLOL 20 MG PO TABS
20.0000 mg | ORAL_TABLET | Freq: Every day | ORAL | Status: DC
  Administered 2011-09-26 – 2011-09-28 (×3): 20 mg via ORAL
  Filled 2011-09-25 (×4): qty 1

## 2011-09-25 MED ORDER — MOMETASONE FURO-FORMOTEROL FUM 200-5 MCG/ACT IN AERO
2.0000 | INHALATION_SPRAY | Freq: Two times a day (BID) | RESPIRATORY_TRACT | Status: DC
  Administered 2011-09-26 – 2011-09-27 (×3): 2 via RESPIRATORY_TRACT

## 2011-09-25 MED ORDER — ALBUTEROL SULFATE HFA 108 (90 BASE) MCG/ACT IN AERS: 2.0000 | INHALATION_SPRAY | Freq: Four times a day (QID) | RESPIRATORY_TRACT | Status: DC | PRN

## 2011-09-25 MED ORDER — POTASSIUM CHLORIDE CRYS ER 10 MEQ PO TBCR: 5.0000 meq | EXTENDED_RELEASE_TABLET | ORAL | Status: DC

## 2011-09-25 MED ORDER — ALPRAZOLAM 0.25 MG PO TABS
0.2500 mg | ORAL_TABLET | Freq: Three times a day (TID) | ORAL | Status: DC | PRN
  Administered 2011-09-26 – 2011-09-27 (×2): 0.25 mg via ORAL
  Filled 2011-09-25 (×2): qty 1

## 2011-09-25 MED ORDER — FENTANYL 75 MCG/HR TD PT72
75.0000 ug | MEDICATED_PATCH | TRANSDERMAL | Status: DC
  Administered 2011-09-27: 75 ug via TRANSDERMAL
  Filled 2011-09-25: qty 1

## 2011-09-25 MED ORDER — POTASSIUM CHLORIDE ER 10 MEQ PO TBCR: 5.0000 meq | EXTENDED_RELEASE_TABLET | Freq: Every day | ORAL | Status: DC

## 2011-09-25 MED ORDER — GABAPENTIN 300 MG PO CAPS
300.0000 mg | ORAL_CAPSULE | Freq: Every day | ORAL | Status: DC
Start: 2011-09-25 — End: 2011-09-28
  Administered 2011-09-26 – 2011-09-28 (×3): 300 mg via ORAL
  Filled 2011-09-25 (×4): qty 1

## 2011-09-25 MED ORDER — CALCIUM CARBONATE 600 MG PO TABS: 600.0000 mg | ORAL_TABLET | Freq: Two times a day (BID) | ORAL | Status: DC

## 2011-09-25 MED ORDER — ALUM & MAG HYDROXIDE-SIMETH 200-200-20 MG/5ML PO SUSP: 30.0000 mL | Freq: Four times a day (QID) | ORAL | Status: DC | PRN

## 2011-09-25 MED ORDER — AMLODIPINE BESYLATE 2.5 MG PO TABS
2.5000 mg | ORAL_TABLET | Freq: Every day | ORAL | Status: DC
  Administered 2011-09-26 – 2011-09-28 (×3): 2.5 mg via ORAL
  Filled 2011-09-25 (×4): qty 1

## 2011-09-25 MED ORDER — FLORA-Q PO CAPS
1.0000 | ORAL_CAPSULE | Freq: Every day | ORAL | Status: DC
  Administered 2011-09-26 – 2011-09-28 (×3): 1 via ORAL
  Filled 2011-09-25 (×4): qty 1

## 2011-09-25 NOTE — ED Provider Notes (Signed)
History    71 year old female with GI bleed. Since about Wednesday patient has been having black stool. Patient has a history of a GI bleed of uncertain source. Was admitted to the hospital about a month ago for the same. Had colonoscopy which showed multiple polyps, otherwise unremarkable.  Last hemoglobin prior to discharge is 13. Patient was evaluated by gastroenterologist, Dr. Matthias Hughs today. Repeat blood work showed a hemoglobin of 9. Patient is on Coumadin for history of pulmonary embolism.   CSN: [id number]  Arrival date & time 09/25/11  1617   First MD Initiated Contact with Patient 09/25/11 1657      No chief complaint on file.   (Consider location/radiation/quality/duration/timing/severity/associated sxs/prior treatment) HPI  Past Medical History  Diagnosis Date  . Polio   . Osteoporosis     multiple compression fractures  . Crohn's disease     since 1966 on chronic steroids s/p ileal resection, fistula repair  . Brain tumor   . Migraines   . Hypercholesteremia   . Hypertension   . GERD (gastroesophageal reflux disease)   . Anxiety   . Depression   . Emphysema   . Macular degeneration   . Cataract   . Arthritis   . Pernicious anemia   . Primary adrenal deficiency   . COPD (chronic obstructive pulmonary disease)   . Neuropathy, idiopathic   . Anticoagulated on warfarin     Hx of DVT,    Past Surgical History  Procedure Date  . Cholecystectomy   . Cataract repair   . Ileal resection   . Hemicolectomy     rt due to crohns  . Colonoscopy 08/17/2011    Procedure: COLONOSCOPY;  Surgeon: Freddy Jaksch, MD;  Location: [facility];  Service: Endoscopy;  Laterality: N/A;    Family History  Problem Relation Age of Onset  . Anesthesia problems Neg Hx   . Hypotension Neg Hx   . Malignant hyperthermia Neg Hx   . Pseudochol deficiency Neg Hx     History  Substance Use Topics  . Smoking status: Current Everyday Smoker -- 1.0 packs/day    Types: Cigarettes   . Smokeless tobacco: Not on file  . Alcohol Use: No    OB History    Grav Para Term Preterm Abortions TAB SAB Ect Mult Living                  Review of Systems   Review of symptoms negative unless otherwise noted in HPI.   Allergies  Celebrex; Flagyl; Smz-tmp ds; Ceclor; Ciprofloxacin hcl; Doxycycline; Hydrocodone; Iohexol; Prilosec; Tylenol; Voltaren; and Penicillins  Home Medications   Current Outpatient Rx  Name Route Sig Dispense Refill  . ALBUTEROL SULFATE HFA 108 (90 BASE) MCG/ACT IN AERS Inhalation Inhale 2 puffs into the lungs every 6 (six) hours as needed. For shortness of breath.    . ALPRAZOLAM 0.25 MG PO TABS Oral Take 0.25 mg by mouth 3 (three) times daily as needed. For anxiety.    . AMLODIPINE BESYLATE 2.5 MG PO TABS Oral Take 2.5 mg by mouth daily.      Marland Kitchen CALCITONIN (SALMON) 200 UNIT/ACT NA SOLN Nasal Place 1 spray into the nose every other day.      Marland Kitchen CALCIUM CARBONATE 600 MG PO TABS Oral Take 600 mg by mouth 2 (two) times daily with a meal.      . CIPROFLOXACIN HCL 500 MG PO TABS Oral Take 500 mg by mouth 2 (two) times daily. Stopped by physician  on Feb 8th    . CYANOCOBALAMIN 1000 MCG/ML IJ SOLN Intramuscular Inject 1,000 mcg into the muscle every 30 (thirty) days.    . DULOXETINE HCL 60 MG PO CPEP Oral Take 60 mg by mouth daily.      . FENTANYL 75 MCG/HR TD PT72 Transdermal Place 1 patch onto the skin every other day.      Marland Kitchen GABAPENTIN (PHN) 300 MG PO TABS Oral Take 1 tablet by mouth daily.      Marland Kitchen MAGNESIUM OXIDE 400 MG PO TABS Oral Take 800 mg by mouth daily.    . MOMETASONE FURO-FORMOTEROL FUM 200-5 MCG/ACT IN AERO Inhalation Inhale 2 puffs into the lungs 2 (two) times daily.    Marland Kitchen NADOLOL 20 MG PO TABS Oral Take 20 mg by mouth daily.      Marland Kitchen POTASSIUM CHLORIDE 10 MEQ PO TBCR Oral Take 5-10 mEq by mouth daily. Takes 1 tablet daily Monday through Friday for dosage and takes 1/2 tablet Saturday and Sunday for dosage    . PREDNISONE 5 MG PO TABS Oral  Take 5 mg by mouth daily.     Marland Kitchen ALIGN 4 MG PO CAPS Oral Take 1 capsule by mouth daily.    Marland Kitchen RANITIDINE HCL 300 MG PO CAPS Oral Take 300 mg by mouth 2 (two) times daily.      Marland Kitchen SACCHAROMYCES BOULARDII 250 MG PO CAPS Oral Take 250 mg by mouth 2 (two) times daily.    Marland Kitchen SIMVASTATIN 80 MG PO TABS Oral Take 80 mg by mouth daily.      . SUCRALFATE 1 G PO TABS Oral Take 1 g by mouth 4 (four) times daily.    . TRAMADOL HCL 50 MG PO TABS Oral Take 100 mg by mouth every 8 (eight) hours as needed. Pain.Marland KitchenMarland KitchenMaximum dose= 8 tablets per day     . VITAMIN D (ERGOCALCIFEROL) 50000 UNITS PO CAPS Oral Take 50,000 Units by mouth every 7 (seven) days. Taken on Sundays.    . WARFARIN SODIUM 5 MG PO TABS Oral Take 2.5-5 mg by mouth daily. 1 tab daily except 0.5 tab daily on Mondays and Fridays.      BP 112/76  Pulse 84  Temp(Src) 97.9 F (36.6 C) (Oral)  Resp 16  SpO2 96%  Physical Exam  Nursing note and vitals reviewed. Constitutional: She appears well-developed and well-nourished. No distress.       Laying in bed. No acute distress.  HENT:  Head: Normocephalic and atraumatic.  Eyes: Conjunctivae are normal. Pupils are equal, round, and reactive to light. Right eye exhibits no discharge. Left eye exhibits no discharge.       Conjunctiva do not appear particularly pale.  Neck: Neck supple.  Cardiovascular: Normal rate, regular rhythm and normal heart sounds.  Exam reveals no gallop and no friction rub.   No murmur heard. Pulmonary/Chest: Effort normal and breath sounds normal. No respiratory distress.  Abdominal: Soft. She exhibits no distension and no mass. There is no tenderness. There is no rebound and no guarding.  Musculoskeletal: She exhibits no edema and no tenderness.  Neurological: She is alert.  Skin: Skin is warm and dry. She is not diaphoretic.  Psychiatric: She has a normal mood and affect. Her behavior is normal. Thought content normal.    ED Course  Procedures (including critical care  time)  Labs Reviewed  BASIC METABOLIC PANEL - Abnormal; Notable for the following:    GFR calc non Af Amer 53 (*)  GFR calc Af Amer 62 (*)    All other components within normal limits  PROTIME-INR - Abnormal; Notable for the following:    Prothrombin Time 64.6 (*)    INR 7.48 (*)    All other components within normal limits  IRON AND TIBC - Abnormal; Notable for the following:    Saturation Ratios 17 (*)    All other components within normal limits  CBC - Abnormal; Notable for the following:    RBC 2.34 (*)    Hemoglobin 7.4 (*)    HCT 23.3 (*)    RDW 17.9 (*)    All other components within normal limits  COMPREHENSIVE METABOLIC PANEL - Abnormal; Notable for the following:    Calcium 8.1 (*)    Total Protein 4.3 (*)    Albumin 2.3 (*)    GFR calc non Af Amer 62 (*)    GFR calc Af Amer 72 (*)    All other components within normal limits  PROTIME-INR - Abnormal; Notable for the following:    Prothrombin Time 18.6 (*)    INR 1.52 (*)    All other components within normal limits  CBC - Abnormal; Notable for the following:    RBC 3.13 (*)    Hemoglobin 9.9 (*)    HCT 29.4 (*)    RDW 19.4 (*)    All other components within normal limits  CBC - Abnormal; Notable for the following:    RBC 3.27 (*)    Hemoglobin 10.2 (*)    HCT 31.3 (*)    RDW 19.0 (*)    All other components within normal limits  BASIC METABOLIC PANEL - Abnormal; Notable for the following:    Calcium 8.3 (*)    GFR calc non Af Amer 66 (*)    GFR calc Af Amer 76 (*)    All other components within normal limits  TYPE AND SCREEN  VITAMIN B12  FOLATE  FERRITIN  PROTIME-INR  PREPARE RBC (CROSSMATCH)  PROTIME-INR  PROTIME-INR  LAB REPORT - SCANNED  BLOOD TRANSFUSION REPORT - SCANNED  CBC   No results found.  EKG:  Rhythm: Normal sinus rhythm Rate: 73 Axis: Normal Intervals: Normal ST segments: Ossific ST changes. There is T-wave flattening in V2.  1. GI bleed       MDM  71 year old  female with rectal bleeding. Patient was actually evaluated by her PCP, Dr. [name] emergency room. Plan is to admit patient for further evaluation.        Raeford Razor, MD 09/30/11 [phone]

## 2011-09-25 NOTE — ED Notes (Signed)
Patient here for low hemoglobin of 9.0 and passing blood in stool this am. Last recent hgb was 13. Dr. Clent Ridges referred patient here for further evaluation of same and patient is on coumadin. Patient pale on arrival.

## 2011-09-25 NOTE — Progress Notes (Signed)
ANTICOAGULATION CONSULT NOTE - Initial Consult  Pharmacy Consult for Coumadin Indication: Hx PE  Allergies  Allergen Reactions  . Celebrex (Celecoxib) Shortness Of Breath    No voiding  . Flagyl (Metronidazole Hcl)     Vaginal rash  . Smz-Tmp Ds (Sulfamethoxazole W/Trimethoprim (Co-Trimoxazole)) Rash  . Ceclor (Cefaclor) Other (See Comments)    Stomach pain  . Ciprofloxacin Hcl     Possible GI Bleed.  . Doxycycline     Stomach pains.  . Hydrocodone     Stomach pain  . Iohexol      Code: HIVES, Desc: pt states she breaks out in hives and sneezes 10/01/08   Desc: STATES SHE ONLY REACTS BY SNEEZING   . Prilosec (Omeprazole) Other (See Comments)    Causes ulcers in stomach and esophagus  . Tylenol (Acetaminophen) Other (See Comments)    liver  . Voltaren   . Penicillins Rash    Patient Measurements:     Vital Signs: Temp: 98.3 F (36.8 C) (02/12 2009) Temp src: Oral (02/12 2009) BP: 117/57 mmHg (02/12 2009) Pulse Rate: 73  (02/12 2009)  Labs:  Basename 09/25/11 1700  HGB --  HCT --  PLT --  APTT --  LABPROT 64.6*  INR 7.48*  HEPARINUNFRC --  CREATININE 1.05  CKTOTAL --  CKMB --  TROPONINI --   The CrCl is unknown because both a height and weight (above a minimum accepted value) are required for this calculation.  Medical History: Past Medical History  Diagnosis Date  . Polio   . Osteoporosis     multiple compression fractures  . Crohn's disease     since 1966 on chronic steroids s/p ileal resection, fistula repair  . Brain tumor   . Migraines   . Hypercholesteremia   . Hypertension   . GERD (gastroesophageal reflux disease)   . Anxiety   . Depression   . Emphysema   . Macular degeneration   . Cataract   . Arthritis   . Pernicious anemia   . Primary adrenal deficiency   . COPD (chronic obstructive pulmonary disease)   . Neuropathy, idiopathic   . Anticoagulated on warfarin     Hx of DVT,    Medications:   Scheduled:    .  amLODipine  2.5 mg Oral Daily  . calcium carbonate  1 tablet Oral BID WC  . cyanocobalamin  1,000 mcg Intramuscular Q30 days  . docusate sodium  100 mg Oral BID  . DULoxetine  60 mg Oral Daily  . fentaNYL  75 mcg Transdermal QODAY  . Flora-Q  1 capsule Oral Daily  . gabapentin  300 mg Oral Daily  . Mometasone Furo-Formoterol Fum  2 puff Inhalation BID  . nadolol  20 mg Oral Daily  . phytonadione (VITAMIN K) IV  5 mg Intravenous Once  . potassium chloride  5 mEq Oral Custom  . potassium chloride  10 mEq Oral Custom  . predniSONE  10 mg Oral Daily  . sucralfate  1 g Oral TID AC & HS  . Vitamin D (Ergocalciferol)  50,000 Units Oral Q7 days  . DISCONTD: ALIGN  1 capsule Oral Daily  . DISCONTD: calcium carbonate  600 mg Oral BID WC  . DISCONTD: Gabapentin (PHN)  1 tablet Oral Daily  . DISCONTD: potassium chloride  5-10 mEq Oral Daily  . DISCONTD: warfarin  2.5-5 mg Oral Daily   Infusions:    . 0.9 % NaCl with KCl 20 mEq / L  PRN: albuterol, ALPRAZolam, alum & mag hydroxide-simeth, HYDROmorphone, ondansetron (ZOFRAN) IV, ondansetron, traMADol, zolpidem  Assessment:  70 yof on chronic coumadin for hx PE. Admitted w/ Hgb drop and dark stools  INR is supratherapeutic. Vitamin K 5mg  IV x 1 given in ER ~2100. Repeat INR pending.  Home dose = 5mg  daily except 2.5mg  Mon and Fri  No overt bleeding reported. Positive fecal occult blood test.  Goal of Therapy:  INR 2-3   Plan:   No coumadin today  Daily INR  F/U MD plan (?switch to xarelto per notes).  Gwen Her PharmD  [phone] 09/25/2011 9:09 PM

## 2011-09-25 NOTE — H&P (Signed)
PCP:  Assunta Curtis Chief Complaint:  Anemia and dark stools  HPI: Pt was seen today by Dr Matthias Hughs for followup. Her Hgb was noted to be down from over 13 down to 9.1.  In fact in our office hemoglobin was 15.5 on January 18. The patient had been noting dark stools. He started last Wednesday. She had 5 the first and is at least one since then everyday. Today she was weaker and dizzier and was sure that she was having bleeding. [name] noticed more bruising on her arms. Tomorrow is the day for an INR check. Her by mouth intake has been minor and she's lost some weight. Her breathing is at baseline. She has no chest pain. She has some abdominal pain in the epigastric area as well as some nausea and anorexia. [name] had no vomiting. Her back pain is close to baseline. She hasn't been up much in the last couple days. [name] had no fevers or chills or sweats. She has some tremor but no other neurologic symptoms. Her vision is stable. [name] had no bleeding of her gums. She seen no blood in her urine.  Review of Systems:  Review of Systems - Negative except as above Past Medical History: Past Medical History  Diagnosis Date  . Polio   . Osteoporosis     multiple compression fractures  . Crohn's disease     since 1966 on chronic steroids s/p ileal resection, fistula repair  . Brain tumor   . Migraines   . Hypercholesteremia   . Hypertension   . GERD (gastroesophageal reflux disease)   . Anxiety   . Depression   . Emphysema   . Macular degeneration   . Cataract   . Arthritis   . Pernicious anemia   . Primary adrenal deficiency   . COPD (chronic obstructive pulmonary disease)   . Neuropathy, idiopathic   . Anticoagulated on warfarin     Hx of DVT,   Past Surgical History  Procedure Date  . Cholecystectomy   . Cataract repair   . Ileal resection   . Hemicolectomy     rt due to crohns  . Colonoscopy 08/17/2011    Procedure: COLONOSCOPY;  Surgeon: Freddy Jaksch, MD;  Location: [facility];  Service: Endoscopy;  Laterality: N/A;  Past Medical History (reviewed - no changes required): Crohn's dz 1966 Osteoporosis Colon polyps Pulmonary embolus Acute renal failure IGT Hyperlipidemia HTN Brain stem tumor Perirectal abscess GERD Hiatal hernia COPD Chronic pain Pancreatitis Polio Migraines Arthritis Fecal impaction Acute renal failure\par DVT  Nausea and vomiting with volume deficit, clinically improved.   2. Transient hypotension due to relative adrenal insufficiency.   3. Severe chronic obstructive pulmonary disease.   4. History of Crohn's disease for 45 years with multiple surgeries,       now stable on low dose steroids.   5. Osteoporosis with a multitude of compression fractures.   6. Treated vitamin D deficiency.   7. History of pulmonary embolism with negative VQ scan this admission.   8. Hypertension, improved on less medication.   9. Impaired glucose tolerance without significant flare this       hospitalization.   10.Depression/anxiety, under control.   11.History of pernicious anemia with stable status.   12.Enterococcal urinary tract infection.   13.Hypokalemia.  2011: MRI BRAIN   IMPRESSION:   Chronic microvascular ischemia.  No acute infarct.   11 mm enhancing mass enlarging the internal auditory canal on the  right, compatible with  an intracanalicular vestibular schwannoma. GI bleed 2012, colonoscopy with multiple polyps removed Surgical History (reviewed - no changes required): Hemicolectomy Ileocolonic anastomosis Cholecystectomy Skin graft Cataracts Toe straightening 2000:   I. SMALL BOWEL BIOPSY: UNREMARKABLE SMALL BOWEL MUCOSA. NO VILLOUS ATROPHY, INFLAMMATION OR OTHER ABNORMALITIES PRESENT.   II. ESOPHAGEAL BIOPSIES: INFLAMED SQUAMOUS MUCOSA WITH FEATURES OF SEVERE GASTROESOPHAGEAL REFLUX. NO INTESTINAL METASPLASIA, DYSPLASIA     III. COLON, PROXIMAL ULCERS: CHRONIC ACTIVE COLITIS WITH FOCAL ULCERATION CONSISTENT WITH  INFLAMMATORY BOWEL DISEASE. 2009 MRI CSPINE  1. Moderate right foraminal stenosis at C5-C6 due to spondylosis and a right-sided disc osteophyte complex.  There is possible right C6 nerve root encroachment.   2.  Spondylosis at C6-C7 results in mild biforaminal stenosis, but no cord deformity.   3.  No other significant disc space findings.   4.  No brain stem tumor demonstrated.  Correlate clinically.  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 CT 2013: IMPRESSION:  1.  Diffuse wall thickening of the distal small bowel is likely related to active Crohn's disease given the patient's history. Ischemic bowel could have a similar appearance. 2.  There is diffuse inflammatory change in the surrounding fat with a moderate amount of free peritoneal fluid.  This fluid measures higher than water density.  No focal fluid collections are identified. 3.  No evidence of high-grade bowel obstruction or extravasated enteric contrast. 4.  Moderate biliary dilatation status post cholecystectomy. 5.  Probable chronic thoracolumbar compression deformities, some not well visualized on prior study Family History (reviewed - no changes required): Mom died with CAD,  at 27 Dad killed in MVA at 91 Social History (reviewed - no changes required): Married. 1968 Smoker (1 ppd).  Social drinker.  Medications: Prior to Admission medications   Medication Sig Start Date End Date Taking? Authorizing Provider  ALPRAZolam (XANAX) 0.25 MG tablet Take 0.25 mg by mouth 3 (three) times daily.      Historical Provider, MD  amLODipine (NORVASC) 2.5 MG tablet Take 2.5 mg by mouth daily.      Historical Provider, MD  calcitonin, salmon, (MIACALCIN/FORTICAL) 200 UNIT/ACT nasal spray Place 1 spray into the nose every other day.      Historical Provider, MD  calcium carbonate (OS-CAL) 600 MG TABS Take 600 mg by mouth 2 (two) times daily with a meal.      Historical  Provider, MD  ciprofloxacin (CIPRO) 500 MG tablet Take 500 mg by mouth 2 (two) times daily.      Historical Provider, MD  DULoxetine (CYMBALTA) 60 MG capsule Take 60 mg by mouth daily.      Historical Provider, MD  fentaNYL (DURAGESIC - DOSED MCG/HR) 75 MCG/HR Place 1 patch onto the skin every other day.      Historical Provider, MD  Gabapentin, PHN, (GRALISE) 300 MG TABS Take 1 tablet by mouth daily.      Historical Provider, MD  magnesium oxide (MAG-OX) 400 MG tablet Take 1 tablet (400 mg total) by mouth 2 (two) times daily. 08/17/11 08/16/12  Julian Hy, MD  nadolol (CORGARD) 20 MG tablet Take 20 mg by mouth daily.      Historical Provider, MD  potassium chloride (KLOR-CON) 10 MEQ CR tablet Take 5-10 mEq by mouth as directed. Takes 1 tablet daily Monday through Friday for dosage and takes 1/2 tablet Saturday and Sunday for dosage     Historical Provider, MD  predniSONE (DELTASONE) 5 MG tablet Take 5 mg by mouth 2 (two) times daily.      Historical Provider, MD  ranitidine (ZANTAC) 300 MG capsule Take 300 mg by mouth 2 (two) times daily.      Historical Provider, MD  simvastatin (ZOCOR) 80 MG tablet Take 80 mg by mouth daily.      Historical Provider, MD  traMADol (ULTRAM) 50 MG tablet Take 100 mg by mouth every 8 (eight) hours as needed. Pain.Marland KitchenMarland KitchenMaximum dose= 8 tablets per day     Historical Provider, MD  Vitamin D, Ergocalciferol, (DRISDOL) 50000 UNITS CAPS Take 50,000 Units by mouth every 7 (seven) days.      Historical Provider, MD  warfarin (COUMADIN) 5 MG tablet Take 5 mg by mouth 2 (two) times daily.      Historical Provider, MD    Allergies:   Allergies  Allergen Reactions  . Celebrex (Celecoxib) Shortness Of Breath    No voiding  . Flagyl (Metronidazole Hcl)     Vaginal rash  . Smz-Tmp Ds (Sulfamethoxazole W/Trimethoprim (Co-Trimoxazole)) Rash  . Ceclor (Cefaclor) Other (See Comments)    Stomach pain  . Ciprofloxacin Hcl     Possible GI Bleed.  . Doxycycline      Stomach pains.  . Hydrocodone     Stomach pain  . Iohexol      Code: HIVES, Desc: pt states she breaks out in hives and sneezes 10/01/08   Desc: STATES SHE ONLY REACTS BY SNEEZING   . Prilosec (Omeprazole) Other (See Comments)    Causes ulcers in stomach and esophagus  . Tylenol (Acetaminophen) Other (See Comments)    liver  . Voltaren   . Penicillins Rash    Social History:  reports that she has been smoking Cigarettes.  She has been smoking about 1 pack per day. She does not have any smokeless tobacco history on file. She reports that she does not drink alcohol or use illicit drugs.  Family History: Family History  Problem Relation Age of Onset  . Anesthesia problems Neg Hx   . Hypotension Neg Hx   . Malignant hyperthermia Neg Hx   . Pseudochol deficiency Neg Hx     Physical Exam: Filed Vitals:   09/25/11 1630  BP: 112/76  Pulse: 84  Temp: 97.9 F (36.6 C)  TempSrc: Oral  Resp: 16  SpO2: 96%   General appearance: appears older than stated age, no distress and pale Head: Normocephalic, without obvious abnormality, atraumatic Eyes: Pupils are equal about 4 mm and reactive extraocular movements are intact.  no nystagmus there's no lid lag or exophthalmos   Throat: Her tongue is a bit brown from smoking. She is edentulous. Oral membranes are moist. Neck: no adenopathy, no carotid bruit, some external JVD is present.  Resp: diminished breath sounds bilaterally, she is markedly kyphotic. There is no wheeze heard. Cardio: regular rate and rhythm, with a systolic murmur. GI: soft, with multiple well-healed scars. She is slightly tender in the epigastrium with no pulsations or masses. Extremities: Slightly diminished pulses with 1+ edema. Chronic venous stasis changes and extensive scarring is present   Lymph nodes: Cervical adenopathy: no cervical lymphadenopathy Neurologic: Alert and oriented X 3, normal strength and tone.  She is mentating well. She has trace of  tremor. Skin exam: She has extensive bruising over both her arms. No evidence of oral bleeding..     Labs on Admission:    Lab Results  Component Value Date   INR 1.06  08/14/2011   INR 1.26 08/13/2011   INR 5.61* 08/12/2011    Radiological Exams on Admission: No results found. Orders placed during the hospital encounter of 08/12/11  . EKG   Results for MAFALDA, MCGINNISS (MRN [id number]) as of 09/25/2011 19:07  Ref. Range 09/25/2011 17:00  Sodium Latest Range: 135-145 mEq/L 143  Potassium Latest Range: 3.5-5.1 mEq/L 4.3  Chloride Latest Range: 96-112 mEq/L 108  CO2 Latest Range: 19-32 mEq/L 30  BUN Latest Range: 6-23 mg/dL 23  Creat Latest Range: 0.50-1.10 mg/dL 4.09  Calcium Latest Range: 8.4-10.5 mg/dL 8.5  GFR calc non Af Amer Latest Range: >90 mL/min 53 (L)  GFR calc Af Amer Latest Range: >90 mL/min 62 (L)  Glucose Latest Range: 70-99 mg/dL 98  INR 8,11 Assessment/Plan GI BLEEDING/SYMPTOMATIC ANEMIA: The patient seems to now have recurrent GI bleeding. Her hemoglobin is gone down roughly 4 g. She is not hemodynamically unstable. I suspect that her anticoagulation may be excessive. With this recurrent problem we need to consider whether Xarelto or a similar agent might be more appropriate. COPD, SEVERE: This is relatively stable the patient continues to smoke IGT: Last A1c was fine CROHNS: Is not clear if this is related to recent problems. PE/CHRONIC ANTICOAGULATION: INR is clearly up at 7.48. Pt is not actively bleeding to a degree to need FFP and is not hemodynamically unstable. Will Rx IV Vit K 5 mg CHRONIC PAIN: We'll try some Dilaudid in the hospital OSTEOPOROSIS/COMPRESSION FX: A very severe problem HYPERTENSION: Blood pressure seems fine DEPRESSION/ANXIETY: Seems to be doing okay GERD: Change to IV Protonix PERNICIOUS ANEMIA: Check B12 levels, last was low at 281 UNSTEADY GAIT: High risk of falls PVD: No pain at rest VIT D DEFICIENCY: Was reasonable a recent  check HYPERLIPIDEMIA: Continue Rx CODE STATUS: Full code  Julian Hy 09/25/2011, 4:59 PM

## 2011-09-26 LAB — IRON AND TIBC
Iron: 42 ug/dL (ref 42–135)
Saturation Ratios: 17 % — ABNORMAL LOW (ref 20–55)
TIBC: 251 ug/dL (ref 250–470)

## 2011-09-26 LAB — COMPREHENSIVE METABOLIC PANEL
AST: 12 U/L (ref 0–37)
Albumin: 2.3 g/dL — ABNORMAL LOW (ref 3.5–5.2)
BUN: 20 mg/dL (ref 6–23)
Calcium: 8.1 mg/dL — ABNORMAL LOW (ref 8.4–10.5)
Creatinine, Ser: 0.93 mg/dL (ref 0.50–1.10)
Total Protein: 4.3 g/dL — ABNORMAL LOW (ref 6.0–8.3)

## 2011-09-26 LAB — PROTIME-INR
INR: 1.52 — ABNORMAL HIGH (ref 0.00–1.49)
Prothrombin Time: 18.6 seconds — ABNORMAL HIGH (ref 11.6–15.2)
Prothrombin Time: 13.3 seconds (ref 11.6–15.2)

## 2011-09-26 LAB — CBC
HCT: 23.3 % — ABNORMAL LOW (ref 36.0–46.0)
MCH: 31.6 pg (ref 26.0–34.0)
MCHC: 31.8 g/dL (ref 30.0–36.0)
MCV: 99.6 fL (ref 78.0–100.0)
Platelets: 222 10*3/uL (ref 150–400)
RDW: 17.9 % — ABNORMAL HIGH (ref 11.5–15.5)
WBC: 7.2 10*3/uL (ref 4.0–10.5)

## 2011-09-26 LAB — PREPARE RBC (CROSSMATCH)

## 2011-09-26 LAB — VITAMIN B12: Vitamin B-12: 221 pg/mL (ref 211–911)

## 2011-09-26 LAB — FOLATE: Folate: 17.4 ng/mL

## 2011-09-26 MED ORDER — FUROSEMIDE 10 MG/ML IJ SOLN
20.0000 mg | Freq: Once | INTRAMUSCULAR | Status: AC
  Administered 2011-09-26: 13:00:00 via INTRAVENOUS
  Filled 2011-09-26: qty 2

## 2011-09-26 MED ORDER — PANTOPRAZOLE SODIUM 40 MG PO TBEC
40.0000 mg | DELAYED_RELEASE_TABLET | Freq: Every day | ORAL | Status: DC
  Administered 2011-09-26 – 2011-09-27 (×2): 40 mg via ORAL
  Filled 2011-09-26 (×3): qty 1

## 2011-09-26 MED ORDER — FLUTICASONE PROPIONATE 50 MCG/ACT NA SUSP
1.0000 | Freq: Every day | NASAL | Status: DC
  Administered 2011-09-26 – 2011-09-28 (×3): 1 via NASAL
  Filled 2011-09-26 (×2): qty 16

## 2011-09-26 NOTE — Progress Notes (Signed)
Subjective: Some waves of nausea. Some abd pain. No BM since here. Breathing is OK Some sore throat and ear pain. No CP   Objective: Vital signs in last 24 hours: Temp:  [97.9 F (36.6 C)-98.7 F (37.1 C)] 98.2 F (36.8 C) (02/13 0624) Pulse Rate:  [73-88] 77  (02/13 0624) Resp:  [16-18] 16  (02/13 0624) BP: (106-121)/(57-89) 115/68 mmHg (02/13 0624) SpO2:  [92 %-96 %] 93 % (02/13 0624) Weight:  [53.4 kg (117 lb 11.6 oz)] 53.4 kg (117 lb 11.6 oz) (02/12 2117)  Intake/Output from previous day: 02/12 0701 - 02/13 0700 In: 51.1 [I.V.:51.1] Out: 500 [Urine:500] Intake/Output this shift:    General: appears older than stated age and pale Eyes: Pupils are equal about 4 mm and reactive extraocular movements are intact. no nystagmus there's no lid lag or exophthalmos  Throat: Her tongue is a bit brown from smoking. She is edentulous. Oral membranes are moist.  Neck: no adenopathy, no carotid bruit, some external JVD is present.  Resp: diminished breath sounds bilaterally, she is markedly kyphotic. There is no wheeze heard.  Cardio: regular rate and rhythm, with a systolic murmur.  GI: soft, with multiple well-healed scars. She is slightly tender in the epigastrium with no pulsations or masses. BS's are present and a bit hypoactive but with some rushes Extremities: Slightly diminished pulses with 1+ edema. Chronic venous stasis changes and extensive scarring is present  Lymph nodes: Cervical adenopathy: no cervical lymphadenopathy  Neurologic: Alert and oriented X 3, normal strength and tone. She is mentating well. She has trace of tremor.  Skin exam: She has extensive bruising over both her arms. No evidence of oral bleeding..   Lab Results   [facility] 09/26/11 0321  WBC 7.2  RBC 2.34*  HGB 7.4*  HCT 23.3*  MCV 99.6  MCH 31.6  RDW 17.9*  PLT 222    Basename 09/26/11 0321 09/25/11 1700  NA 141 143  K 3.8 4.3  CL 109 108  CO2 28 30  GLUCOSE 72 98  BUN 20 23  CREATININE  0.93 1.05  CALCIUM 8.1* 8.5   Results for SILVA, AAMODT (MRN [id number]) as of 09/26/2011 09:41  Ref. Range 09/25/2011 17:00 09/26/2011 03:21  Prothrombin Time Latest Range: 11.6-15.2 seconds 64.6 (H) 18.6 (H)  INR Latest Range: 0.00-1.49  7.48 (HH) 1.52 (H)   Studies/Results: No results found.  Scheduled Meds:   . amLODipine  2.5 mg Oral Daily  . calcium carbonate  1 tablet Oral BID WC  . cyanocobalamin  1,000 mcg Intramuscular Q30 days  . docusate sodium  100 mg Oral BID  . DULoxetine  60 mg Oral Daily  . fentaNYL  75 mcg Transdermal QODAY  . Flora-Q  1 capsule Oral Daily  . gabapentin  300 mg Oral Daily  . Mometasone Furo-Formoterol Fum  2 puff Inhalation BID  . nadolol  20 mg Oral Daily  . phytonadione (VITAMIN K) IV  5 mg Intravenous Once  . potassium chloride  5 mEq Oral Custom  . potassium chloride  10 mEq Oral Custom  . predniSONE  10 mg Oral Daily  . sucralfate  1 g Oral TID AC & HS  . Vitamin D (Ergocalciferol)  50,000 Units Oral Q7 days  . DISCONTD: ALIGN  1 capsule Oral Daily  . DISCONTD: calcium carbonate  600 mg Oral BID WC  . DISCONTD: Gabapentin (PHN)  1 tablet Oral Daily  . DISCONTD: potassium chloride  5-10 mEq Oral Daily  .  DISCONTD: warfarin  2.5-5 mg Oral Daily   Continuous Infusions:   . 0.9 % NaCl with KCl 20 mEq / L 75 mL/hr at 09/25/11 2200   PRN Meds:albuterol, ALPRAZolam, alum & mag hydroxide-simeth, HYDROmorphone, ondansetron (ZOFRAN) IV, ondansetron, traMADol, zolpidem  Assessment/Plan:  GI BLEEDING/SYMPTOMATIC ANEMIA: Hgb down to 7.4. Still hemodyn stable. Transfuse today COPD, SEVERE: This is relatively stable the patient continues to smoke  IGT: Last A1c was fine  CROHNS: Is not clear if this is related to recent problems.  PE/CHRONIC ANTICOAGULATION:Single dose of Vit K has normalized INR. Probably will NOT return to coumadin but instead consider one of the other oral anticoag CHRONIC PAIN: We'll try some Dilaudid in the hospital    OSTEOPOROSIS/COMPRESSION FX: A very severe problem  HYPERTENSION: Blood pressure seems fine  DEPRESSION/ANXIETY: Seems to be doing okay  GERD: Change to IV Protonix  PERNICIOUS ANEMIA: Check B12 levels, last was low at 281  UNSTEADY GAIT: High risk of falls  PVD: No pain at rest  VIT D DEFICIENCY: Was reasonable a recent check  HYPERLIPIDEMIA: Continue Rx  CODE STATUS: Full code   LOS: 1 day   Katherine Mckay [name] 09/26/2011, 9:43 AM

## 2011-09-26 NOTE — Progress Notes (Signed)
ANTICOAGULATION CONSULT NOTE - Follow Up Consult  Pharmacy Consult for Coumadin Indication: Hx PE,   Allergies  Allergen Reactions  . Celebrex (Celecoxib) Shortness Of Breath    No voiding  . Flagyl (Metronidazole Hcl)     Vaginal rash  . Smz-Tmp Ds (Sulfamethoxazole W/Trimethoprim (Co-Trimoxazole)) Rash  . Ceclor (Cefaclor) Other (See Comments)    Stomach pain  . Ciprofloxacin Hcl     Possible GI Bleed.  . Doxycycline     Stomach pains.  . Hydrocodone     Stomach pain  . Iohexol      Code: HIVES, Desc: pt states she breaks out in hives and sneezes 10/01/08   Desc: STATES SHE ONLY REACTS BY SNEEZING   . Prilosec (Omeprazole) Other (See Comments)    Causes ulcers in stomach and esophagus  . Tylenol (Acetaminophen) Other (See Comments)    liver  . Voltaren   . Penicillins Rash    Patient Measurements: Height: 5\' 3"  (160 cm) Weight: 117 lb 11.6 oz (53.4 kg) IBW/kg (Calculated) : 52.4   Vital Signs: Temp: 98.2 F (36.8 C) (02/13 1045) Temp src: Oral (02/13 1045) BP: 113/69 mmHg (02/13 1045) Pulse Rate: 72  (02/13 1045)  Labs:  Basename 09/26/11 0321 09/25/11 1700  HGB 7.4* --  HCT 23.3* --  PLT 222 --  APTT -- --  LABPROT 18.6* 64.6*  INR 1.52* 7.48*  HEPARINUNFRC -- --  CREATININE 0.93 1.05  CKTOTAL -- --  CKMB -- --  TROPONINI -- --   Estimated Creatinine Clearance: 47.9 ml/min (by C-G formula based on Cr of 0.93).   Medications:  Scheduled:    . amLODipine  2.5 mg Oral Daily  . calcium carbonate  1 tablet Oral BID WC  . cyanocobalamin  1,000 mcg Intramuscular Q30 days  . docusate sodium  100 mg Oral BID  . DULoxetine  60 mg Oral Daily  . fentaNYL  75 mcg Transdermal QODAY  . Flora-Q  1 capsule Oral Daily  . fluticasone  1 spray Each Nare Daily  . furosemide  20 mg Intravenous Once  . gabapentin  300 mg Oral Daily  . Mometasone Furo-Formoterol Fum  2 puff Inhalation BID  . nadolol  20 mg Oral Daily  . pantoprazole  40 mg Oral Q1200  .  phytonadione (VITAMIN K) IV  5 mg Intravenous Once  . potassium chloride  5 mEq Oral Custom  . potassium chloride  10 mEq Oral Custom  . predniSONE  10 mg Oral Daily  . sucralfate  1 g Oral TID AC & HS  . Vitamin D (Ergocalciferol)  50,000 Units Oral Q7 days  . DISCONTD: ALIGN  1 capsule Oral Daily  . DISCONTD: calcium carbonate  600 mg Oral BID WC  . DISCONTD: Gabapentin (PHN)  1 tablet Oral Daily  . DISCONTD: potassium chloride  5-10 mEq Oral Daily  . DISCONTD: warfarin  2.5-5 mg Oral Daily    Assessment:  71 yo F admit with GI bleed, hx Crohn's dz, source unknown  Chronic Coumadin for PE, home dose 2.5mg  M,F; 5mg  other days.  INR supratherapeutic on admit 7.48, pt received Vit K 5mg  IV x1 2/12  INR 1.52 this am, holding anti-coagulation,plan transfusion.  Plan to resume anti-coagulation with alternative agent when appropriate. Goal of Therapy:  Therapeutic INR is 2-3.   Plan:  Holding Coumadin, anti-coagulation. Work-up for GI bleed in process.  Otho Bellows PharmD Pager [phone] 09/26/2011,10:55 AM

## 2011-09-26 NOTE — Progress Notes (Signed)
Subjective: Black stool with anemia necessitating admission.  Objective: Vital signs in last 24 hours: Temp:  [97.9 F (36.6 C)-98.7 F (37.1 C)] 98.5 F (36.9 C) (02/13 1323) Pulse Rate:  [72-88] 79  (02/13 1323) Resp:  [16-18] 17  (02/13 1323) BP: (89-121)/(52-89) 89/52 mmHg (02/13 1323) SpO2:  [84 %-96 %] 84 % (02/13 1323) Weight:  [53.4 kg (117 lb 11.6 oz)] 53.4 kg (117 lb 11.6 oz) (02/12 2117) Weight change:  Last BM Date: 09/25/11  PE: GEN:  Chronically ill-appearing ABD:  Soft.  Lab Results:  CBC    Component Value Date/Time   WBC 7.2 09/26/2011 0321   RBC 2.34* 09/26/2011 0321   HGB 7.4* 09/26/2011 0321   HCT 23.3* 09/26/2011 0321   PLT 222 09/26/2011 0321   MCV 99.6 09/26/2011 0321   MCH 31.6 09/26/2011 0321   MCHC 31.8 09/26/2011 0321   RDW 17.9* 09/26/2011 0321   LYMPHSABS 0.7 08/13/2011 1850   MONOABS 0.6 08/13/2011 1850   EOSABS 0.0 08/13/2011 1850   BASOSABS 0.0 08/13/2011 1850   Assessment:  1.  Overt obscure GIB.  Given melena, with recent negative endoscopy and colonoscopy, suspect proximal small bowel source.  Certainly her coagulopathy is playing large role in this process. 2.  Coagulopathy.  INR corrected, ~7.5-->~1.5.  Plan:  1.  Liquid diet OK. 2.  NPO after midnight. 3.  Capsule endoscopy in the morning.   Freddy Jaksch 09/26/2011, 2:59 PM

## 2011-09-27 ENCOUNTER — Encounter (HOSPITAL_COMMUNITY)
Admission: EM | Disposition: A | Discharge status: Home / Self Care | Payer: Self-pay | Source: Home / Self Care | Attending: Endocrinology

## 2011-09-27 HISTORY — PX: GIVENS CAPSULE STUDY: SHX5432

## 2011-09-27 LAB — TYPE AND SCREEN
ABO/RH(D): A NEG
Antibody Screen: NEGATIVE
Unit division: 0
Unit division: 0

## 2011-09-27 LAB — CBC
HCT: 29.4 % — ABNORMAL LOW (ref 36.0–46.0)
Hemoglobin: 9.9 g/dL — ABNORMAL LOW (ref 12.0–15.0)
MCHC: 33.7 g/dL (ref 30.0–36.0)
Platelets: 207 10*3/uL (ref 150–400)
RDW: 19.4 % — ABNORMAL HIGH (ref 11.5–15.5)
WBC: 7.1 10*3/uL (ref 4.0–10.5)

## 2011-09-27 LAB — PROTIME-INR: INR: 1.12 (ref 0.00–1.49)

## 2011-09-27 SURGERY — IMAGING PROCEDURE, GI TRACT, INTRALUMINAL, VIA CAPSULE
Anesthesia: LOCAL

## 2011-09-27 SURGICAL SUPPLY — 1 items: TOWEL COTTON PACK 4EA (MISCELLANEOUS) ×4 IMPLANT

## 2011-09-27 NOTE — Progress Notes (Signed)
Subjective: Doing fairly well overall. Less abd pain. Had a black stool last night. Breathing OK Objective: Vital signs in last 24 hours: Temp:  [97.8 F (36.6 C)-98.5 F (36.9 C)] 98 F (36.7 C) (02/14 0605) Pulse Rate:  [68-79] 73  (02/14 0605) Resp:  [16-17] 16  (02/14 0605) BP: (89-130)/(52-71) 130/69 mmHg (02/14 0605) SpO2:  [84 %-92 %] 92 % (02/14 0605)  Intake/Output from previous day: 02/13 0701 - 02/14 0700 In: 2335 [P.O.:550; I.V.:1500; Blood:285] Out: 2300 [Urine:2300] Intake/Output this shift:    General: pale anicteric, oral membranes moist, Neck clear. Lungs clear Ht Regular distant.Abd soft less distended. Some tremor  Lab Results   Basename 09/27/11 0320 09/26/11 0321  WBC 7.1 7.2  RBC 3.13* 2.34*  HGB 9.9* 7.4*  HCT 29.4* 23.3*  MCV 93.9 99.6  MCH 31.6 31.6  RDW 19.4* 17.9*  PLT 207 222    Basename 09/26/11 0321 09/25/11 1700  NA 141 143  K 3.8 4.3  CL 109 108  CO2 28 30  GLUCOSE 72 98  BUN 20 23  CREATININE 0.93 1.05  CALCIUM 8.1* 8.5    Studies/Results: No results found.  Scheduled Meds:   . amLODipine  2.5 mg Oral Daily  . calcium carbonate  1 tablet Oral BID WC  . cyanocobalamin  1,000 mcg Intramuscular Q30 days  . docusate sodium  100 mg Oral BID  . DULoxetine  60 mg Oral Daily  . fentaNYL  75 mcg Transdermal QODAY  . Flora-Q  1 capsule Oral Daily  . fluticasone  1 spray Each Nare Daily  . furosemide  20 mg Intravenous Once  . gabapentin  300 mg Oral Daily  . Mometasone Furo-Formoterol Fum  2 puff Inhalation BID  . nadolol  20 mg Oral Daily  . pantoprazole  40 mg Oral Q1200  . potassium chloride  5 mEq Oral Custom  . potassium chloride  10 mEq Oral Custom  . predniSONE  10 mg Oral Daily  . sucralfate  1 g Oral TID AC & HS  . Vitamin D (Ergocalciferol)  50,000 Units Oral Q7 days   Continuous Infusions:   . 0.9 % NaCl with KCl 20 mEq / L 75 mL/hr at 09/26/11 1800   PRN Meds:albuterol, ALPRAZolam, alum & mag  hydroxide-simeth, HYDROmorphone, ondansetron (ZOFRAN) IV, ondansetron, traMADol, zolpidem  Assessment/Plan:  GI BLEEDING/SYMPTOMATIC ANEMIA: Hgb up to 9.9. Still hemodyn stable. Getting a capsule endoscopy  COPD, SEVERE: This is relatively stable the patient continues to smoke  IGT: Last A1c was fine  CROHNS: Is not clear if this is related to recent problems.  PE/CHRONIC ANTICOAGULATION Plan to go with Xarelto 20. If rebleeds, IVC filter CHRONIC PAIN: We'll try some Dilaudid in the hospital  OSTEOPOROSIS/COMPRESSION FX: A very severe problem  HYPERTENSION: Blood pressure seems fine  DEPRESSION/ANXIETY: Seems to be doing okay  GERD: Change to IV Protonix  PERNICIOUS ANEMIA: Check B12 levels, last was low at 281  UNSTEADY GAIT: High risk of falls  PVD: No pain at rest  VIT D DEFICIENCY: Was reasonable a recent check  HYPERLIPIDEMIA: Continue Rx  CODE STATUS: Full code   LOS: 2 days   Katherine Mckay [name] 09/27/2011, 9:16 AM

## 2011-09-27 NOTE — Progress Notes (Signed)
GASTROENTEROLOGY PROGRESS NOTE  Problem:   GI bleed  Subjective: Feels ok. States her stool is no longer black.  Objective: Alert, NAD.  Appropriate rise in Hgb from 7.4 to 9.9 after Tx of 2 units of prc's. Assessment: Quiescent GIB which was apparently secondary to over-anticoagulation.  Plan: Await results of today's capsule study of the small bowel (recent egd and colonoscpy neg for bleeding source).  Hopefully will have capsule study read by mid-morning tomorrow; however, if the patient is clinically stable, ok from my standpoint for dischg in a.m., even if capsule study is not complete.  Katherine Mckay, M.D. 09/27/2011 1:44 PM

## 2011-09-27 NOTE — Progress Notes (Signed)
[employer] capsule equipment retrieved from pt.

## 2011-09-28 LAB — BASIC METABOLIC PANEL
CO2: 26 mEq/L (ref 19–32)
Calcium: 8.3 mg/dL — ABNORMAL LOW (ref 8.4–10.5)
Creatinine, Ser: 0.88 mg/dL (ref 0.50–1.10)
GFR calc Af Amer: 76 mL/min — ABNORMAL LOW (ref >90)
GFR calc non Af Amer: 66 mL/min — ABNORMAL LOW (ref >90)
Sodium: 141 mEq/L (ref 135–145)

## 2011-09-28 LAB — CBC
MCH: 31.2 pg (ref 26.0–34.0)
MCHC: 32.6 g/dL (ref 30.0–36.0)
MCV: 95.7 fL (ref 78.0–100.0)
Platelets: 233 10*3/uL (ref 150–400)
RBC: 3.27 MIL/uL — ABNORMAL LOW (ref 3.87–5.11)
RDW: 19 % — ABNORMAL HIGH (ref 11.5–15.5)

## 2011-09-28 LAB — PROTIME-INR: Prothrombin Time: 13.4 seconds (ref 11.6–15.2)

## 2011-09-28 MED ORDER — SIMVASTATIN 80 MG PO TABS: 80.0000 mg | ORAL_TABLET | Freq: Every day | ORAL | Status: DC

## 2011-09-28 MED ORDER — ZOLPIDEM TARTRATE 5 MG PO TABS: 5.0000 mg | ORAL_TABLET | Freq: Every evening | ORAL | Status: DC | PRN

## 2011-09-28 NOTE — Progress Notes (Signed)
Patient discharged home with spouse in stable condition. Discharge instructions and scripts given. Pt and husband verbalized understanding.

## 2011-09-28 NOTE — Discharge Summary (Signed)
DISCHARGE SUMMARY  Katherine Mckay  MR#: [id number]  DOB:07-Nov-1940  Date of Admission: 09/25/2011 Date of Discharge: 09/28/2011  Attending Physician:Nikolette Reindl [name]  Patient's WUJ:Katherine Mckay  Consults:Treatment Team:  Florencia Reasons, MD Procedures: Capsule endoscopy  Discharge Diagnoses: Active Problems:  GI bleed: Clinically improved after transfusion  Abdominal pain: Clinically improved  Supratherapeutic INR: Now reversed  Adrenal insufficiency: Chronic, due to steroid therapy  Crohn's disease: Stable without flares  Anemia due to blood loss, acute: Improved after transfusion  Chronic pain: Stable on medications  Recurrent pulmonary embolism: Now off Coumadin with planned Xarelto. Therapy Vitamin D deficiency on therapy Hypertension: Under control Severe COPD: Stable without oxygen requirements at the present Hyperlipidemia on therapy Severe osteoporosis with history of multiple fractures Pernicious anemia: On therapy Neuropathy: Slightly flared Depression and anxiety: On therapy Impaired glucose tolerance: Stable sugars Chronic protein calorie malnutrition with albumin of 2.3   Discharge Medications: Medication List  As of 09/28/2011  8:23 AM   STOP taking these medications         ciprofloxacin 500 MG tablet      warfarin 5 MG tablet         TAKE these medications         albuterol 108 (90 BASE) MCG/ACT inhaler   Commonly known as: PROVENTIL HFA;VENTOLIN HFA   Inhale 2 puffs into the lungs every 6 (six) hours as needed. For shortness of breath.      ALIGN 4 MG Caps   Take 1 capsule by mouth daily.      ALPRAZolam 0.25 MG tablet   Commonly known as: XANAX   Take 0.25 mg by mouth 3 (three) times daily as needed. For anxiety.      amLODipine 2.5 MG tablet   Commonly known as: NORVASC   Take 2.5 mg by mouth daily.      calcitonin (salmon) 200 UNIT/ACT nasal spray   Commonly known as: MIACALCIN/FORTICAL   Place 1 spray into the nose every other  day.      calcium carbonate 600 MG Tabs   Commonly known as: OS-CAL   Take 600 mg by mouth 2 (two) times daily with a meal.      cyanocobalamin 1000 MCG/ML injection   Commonly known as: (VITAMIN B-12)   Inject 1,000 mcg into the muscle every 30 (thirty) days.      DULERA 200-5 MCG/ACT Aero   Generic drug: Mometasone Furo-Formoterol Fum   Inhale 2 puffs into the lungs 2 (two) times daily.      DULoxetine 60 MG capsule   Commonly known as: CYMBALTA   Take 60 mg by mouth daily.      fentaNYL 75 MCG/HR   Commonly known as: DURAGESIC - dosed mcg/hr   Place 1 patch onto the skin every other day.      GRALISE 300 MG Tabs   Generic drug: Gabapentin (PHN)   Take 1 tablet by mouth daily.      magnesium oxide 400 MG tablet   Commonly known as: MAG-OX   Take 800 mg by mouth daily.      nadolol 20 MG tablet   Commonly known as: CORGARD   Take 20 mg by mouth daily.      potassium chloride 10 MEQ CR tablet   Commonly known as: KLOR-CON   Take 5-10 mEq by mouth daily. Takes 1 tablet daily Monday through Friday for dosage and takes 1/2 tablet Saturday and Sunday for dosage  predniSONE 5 MG tablet   Commonly known as: DELTASONE   Take 5 mg by mouth daily.      ranitidine 300 MG capsule   Commonly known as: ZANTAC   Take 300 mg by mouth 2 (two) times daily.      saccharomyces boulardii 250 MG capsule   Commonly known as: FLORASTOR   Take 250 mg by mouth 2 (two) times daily.      simvastatin 80 MG tablet   Commonly known as: ZOCOR   Take 1 tablet (80 mg total) by mouth daily.      sucralfate 1 G tablet   Commonly known as: CARAFATE   Take 1 g by mouth 4 (four) times daily.      traMADol 50 MG tablet   Commonly known as: ULTRAM   Take 100 mg by mouth every 8 (eight) hours as needed. Pain.Marland KitchenMarland KitchenMaximum dose= 8 tablets per day      Vitamin D (Ergocalciferol) 50000 UNITS Caps   Commonly known as: DRISDOL   Take 50,000 Units by mouth every 7 (seven) days. Taken on  Sundays.      zolpidem 5 MG tablet   Commonly known as: AMBIEN   Take 1 tablet (5 mg total) by mouth at bedtime as needed for sleep (insomnia).            Hospital Procedures: No results found.  History of Present Illness: 71 year old who presented with black stools and low hemoglobin  Hospital Course: Katherine Mckay is a 71 year old white female with a variety of chronic medical problems. She visited her gastroenterologist with complaints of dark stools for several days and was found to have a hemoglobin that dropped 4 g. She was hemodynamically stable but this indicated GI bleeding in the setting of multiple chronic problems. She was brought in and hydrated and found to have a hemoglobin that dropped as low as 7.4. She underwent 2 units blood transfusion  fortunately her hemoglobin has remained stable thereafter. She underwent capsule endoscopy yesterday we do not know the results of this. She did have some abdominal pain earlier hospitalization is resolved. She just recently undergone both endoscopy and colonoscopy with findings of multiple polyps but no other clear bleeding source. Her INR was again supratherapeutic despite a reduction in her dose to 6 days prior. It appears she is no longer a Coumadin candidate due to the variability of her response. A variety of treatments were entertained. We're going to give her a few days without any anticoagulation and then try starting Xarelto as a prophylactic intervention. If she bleeds on this, we'll have to consider a vena cava filter. Her frailty and other issues would make this more dangerous. Her lung disease is done well while here. She does consider to smoke at home. Her chronic pain issues are fair. Her severe osteoporosis remains a challenge. There is no evidence that her Crohn's disease was the cause of this problem. [name] had no infectious problems while here. Her diet was advanced yesterday she is tolerating this. [name] not been out of bed much.  He levels remained stable overnight and she's had no bowel movements in last 24 hours. I do not think any more active bleeding is going on. Day of Discharge Exam BP 132/77  Pulse 69  Temp(Src) 98 F (36.7 C) (Oral)  Resp 16  Ht 5\' 3"  (1.6 m)  Wt 53.071 kg (117 lb)  BMI 20.73 kg/m2  SpO2 91%  Physical Exam: General appearance: appears older than stated age,  no distress and pale Eyes: no scleral icterus Throat: oropharynx moist without erythema, edentulous Resp: distant, no wheeze, increased AP diameter Cardio: regular rate and rhythm GI: soft, non-tender; bowel sounds normal; no masses,  no organomegaly, no masses Extremities: no clubbing, cyanosis or edema Neuro: Awake alert and mentating well, mild tremor Skin: Thin with multiple bruises and chronic scarring  Discharge Labs:  [facility] 09/28/11 0325 09/26/11 0321  NA 141 141  K 3.8 3.8  CL 107 109  CO2 26 28  GLUCOSE 80 72  BUN 17 20  CREATININE 0.88 0.93  CALCIUM 8.3* 8.1*  MG -- --  PHOS -- --    Basename 09/26/11 0321  AST 12  ALT 8  ALKPHOS 40  BILITOT 0.3  PROT 4.3*  ALBUMIN 2.3*    Basename 09/28/11 0325 09/27/11 0320  WBC 8.8 7.1  NEUTROABS -- --  HGB 10.2* 9.9*  HCT 31.3* 29.4*  MCV 95.7 93.9  PLT 233 207   Lab Results  Component Value Date   INR 1.00 09/28/2011   INR 1.12 09/27/2011   INR 0.99 09/26/2011   No results found for this basename: CKTOTAL:3,CKMB:3,CKMBINDEX:3,TROPONINI:3 in the last 72 hours No results found for this basename: TSH,T4TOTAL,FREET3,T3FREE,THYROIDAB in the last 72 hours  Basename 09/25/11 1759  VITAMINB12 221  FOLATE 17.4  FERRITIN 29  TIBC 251  IRON 42  RETICCTPCT --    Discharge instructions:   Disposition: To home  Follow-up Appts: Follow-up with Dr. Evlyn Mckay at [facility] in a week.  Call for appointment.  Condition on Discharge: Improved  Tests Needing Follow-up: Capsule endoscopy  Signed: Julian Hy 09/28/2011, 8:23 AM

## 2011-09-28 NOTE — Progress Notes (Signed)
Stable hemoglobin noted.  Capsule study of the small bowel has been completed, and is negative for any significant lesions. There was one tiny red spot, probably a small AVM. However, no blood was present in the GI tract, nor were any significant lesions noted which might account for her recent severe bleeding.  In view of this, plus her recently unrevealing colonoscopy and endoscopy, the latter of which was done as an outpatient at our office endoscopy unit, I feel that it is okay for this patient to have resumption of anticoagulation.   Although she has had 2 episodes of GI bleeding recently, both of these occurred while supratherapeutic on Coumadin. She had been on Coumadin for about a year and a half prior to that without any incidents, and a current complete GI tract evaluation shows no underlying pathologic lesions which would constitute a contraindication to resumption of anticoagulation.  I did not see the patient today, but I did discuss these findings with her by telephone. I have also discussed them with her primary physician, Dr. Ardyth Harps, by telephone.  Agree with plan for discharge today.  Florencia Reasons, M.D. [phone]

## 2011-10-01 ENCOUNTER — Encounter (HOSPITAL_COMMUNITY): Payer: Self-pay | Admitting: Gastroenterology

## 2011-12-17 ENCOUNTER — Encounter (HOSPITAL_COMMUNITY): Payer: Self-pay

## 2011-12-17 ENCOUNTER — Emergency Department (HOSPITAL_COMMUNITY)
Admission: EM | Admit: 2011-12-17 | Discharge: 2011-12-17 | Disposition: A | Discharge status: Home / Self Care | Payer: Medicare Other | Attending: Emergency Medicine | Admitting: Emergency Medicine

## 2011-12-17 DIAGNOSIS — M5432 Sciatica, left side: Secondary | ICD-10-CM

## 2011-12-17 DIAGNOSIS — K5090 Crohn's disease, unspecified, without complications: Secondary | ICD-10-CM | POA: Insufficient documentation

## 2011-12-17 DIAGNOSIS — M5430 Sciatica, unspecified side: Secondary | ICD-10-CM | POA: Insufficient documentation

## 2011-12-17 DIAGNOSIS — J449 Chronic obstructive pulmonary disease, unspecified: Secondary | ICD-10-CM | POA: Insufficient documentation

## 2011-12-17 DIAGNOSIS — E7800 Pure hypercholesterolemia, unspecified: Secondary | ICD-10-CM | POA: Insufficient documentation

## 2011-12-17 DIAGNOSIS — M79609 Pain in unspecified limb: Secondary | ICD-10-CM | POA: Insufficient documentation

## 2011-12-17 DIAGNOSIS — M545 Low back pain, unspecified: Secondary | ICD-10-CM | POA: Insufficient documentation

## 2011-12-17 DIAGNOSIS — I10 Essential (primary) hypertension: Secondary | ICD-10-CM | POA: Insufficient documentation

## 2011-12-17 DIAGNOSIS — J4489 Other specified chronic obstructive pulmonary disease: Secondary | ICD-10-CM | POA: Insufficient documentation

## 2011-12-17 DIAGNOSIS — M810 Age-related osteoporosis without current pathological fracture: Secondary | ICD-10-CM | POA: Insufficient documentation

## 2011-12-17 DIAGNOSIS — Z8612 Personal history of poliomyelitis: Secondary | ICD-10-CM | POA: Insufficient documentation

## 2011-12-17 DIAGNOSIS — K219 Gastro-esophageal reflux disease without esophagitis: Secondary | ICD-10-CM | POA: Insufficient documentation

## 2011-12-17 MED ORDER — HYDROMORPHONE HCL PF 1 MG/ML IJ SOLN
1.0000 mg | Freq: Once | INTRAMUSCULAR | Status: AC
  Administered 2011-12-17: 1 mg via INTRAMUSCULAR
  Filled 2011-12-17: qty 1

## 2011-12-17 MED ORDER — OXYCODONE HCL 5 MG PO TABS: 5.0000 mg | ORAL_TABLET | ORAL | Status: AC | PRN

## 2011-12-17 MED ORDER — OXYCODONE-ACETAMINOPHEN 5-325 MG PO TABS: 1.0000 | ORAL_TABLET | ORAL | Status: DC | PRN

## 2011-12-17 MED ORDER — POLYETHYLENE GLYCOL 3350 17 G PO PACK: 17.0000 g | PACK | Freq: Every day | ORAL | Status: AC

## 2011-12-17 NOTE — ED Provider Notes (Signed)
History     CSN: [id number]  Arrival date & time 12/17/11  1644   First MD Initiated Contact with Patient 12/17/11 1736      No chief complaint on file.   (Consider location/radiation/quality/duration/timing/severity/associated sxs/prior treatment) The history is provided by the patient and the spouse.   the patient reports 3-4 days of worsening pain in her left lower back radiating down her left buttock and down her left leg.  She denies weakness of her lower extremities.  [name] had no fevers or chills.  She denies nausea vomiting or abdominal pain.  She is had no numbness to her lower extremities.  She was seen and evaluated by her orthopedist today who is concerned about treating her pain because of the possibility of constipation noted on a CT scan 5 days ago.  The patient reports she was having an outpatient CT of her abdomen and pelvis completed to evaluate for possible inguinal hernia and it was noted on her CT scan that she had a large stool burden in her colon without inflammatory changes.  The patient reports she had several fullness stools after the CT scan which he thought was secondary to the contrast.  She reports her bowels are now moving normally.  She reports her main reason for presenting to the ER at into her orthopedist as the pain radiating down her left leg.  She has no new abdominal swelling for the patient  Past Medical History  Diagnosis Date  . Polio   . Osteoporosis     multiple compression fractures  . Crohn's disease     since 1966 on chronic steroids s/p ileal resection, fistula repair  . Brain tumor   . Migraines   . Hypercholesteremia   . Hypertension   . GERD (gastroesophageal reflux disease)   . Anxiety   . Depression   . Emphysema   . Macular degeneration   . Cataract   . Arthritis   . Pernicious anemia   . Primary adrenal deficiency   . COPD (chronic obstructive pulmonary disease)   . Neuropathy, idiopathic   . Anticoagulated on warfarin    Hx of DVT,    Past Surgical History  Procedure Date  . Cholecystectomy   . Cataract repair   . Ileal resection   . Hemicolectomy     rt due to crohns  . Colonoscopy 08/17/2011    Procedure: COLONOSCOPY;  Surgeon: Freddy Jaksch, MD;  Location: [facility];  Service: Endoscopy;  Laterality: N/A;  . Givens capsule study 09/27/2011    Procedure: GIVENS CAPSULE STUDY;  Surgeon: Freddy Jaksch, MD;  Location: [facility];  Service: Endoscopy;  Laterality: N/A;    Family History  Problem Relation Age of Onset  . Anesthesia problems Neg Hx   . Hypotension Neg Hx   . Malignant hyperthermia Neg Hx   . Pseudochol deficiency Neg Hx     History  Substance Use Topics  . Smoking status: Current Everyday Smoker -- 1.0 packs/day    Types: Cigarettes  . Smokeless tobacco: Not on file  . Alcohol Use: No    OB History    Grav Para Term Preterm Abortions TAB SAB Ect Mult Living                  Review of Systems  All other systems reviewed and are negative.    Allergies  Celebrex; Flagyl; Smz-tmp ds; Ceclor; Ciprofloxacin hcl; Diclofenac sodium; Doxycycline; Hydrocodone; Iohexol; Prilosec; Tylenol; and Penicillins  Home Medications  Current Outpatient Rx  Name Route Sig Dispense Refill  . ALBUTEROL SULFATE HFA 108 (90 BASE) MCG/ACT IN AERS Inhalation Inhale 2 puffs into the lungs every 6 (six) hours as needed. For shortness of breath.    . ALPRAZOLAM 0.25 MG PO TABS Oral Take 0.25 mg by mouth 3 (three) times daily as needed. For anxiety.    . AMLODIPINE BESYLATE 2.5 MG PO TABS Oral Take 2.5 mg by mouth daily.      Marland Kitchen CALCITONIN (SALMON) 200 UNIT/ACT NA SOLN Nasal Place 1 spray into the nose every other day.      Marland Kitchen CALCIUM CARBONATE 600 MG PO TABS Oral Take 600 mg by mouth 2 (two) times daily with a meal.      . CYANOCOBALAMIN 1000 MCG/ML IJ SOLN Intramuscular Inject 1,000 mcg into the muscle every 30 (thirty) days.    . DULOXETINE HCL 60 MG PO CPEP Oral Take 60 mg by mouth  daily.      . FENTANYL 75 MCG/HR TD PT72 Transdermal Place 1 patch onto the skin every other day.      Marland Kitchen GABAPENTIN (PHN) 300 MG PO TABS Oral Take 1 tablet by mouth daily.      Marland Kitchen MAGNESIUM OXIDE 400 MG PO TABS Oral Take 800 mg by mouth daily.    . MOMETASONE FURO-FORMOTEROL FUM 200-5 MCG/ACT IN AERO Inhalation Inhale 2 puffs into the lungs 2 (two) times daily.    Marland Kitchen NADOLOL 20 MG PO TABS Oral Take 20 mg by mouth daily.      Marland Kitchen POTASSIUM CHLORIDE 10 MEQ PO TBCR Oral Take 5-10 mEq by mouth daily. Takes 1 tablet daily Monday through Friday for dosage and takes 1/2 tablet Saturday and Sunday for dosage    . PREDNISONE 5 MG PO TABS Oral Take 5 mg by mouth daily.     Marland Kitchen ALIGN 4 MG PO CAPS Oral Take 1 capsule by mouth daily.    Marland Kitchen RANITIDINE HCL 300 MG PO CAPS Oral Take 300 mg by mouth 2 (two) times daily.      Marland Kitchen SACCHAROMYCES BOULARDII 250 MG PO CAPS Oral Take 250 mg by mouth 2 (two) times daily.    Marland Kitchen SIMVASTATIN 80 MG PO TABS Oral Take 1 tablet (80 mg total) by mouth daily. 30 tablet 0  . SUCRALFATE 1 G PO TABS Oral Take 1 g by mouth 4 (four) times daily.    . TRAMADOL HCL 50 MG PO TABS Oral Take 100 mg by mouth every 8 (eight) hours as needed. Pain.Marland KitchenMarland KitchenMaximum dose= 8 tablets per day     . VITAMIN D (ERGOCALCIFEROL) 50000 UNITS PO CAPS Oral Take 50,000 Units by mouth every 7 (seven) days. Taken on Sundays.    Marland Kitchen ZOLPIDEM TARTRATE 5 MG PO TABS Oral Take 1 tablet (5 mg total) by mouth at bedtime as needed for sleep (insomnia). 30 tablet 0  . OXYCODONE-ACETAMINOPHEN 5-325 MG PO TABS Oral Take 1 tablet by mouth every 4 (four) hours as needed for pain. 20 tablet 0  . POLYETHYLENE GLYCOL 3350 PO PACK Oral Take 17 g by mouth daily. 14 each 0    There were no vitals taken for this visit.  Physical Exam  Nursing note and vitals reviewed. Constitutional: She is oriented to person, place, and time. She appears well-developed and well-nourished. No distress.  HENT:  Head: Normocephalic and atraumatic.    Eyes: EOM are normal.  Neck: Normal range of motion.  Cardiovascular: Normal rate, regular rhythm and  normal heart sounds.   Pulmonary/Chest: Effort normal and breath sounds normal.  Abdominal: Soft. She exhibits no distension. There is no tenderness.  Musculoskeletal: Normal range of motion.       No L spine tenderness. Normal strength in major muscle groups of bilateral lower extremities  Neurological: She is alert and oriented to person, place, and time.  Skin: Skin is warm and dry.  Psychiatric: She has a normal mood and affect. Judgment normal.    ED Course  Procedures (including critical care time)  Labs Reviewed - No data to display No results found.   1. Sciatica, left       MDM  The patient's symptoms are consistent with sciatica.  She did have an L45 herniated disc noted on CT of her abdomen pelvis from 12/12/2011.  [name] not look distended on exam.  [name] been having normal bowel movements.  I agree that narcotic medications can slow her bowel to put however I think that treating her pain is likely more important.  The patient will be placed on when necessary MiraLAX to help keep her stools loose while taking narcotic medications for her pain.  [name] been instructed to not take her Percocet along with tramadol.        Lyanne Co, MD 12/17/11 [phone]

## 2011-12-17 NOTE — ED Notes (Signed)
Patient reports that she has had sciatica pain on the right and now feels like she has the same on the left. Patient states the pain was worse today.

## 2012-01-28 ENCOUNTER — Encounter (HOSPITAL_COMMUNITY): Payer: Self-pay | Admitting: Family Medicine

## 2012-01-28 ENCOUNTER — Inpatient Hospital Stay (HOSPITAL_COMMUNITY): Payer: Medicare Other

## 2012-01-28 ENCOUNTER — Inpatient Hospital Stay (HOSPITAL_COMMUNITY)
Admission: EM | Admit: 2012-01-28 | Discharge: 2012-01-31 | DRG: 812 | Disposition: A | Discharge status: Home / Self Care | Payer: Medicare Other | Attending: Internal Medicine | Admitting: Internal Medicine

## 2012-01-28 ENCOUNTER — Emergency Department (HOSPITAL_COMMUNITY): Payer: Medicare Other

## 2012-01-28 DIAGNOSIS — R0600 Dyspnea, unspecified: Secondary | ICD-10-CM | POA: Diagnosis present

## 2012-01-28 DIAGNOSIS — Z86718 Personal history of other venous thrombosis and embolism: Secondary | ICD-10-CM

## 2012-01-28 DIAGNOSIS — I2699 Other pulmonary embolism without acute cor pulmonale: Secondary | ICD-10-CM | POA: Diagnosis present

## 2012-01-28 DIAGNOSIS — Z79899 Other long term (current) drug therapy: Secondary | ICD-10-CM

## 2012-01-28 DIAGNOSIS — Z8612 Personal history of poliomyelitis: Secondary | ICD-10-CM

## 2012-01-28 DIAGNOSIS — F3289 Other specified depressive episodes: Secondary | ICD-10-CM | POA: Diagnosis present

## 2012-01-28 DIAGNOSIS — D649 Anemia, unspecified: Secondary | ICD-10-CM

## 2012-01-28 DIAGNOSIS — E538 Deficiency of other specified B group vitamins: Secondary | ICD-10-CM | POA: Diagnosis present

## 2012-01-28 DIAGNOSIS — M810 Age-related osteoporosis without current pathological fracture: Secondary | ICD-10-CM | POA: Diagnosis present

## 2012-01-28 DIAGNOSIS — F329 Major depressive disorder, single episode, unspecified: Secondary | ICD-10-CM | POA: Diagnosis present

## 2012-01-28 DIAGNOSIS — Z86711 Personal history of pulmonary embolism: Secondary | ICD-10-CM

## 2012-01-28 DIAGNOSIS — J4489 Other specified chronic obstructive pulmonary disease: Secondary | ICD-10-CM | POA: Diagnosis present

## 2012-01-28 DIAGNOSIS — G609 Hereditary and idiopathic neuropathy, unspecified: Secondary | ICD-10-CM | POA: Diagnosis present

## 2012-01-28 DIAGNOSIS — R0989 Other specified symptoms and signs involving the circulatory and respiratory systems: Secondary | ICD-10-CM | POA: Diagnosis present

## 2012-01-28 DIAGNOSIS — E7800 Pure hypercholesterolemia, unspecified: Secondary | ICD-10-CM | POA: Diagnosis present

## 2012-01-28 DIAGNOSIS — K219 Gastro-esophageal reflux disease without esophagitis: Secondary | ICD-10-CM | POA: Diagnosis present

## 2012-01-28 DIAGNOSIS — R0902 Hypoxemia: Secondary | ICD-10-CM | POA: Diagnosis present

## 2012-01-28 DIAGNOSIS — F411 Generalized anxiety disorder: Secondary | ICD-10-CM | POA: Diagnosis present

## 2012-01-28 DIAGNOSIS — D509 Iron deficiency anemia, unspecified: Principal | ICD-10-CM | POA: Diagnosis present

## 2012-01-28 DIAGNOSIS — E2749 Other adrenocortical insufficiency: Secondary | ICD-10-CM | POA: Diagnosis present

## 2012-01-28 DIAGNOSIS — I10 Essential (primary) hypertension: Secondary | ICD-10-CM | POA: Diagnosis present

## 2012-01-28 DIAGNOSIS — E2740 Unspecified adrenocortical insufficiency: Secondary | ICD-10-CM | POA: Diagnosis present

## 2012-01-28 DIAGNOSIS — IMO0002 Reserved for concepts with insufficient information to code with codable children: Secondary | ICD-10-CM

## 2012-01-28 DIAGNOSIS — R109 Unspecified abdominal pain: Secondary | ICD-10-CM | POA: Diagnosis present

## 2012-01-28 DIAGNOSIS — J449 Chronic obstructive pulmonary disease, unspecified: Secondary | ICD-10-CM | POA: Diagnosis present

## 2012-01-28 DIAGNOSIS — R0609 Other forms of dyspnea: Secondary | ICD-10-CM | POA: Diagnosis present

## 2012-01-28 DIAGNOSIS — K5090 Crohn's disease, unspecified, without complications: Secondary | ICD-10-CM | POA: Diagnosis present

## 2012-01-28 DIAGNOSIS — R0602 Shortness of breath: Secondary | ICD-10-CM

## 2012-01-28 LAB — IRON AND TIBC: Iron: <10 ug/dL — ABNORMAL LOW (ref 42–135)

## 2012-01-28 LAB — DIFFERENTIAL
Eosinophils Absolute: 0 10*3/uL (ref 0.0–0.7)
Lymphocytes Relative: 18 % (ref 12–46)
Lymphs Abs: 0.6 10*3/uL — ABNORMAL LOW (ref 0.7–4.0)
Monocytes Relative: 2 % — ABNORMAL LOW (ref 3–12)
Neutro Abs: 2.7 10*3/uL (ref 1.7–7.7)
Neutrophils Relative %: 80 % — ABNORMAL HIGH (ref 43–77)

## 2012-01-28 LAB — CBC
HCT: 29 % — ABNORMAL LOW (ref 36.0–46.0)
Hemoglobin: 7.9 g/dL — ABNORMAL LOW (ref 12.0–15.0)
Hemoglobin: 10.8 g/dL — ABNORMAL LOW (ref 12.0–15.0)
MCH: 23 pg — ABNORMAL LOW (ref 26.0–34.0)
Platelets: 439 10*3/uL — ABNORMAL HIGH (ref 150–400)
RBC: 3.87 MIL/uL (ref 3.87–5.11)
RBC: 4.7 MIL/uL (ref 3.87–5.11)
WBC: 6.4 10*3/uL (ref 4.0–10.5)
WBC: 3.3 10*3/uL — ABNORMAL LOW (ref 4.0–10.5)

## 2012-01-28 LAB — PROTIME-INR: INR: 1.69 — ABNORMAL HIGH (ref 0.00–1.49)

## 2012-01-28 LAB — BASIC METABOLIC PANEL
BUN: 8 mg/dL (ref 6–23)
CO2: 31 mEq/L (ref 19–32)
Chloride: 97 mEq/L (ref 96–112)
Glucose, Bld: 97 mg/dL (ref 70–99)
Potassium: 3.8 mEq/L (ref 3.5–5.1)

## 2012-01-28 LAB — PREPARE RBC (CROSSMATCH)

## 2012-01-28 LAB — RETICULOCYTES: Retic Ct Pct: 3.2 % — ABNORMAL HIGH (ref 0.4–3.1)

## 2012-01-28 LAB — VITAMIN B12: Vitamin B-12: 259 pg/mL (ref 211–911)

## 2012-01-28 LAB — POCT I-STAT TROPONIN I: Troponin i, poc: 0 ng/mL (ref 0.00–0.08)

## 2012-01-28 MED ORDER — SODIUM CHLORIDE 0.9 % IJ SOLN
3.0000 mL | Freq: Two times a day (BID) | INTRAMUSCULAR | Status: DC
Start: 2012-01-28 — End: 2012-01-31
  Administered 2012-01-28 – 2012-01-31 (×4): 3 mL via INTRAVENOUS

## 2012-01-28 MED ORDER — MOMETASONE FURO-FORMOTEROL FUM 200-5 MCG/ACT IN AERO
2.0000 | INHALATION_SPRAY | Freq: Two times a day (BID) | RESPIRATORY_TRACT | Status: DC
  Administered 2012-01-28 – 2012-01-29 (×2): 2 via RESPIRATORY_TRACT
  Filled 2012-01-28 (×17): qty 0.3

## 2012-01-28 MED ORDER — FENTANYL 75 MCG/HR TD PT72
75.0000 ug | MEDICATED_PATCH | TRANSDERMAL | Status: DC
  Administered 2012-01-29 – 2012-01-31 (×2): 75 ug via TRANSDERMAL
  Filled 2012-01-28 (×2): qty 1

## 2012-01-28 MED ORDER — HYDROMORPHONE HCL PF 1 MG/ML IJ SOLN
0.5000 mg | INTRAMUSCULAR | Status: DC | PRN
  Administered 2012-01-28: 22:00:00 via INTRAVENOUS
  Administered 2012-01-29 – 2012-01-31 (×6): 0.5 mg via INTRAVENOUS
  Filled 2012-01-28 (×9): qty 1

## 2012-01-28 MED ORDER — FAMOTIDINE 20 MG PO TABS
20.0000 mg | ORAL_TABLET | Freq: Two times a day (BID) | ORAL | Status: DC
  Administered 2012-01-28 – 2012-01-31 (×6): 20 mg via ORAL
  Filled 2012-01-28 (×8): qty 1

## 2012-01-28 MED ORDER — CALCIUM CARBONATE 600 MG PO TABS: 600.0000 mg | ORAL_TABLET | Freq: Two times a day (BID) | ORAL | Status: DC

## 2012-01-28 MED ORDER — PREDNISONE 5 MG PO TABS
5.0000 mg | ORAL_TABLET | Freq: Every day | ORAL | Status: DC
  Administered 2012-01-29 – 2012-01-31 (×3): 5 mg via ORAL
  Filled 2012-01-28 (×4): qty 1

## 2012-01-28 MED ORDER — VITAMIN D (ERGOCALCIFEROL) 1.25 MG (50000 UNIT) PO CAPS: 50000.0000 [IU] | ORAL_CAPSULE | ORAL | Status: DC

## 2012-01-28 MED ORDER — TRAMADOL HCL 50 MG PO TABS
100.0000 mg | ORAL_TABLET | Freq: Three times a day (TID) | ORAL | Status: DC | PRN
  Administered 2012-01-28 – 2012-01-30 (×3): 100 mg via ORAL
  Filled 2012-01-28: qty 2
  Filled 2012-01-28 (×2): qty 1

## 2012-01-28 MED ORDER — DULOXETINE HCL 60 MG PO CPEP
60.0000 mg | ORAL_CAPSULE | Freq: Every day | ORAL | Status: DC
  Administered 2012-01-29 – 2012-01-31 (×3): 60 mg via ORAL
  Filled 2012-01-28 (×3): qty 1

## 2012-01-28 MED ORDER — PREDNISONE 20 MG PO TABS
60.0000 mg | ORAL_TABLET | Freq: Once | ORAL | Status: AC
  Administered 2012-01-28: 60 mg via ORAL
  Filled 2012-01-28: qty 3

## 2012-01-28 MED ORDER — IPRATROPIUM BROMIDE 0.02 % IN SOLN
0.5000 mg | Freq: Once | RESPIRATORY_TRACT | Status: AC
  Administered 2012-01-28: 0.5 mg via RESPIRATORY_TRACT
  Filled 2012-01-28: qty 2.5

## 2012-01-28 MED ORDER — ALBUTEROL SULFATE (5 MG/ML) 0.5% IN NEBU
5.0000 mg | INHALATION_SOLUTION | Freq: Once | RESPIRATORY_TRACT | Status: AC
  Administered 2012-01-28: 5 mg via RESPIRATORY_TRACT
  Filled 2012-01-28: qty 1

## 2012-01-28 MED ORDER — TRAMADOL HCL 50 MG PO TABS
ORAL_TABLET | ORAL | Status: AC
  Filled 2012-01-28: qty 2

## 2012-01-28 MED ORDER — GABAPENTIN 300 MG PO CAPS
300.0000 mg | ORAL_CAPSULE | Freq: Every day | ORAL | Status: DC
Start: 2012-01-29 — End: 2012-01-31
  Administered 2012-01-29 – 2012-01-31 (×3): 300 mg via ORAL
  Filled 2012-01-28 (×3): qty 1

## 2012-01-28 MED ORDER — RIVAROXABAN 20 MG PO TABS: 1.0000 | ORAL_TABLET | Freq: Every evening | ORAL | Status: DC

## 2012-01-28 MED ORDER — ALPRAZOLAM 0.25 MG PO TABS
0.2500 mg | ORAL_TABLET | Freq: Three times a day (TID) | ORAL | Status: DC | PRN
  Administered 2012-01-28 – 2012-01-30 (×4): 0.25 mg via ORAL
  Filled 2012-01-28 (×5): qty 1

## 2012-01-28 MED ORDER — CALCIUM CARBONATE 1250 (500 CA) MG PO TABS
1.0000 | ORAL_TABLET | Freq: Two times a day (BID) | ORAL | Status: DC
  Administered 2012-01-29 – 2012-01-31 (×5): 500 mg via ORAL
  Filled 2012-01-28 (×7): qty 1

## 2012-01-28 MED ORDER — ATORVASTATIN CALCIUM 40 MG PO TABS
40.0000 mg | ORAL_TABLET | Freq: Every day | ORAL | Status: DC
  Administered 2012-01-29 – 2012-01-30 (×2): 40 mg via ORAL
  Filled 2012-01-28 (×4): qty 1

## 2012-01-28 MED ORDER — NADOLOL 20 MG PO TABS
20.0000 mg | ORAL_TABLET | Freq: Every day | ORAL | Status: DC
  Administered 2012-01-29 – 2012-01-31 (×3): 20 mg via ORAL
  Filled 2012-01-28 (×3): qty 1

## 2012-01-28 MED ORDER — SUCRALFATE 1 G PO TABS
1.0000 g | ORAL_TABLET | Freq: Three times a day (TID) | ORAL | Status: DC
  Administered 2012-01-28 – 2012-01-31 (×9): 1 g via ORAL
  Filled 2012-01-28 (×15): qty 1

## 2012-01-28 MED ORDER — RIVAROXABAN 10 MG PO TABS
20.0000 mg | ORAL_TABLET | Freq: Every evening | ORAL | Status: DC
  Administered 2012-01-29 – 2012-01-30 (×2): 20 mg via ORAL
  Filled 2012-01-28 (×4): qty 2

## 2012-01-28 MED ORDER — AMLODIPINE BESYLATE 2.5 MG PO TABS
2.5000 mg | ORAL_TABLET | Freq: Every day | ORAL | Status: DC
  Administered 2012-01-28 – 2012-01-30 (×3): 2.5 mg via ORAL
  Filled 2012-01-28 (×5): qty 1

## 2012-01-28 NOTE — ED Notes (Signed)
Attempt x1 to call report to floor. States RN will call back.

## 2012-01-28 NOTE — ED Provider Notes (Signed)
Medical screening examination/treatment/procedure(s) were conducted as a shared visit with non-physician practitioner(s) and myself.  I personally evaluated the patient during the encounter Hx of copd and anemia.  Does not use oxygen at home. No smoking since [employer] Patrick's day.   No cp.  No cough, fever, leg pain or swelling.  No travel or surgery recently.  Does have hx of pe.  Pe. Lungs clear. Pale.  Doubt acs, chf, pe.   Will check labs and tx symptoms.   + anemia increased. Will transfuse and admit.   CRITICAL CARE Performed by: Nicholes Stairs   Total critical care time: 30 min  Critical care time was exclusive of separately billable procedures and treating other patients.  Critical care was necessary to treat or prevent imminent or life-threatening deterioration.  Critical care was time spent personally by me on the following activities: development of treatment plan with patient and/or surrogate as well as nursing, discussions with consultants, evaluation of patient's response to treatment, examination of patient, obtaining history from patient or surrogate, ordering and performing treatments and interventions, ordering and review of laboratory studies, ordering and review of radiographic studies, pulse oximetry and re-evaluation of patient's condition.  Cheri Guppy, MD 01/28/12 1549

## 2012-01-28 NOTE — ED Provider Notes (Signed)
History     CSN: [id number]  Arrival date & time 01/28/12  1191   First MD Initiated Contact with Patient 01/28/12 1004      Chief Complaint  Patient presents with  . Shortness of Breath  . Chest Pain    (Consider location/radiation/quality/duration/timing/severity/associated sxs/prior treatment) HPI Comments: Patient with a history of COPD and PE comes in today with a chief complaint of SOB.  She reports that she began feeling SOB two weeks ago and it has gradually been worsening.  She denies any wheezing.  She also reports that she is concerned that she may have broken a rib.  She began having pain of her lower rib cage bilaterally after coughing 1.5 weeks ago and has been having pain ever since that time. No Chest Pain at this time.  No cough at this time.  She currently does not smoke.  She reports that she quit smoking on 10/28/11.  She is currently on xeralto for her history of PE.  She is taking the medication as directed.  She recently had a hemoglobin of 8.9 on 01/24/12.  She is followed by GI for a recent GI bleed.  She has had a Colonoscopy, Endoscopy, and a Capsule Endoscopy done in February 2013, but no identifiable cause of GI bleeding has been identified. She reports that she has had melena for the past couple of weeks, but no hematochezia.  Her PCP is Dr. Laurene Footman with [facility].  The history is provided by the patient.    Past Medical History  Diagnosis Date  . Polio   . Osteoporosis     multiple compression fractures  . Crohn's disease     since 1966 on chronic steroids s/p ileal resection, fistula repair  . Brain tumor   . Migraines   . Hypercholesteremia   . Hypertension   . GERD (gastroesophageal reflux disease)   . Anxiety   . Depression   . Emphysema   . Macular degeneration   . Cataract   . Arthritis   . Pernicious anemia   . Primary adrenal deficiency   . COPD (chronic obstructive pulmonary disease)   . Neuropathy, idiopathic   .  Anticoagulated on warfarin     Hx of DVT,    Past Surgical History  Procedure Date  . Cholecystectomy   . Cataract repair   . Ileal resection   . Hemicolectomy     rt due to crohns  . Colonoscopy 08/17/2011    Procedure: COLONOSCOPY;  Surgeon: Freddy Jaksch, MD;  Location: [facility];  Service: Endoscopy;  Laterality: N/A;  . Givens capsule study 09/27/2011    Procedure: GIVENS CAPSULE STUDY;  Surgeon: Freddy Jaksch, MD;  Location: [facility];  Service: Endoscopy;  Laterality: N/A;    Family History  Problem Relation Age of Onset  . Anesthesia problems Neg Hx   . Hypotension Neg Hx   . Malignant hyperthermia Neg Hx   . Pseudochol deficiency Neg Hx     History  Substance Use Topics  . Smoking status: Former Smoker -- 1.0 packs/day    Types: Cigarettes    Quit date: 10/28/2011  . Smokeless tobacco: Not on file  . Alcohol Use: No    OB History    Grav Para Term Preterm Abortions TAB SAB Ect Mult Living                  Review of Systems  Constitutional: Positive for fatigue. Negative for fever, chills and  diaphoresis.  HENT: Negative for neck pain and neck stiffness.   Respiratory: Positive for shortness of breath. Negative for cough, chest tightness and wheezing.   Cardiovascular: Negative for chest pain, palpitations and leg swelling.  Gastrointestinal: Negative for nausea, vomiting, abdominal pain and diarrhea.  Genitourinary: Negative for dysuria.  Neurological: Negative for dizziness, syncope and light-headedness.    Allergies  Celebrex; Flagyl; Smz-tmp ds; Ceclor; Ciprofloxacin hcl; Diclofenac sodium; Doxycycline; Hydrocodone; Iohexol; Prilosec; Tylenol; and Penicillins  Home Medications   Current Outpatient Rx  Name Route Sig Dispense Refill  . ALBUTEROL SULFATE HFA 108 (90 BASE) MCG/ACT IN AERS Inhalation Inhale 2 puffs into the lungs every 6 (six) hours as needed. For shortness of breath.    . ALPRAZOLAM 0.25 MG PO TABS Oral Take 0.25 mg by mouth  3 (three) times daily as needed. For anxiety.    . AMLODIPINE BESYLATE 2.5 MG PO TABS Oral Take 2.5 mg by mouth daily.      Marland Kitchen CALCITONIN (SALMON) 200 UNIT/ACT NA SOLN Nasal Place 1 spray into the nose every other day.      Marland Kitchen CALCIUM CARBONATE 600 MG PO TABS Oral Take 600 mg by mouth 2 (two) times daily with a meal.      . CYANOCOBALAMIN 1000 MCG/ML IJ SOLN Intramuscular Inject 1,000 mcg into the muscle every 30 (thirty) days.    . DULOXETINE HCL 60 MG PO CPEP Oral Take 60 mg by mouth daily.      . FENTANYL 75 MCG/HR TD PT72 Transdermal Place 1 patch onto the skin every other day.      Marland Kitchen GABAPENTIN (PHN) 300 MG PO TABS Oral Take 1 tablet by mouth daily.      Marland Kitchen MAGNESIUM OXIDE 400 MG PO TABS Oral Take 800 mg by mouth daily.    . MOMETASONE FURO-FORMOTEROL FUM 200-5 MCG/ACT IN AERO Inhalation Inhale 2 puffs into the lungs 2 (two) times daily.    Marland Kitchen NADOLOL 20 MG PO TABS Oral Take 20 mg by mouth daily.      Marland Kitchen POTASSIUM CHLORIDE 10 MEQ PO TBCR Oral Take 5-10 mEq by mouth daily. Takes 1 tablet daily Monday through Friday for dosage and takes 1/2 tablet Saturday and Sunday for dosage    . PREDNISONE 5 MG PO TABS Oral Take 5 mg by mouth daily.     Marland Kitchen ALIGN 4 MG PO CAPS Oral Take 1 capsule by mouth daily.    Marland Kitchen RANITIDINE HCL 300 MG PO CAPS Oral Take 300 mg by mouth 2 (two) times daily.      Marland Kitchen SACCHAROMYCES BOULARDII 250 MG PO CAPS Oral Take 250 mg by mouth 2 (two) times daily.    Marland Kitchen SIMVASTATIN 80 MG PO TABS Oral Take 1 tablet (80 mg total) by mouth daily. 30 tablet 0  . SUCRALFATE 1 G PO TABS Oral Take 1 g by mouth 4 (four) times daily.    . TRAMADOL HCL 50 MG PO TABS Oral Take 100 mg by mouth every 8 (eight) hours as needed. Pain.Marland KitchenMarland KitchenMaximum dose= 8 tablets per day     . VITAMIN D (ERGOCALCIFEROL) 50000 UNITS PO CAPS Oral Take 50,000 Units by mouth every 7 (seven) days. Taken on Sundays.    Marland Kitchen ZOLPIDEM TARTRATE 5 MG PO TABS Oral Take 1 tablet (5 mg total) by mouth at bedtime as needed for sleep  (insomnia). 30 tablet 0    BP 142/71  Pulse 70  Temp 98.2 F (36.8 C) (Oral)  Resp 22  SpO2 99%  Physical Exam  Nursing note and vitals reviewed. Constitutional: She appears well-developed and well-nourished. No distress.  HENT:  Head: Normocephalic and atraumatic.  Mouth/Throat: Oropharynx is clear and moist.  Neck: Normal range of motion. Neck supple.  Cardiovascular: Normal rate, regular rhythm and normal heart sounds.        No lower extremity edema or erythema  Pulmonary/Chest: Effort normal and breath sounds normal. No respiratory distress. She has no wheezes. She has no rales. She exhibits tenderness.  Abdominal: Soft. Bowel sounds are normal. There is no tenderness.  Musculoskeletal: Normal range of motion.  Neurological: She is alert.  Skin: Skin is warm and dry. She is not diaphoretic. No erythema.  Psychiatric: She has a normal mood and affect.    ED Course  Procedures (including critical care time)   Labs Reviewed  CBC  BASIC METABOLIC PANEL  PRO B NATRIURETIC PEPTIDE  PROTIME-INR   Chest Portable 1 View  01/28/2012  *RADIOLOGY REPORT*  Clinical Data: Shortness of breath, history hypertension and smoking, recent pneumonia 1 month ago  PORTABLE CHEST - 1 VIEW  Comparison: Portable exam 1023 hours compared to 05/17/2008  Findings: Upper normal heart size. Calcified tortuous aorta. Mediastinal contours stable. Diffuse bilateral interstitial infiltrates question edema versus infection. Underlying emphysematous and bronchitic changes. Small left pleural effusion. No pneumothorax. Osseous demineralization with multiple old right rib fractures.  IMPRESSION: Changes of COPD with new diffuse interstitial infiltrates question edema versus infection.  Original Report Authenticated By: Lollie Marrow, M.D.     No diagnosis found.   Date: 01/28/2012  Rate: 69  Rhythm: normal sinus rhythm  QRS Axis: normal  Intervals: normal  ST/T Wave abnormalities: normal  Conduction  Disutrbances:none  Narrative Interpretation:   Old EKG Reviewed: unchanged    MDM  Patient presents with history of COPD and PE comes in with a chief complaint of SOB that has been gradually worsening over the past 2 weeks.  Lungs CTAB.   Patient found to be hypoxic in the ED.  However, no chest pain and patient is currently on xarelto.  Therefore, doubt PE.  CXR shows possible Pneumonia.  However, patient afebrile with no cough.  Therefore, the patient was not started on antibiotics in the ED.  No acute abnormalities on EKG and no CP.  Therefore, feel that ACS is unlikely.  Hemoglobin found to be 7.9 while in ED, which could be contributing to patient's shortness of breath.  Two units PRBC ordered and patient admitted to the hospital.          Pascal Lux Eloy, PA-C 01/28/12 2249

## 2012-01-28 NOTE — ED Notes (Signed)
Admitting physician at bedside

## 2012-01-28 NOTE — ED Notes (Signed)
Blood verified by Silvio Pate, RN

## 2012-01-28 NOTE — H&P (Signed)
PCP:   Julian Hy, MD   Chief Complaint:  Shortness of breath  HPI: Ms. 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  Patient states that she has been chronically ill over the past several months.  She has been undergoing an evaluation for her chronic anemia which has included an EGD, colonoscopy, capsule endoscopy which is been unrevealing.  Her most recent hemoglobin was 8.5 at Dr. Donavan Burnet his office about 2 weeks ago. At that time she was also, have a platelet count of 99.  She is also been treated recently for possible pneumonia with Avelox based on the CT report (on 5/21) showing bilateral interstitial process.  Over the past several weeks she's become more weak.  This weekend she noticed a significant increase in fatigue and shortness of breath.  Concurrently, she developed right lower rib pain which is worse with a deep breath.  She also noticed a change in bowel movements with 2-3 bowel movements per day.  She denies any melena or hematochezia.  Given the increased shortness of breath and right-sided chest pain she presented to the emergency department where her oxygen saturations are 89% on room air, hemoglobin is 7.9 and chest x-ray shows persistent bilateral interstitial process.  We were called to admit the patient.   Review of Systems:  Review of Systems - all systems reviewed with patient and are negative except history of present illness.  Past Medical History: Past Medical History  Diagnosis Date  . Polio   . Osteoporosis     multiple compression fractures  . Crohn's disease     since 1966 on chronic steroids s/p ileal resection, fistula repair  . Brain tumor   . Migraines   . Hypercholesteremia   . Hypertension   . GERD (gastroesophageal reflux disease)   . Anxiety   . Depression   . Emphysema   .  Macular degeneration   . Cataract   . Arthritis   . Pernicious anemia   . Primary adrenal deficiency   . COPD (chronic obstructive pulmonary disease)   . Neuropathy, idiopathic   . Anticoagulated on warfarin/Xeralto due to Recurrent PE/DVT    Past Surgical History  Procedure Date  . Cholecystectomy   . Cataract repair   . Ileal resection   . Hemicolectomy     rt due to crohns  . Colonoscopy 08/17/2011    Procedure: COLONOSCOPY;  Surgeon: Freddy Jaksch, MD;  Location: [facility];  Service: Endoscopy;  Laterality: N/A;  . Givens capsule study 09/27/2011    Procedure: GIVENS CAPSULE STUDY;  Surgeon: Freddy Jaksch, MD;  Location: [facility];  Service: Endoscopy;  Laterality: N/A;    Medications: Prior to Admission medications   Medication Sig Start Date End Date Taking? Authorizing Provider  ALPRAZolam (XANAX) 0.25 MG tablet Take 0.25 mg by mouth 3 (three) times daily as needed. For anxiety.   Yes Historical Provider, MD  amLODipine (NORVASC) 2.5 MG tablet Take 2.5 mg by mouth at bedtime.    Yes Historical Provider, MD  calcium carbonate (OS-CAL) 600 MG TABS Take 600 mg by mouth 2 (two) times daily with a meal.     Yes Historical Provider, MD  DULoxetine (CYMBALTA) 60 MG capsule Take 60 mg by mouth daily.     Yes Historical Provider, MD  fentaNYL (DURAGESIC - DOSED MCG/HR) 75 MCG/HR Place 1  patch onto the skin every other day.     Yes Historical Provider, MD  Gabapentin, PHN, (GRALISE) 300 MG TABS Take 1 tablet by mouth daily.     Yes Historical Provider, MD  Mometasone Furo-Formoterol Fum (DULERA) 200-5 MCG/ACT AERO Inhale 2 puffs into the lungs 2 (two) times daily.   Yes Historical Provider, MD  nadolol (CORGARD) 20 MG tablet Take 20 mg by mouth daily.     Yes Historical Provider, MD  potassium chloride (KLOR-CON) 10 MEQ CR tablet Take 5-10 mEq by mouth daily. Takes 1 tablet daily Monday through Friday for dosage and takes 1/2 tablet Saturday and Sunday for dosage    Yes Historical Provider, MD  predniSONE (DELTASONE) 5 MG tablet Take 5 mg by mouth daily.    Yes Historical Provider, MD  Probiotic Product (ALIGN) 4 MG CAPS Take 1 capsule by mouth daily.   Yes Historical Provider, MD  ranitidine (ZANTAC) 300 MG capsule Take 300 mg by mouth 2 (two) times daily.     Yes Historical Provider, MD  Rivaroxaban 20 MG TABS Take 1 tablet by mouth every evening.   Yes Historical Provider, MD  saccharomyces boulardii (FLORASTOR) 250 MG capsule Take 250 mg by mouth 2 (two) times daily.   Yes Historical Provider, MD  simvastatin (ZOCOR) 80 MG tablet Take 1 tablet (80 mg total) by mouth daily. 09/28/11  Yes Julian Hy, MD  sucralfate (CARAFATE) 1 G tablet Take 1 g by mouth 4 (four) times daily.   Yes Historical Provider, MD  traMADol (ULTRAM) 50 MG tablet Take 100 mg by mouth every 8 (eight) hours as needed. Pain.Marland KitchenMarland KitchenMaximum dose= 8 tablets per day    Yes Historical Provider, MD  Vitamin D, Ergocalciferol, (DRISDOL) 50000 UNITS CAPS Take 50,000 Units by mouth every 7 (seven) days. Taken on Sundays.   Yes Historical Provider, MD    Allergies:   Allergies  Allergen Reactions  . Celebrex (Celecoxib) Shortness Of Breath    No voiding  . Flagyl (Metronidazole Hcl)     Vaginal rash  . Smz-Tmp Ds (Sulfamethoxazole W/Trimethoprim (Co-Trimoxazole)) Rash  . Ceclor (Cefaclor) Other (See Comments)    Stomach pain  . Ciprofloxacin Hcl     Possible GI Bleed.  . Diclofenac Sodium   . Doxycycline     Stomach pains.  . Hydrocodone     Stomach pain  . Iohexol      Code: HIVES, Desc: pt states she breaks out in hives and sneezes 10/01/08   Desc: STATES SHE ONLY REACTS BY SNEEZING   . Prilosec (Omeprazole) Other (See Comments)    Causes ulcers in stomach and esophagus  . Tylenol (Acetaminophen) Other (See Comments)    liver  . Penicillins Rash    Social History:  reports that she quit smoking about 3 months ago. Her smoking use included Cigarettes. She smoked 1 pack  per day. She has never used smokeless tobacco. She reports that she does not drink alcohol or use illicit drugs. Married. 1968 Smoker (1 ppd).  Quit 2013 Social drinker.  Family History: Family History (reviewed - no changes required): Mom died with CAD,  at 8 Dad killed in MVA at 36 Family History  Problem Relation Age of Onset  . Anesthesia problems Neg Hx   . Hypotension Neg Hx   . Malignant hyperthermia Neg Hx   . Pseudochol deficiency Neg Hx     Physical Exam: Filed Vitals:   01/28/12 0953 01/28/12 1100 01/28/12 1230 01/28/12 1320  BP:  149/69 133/65 118/49  Pulse:  69 70   Temp:    98.2 F (36.8 C)  TempSrc:    Oral  Resp:  21 17 16   SpO2: 99% 100% 98% 94%   General appearance: chronically ill, pale-appearing Head: Normocephalic, without obvious abnormality, atraumatic Eyes: conjunctivae/corneas clear. PERRL, EOM's intact.  Nose: Nares normal. Septum midline. Mucosa normal. No drainage or sinus tenderness. Throat: lips, mucosa, and tongue normal; teeth and gums normal Neck: no adenopathy, no carotid bruit, no JVD and thyroid not enlarged, symmetric, no tenderness/mass/nodules Resp: bilateral crackles in the bases about one third up Cardio: regular rate and rhythm, S1, S2 normal, no murmur, click, rub or gallop GI: soft, non-tender; bowel sounds normal; no masses,  no organomegaly Extremities: extremities normal, atraumatic, no cyanosis or edema; chronic scarring over the right anterior shin Pulses: 2+ and symmetric Lymph nodes: Cervical adenopathy: no cervical lymphadenopathy Neurologic: Alert and oriented X 3, normal strength and tone. Normal symmetric reflexes.     Labs on Admission:   Basename 01/28/12 1013  NA 136  K 3.8  CL 97  CO2 31  GLUCOSE 97  BUN 8  CREATININE 1.04  CALCIUM 8.6  MG --  PHOS --   No results found for this basename: AST:2,ALT:2,ALKPHOS:2,BILITOT:2,PROT:2,ALBUMIN:2 in the last 72 hours No results found for this basename:  LIPASE:2,AMYLASE:2 in the last 72 hours  Basename 01/28/12 1013  WBC 6.4  NEUTROABS --  HGB 7.9*  HCT 29.0*  MCV 74.9*  PLT 499*    Lab Results  Component Value Date   INR 1.69* 01/28/2012   INR 1.00 09/28/2011   INR 1.12 09/27/2011      Radiological Exams on Admission: Chest Portable 1 View  01/28/2012  *RADIOLOGY REPORT*  Clinical Data: Shortness of breath, history hypertension and smoking, recent pneumonia 1 month ago  PORTABLE CHEST - 1 VIEW  Comparison: Portable exam 1023 hours compared to 05/17/2008  Findings: Upper normal heart size. Calcified tortuous aorta. Mediastinal contours stable. Diffuse bilateral interstitial infiltrates question edema versus infection. Underlying emphysematous and bronchitic changes. Small left pleural effusion. No pneumothorax. Osseous demineralization with multiple old right rib fractures.  IMPRESSION: Changes of COPD with new diffuse interstitial infiltrates question edema versus infection.  Original Report Authenticated By: Lollie Marrow, M.D.   Orders placed during the hospital encounter of 01/28/12  . EKG 12-LEAD- normal    Assessment/Plan 1. Dyspnea with mild hypoxia on room air-this may be related to her anemia versus underlying chronic lung disease.  We'll obtain a CT of the chest to evaluate the interstitial findings on CT.  We'll also rule out pulmonary embolism though she has been compliant with her Xeralto.   2. Anemia-she has had an extensive GI evaluation which as been unrevealing.  No apparent GI blood loss.  We'll obtain an anemia panel, SPEP, UPEP. Heme check stools.  Transfuse 2 units packed red blood cells and check a posttransfusion hemoglobin.  Consider hematology evaluation to evaluate other potential causes of anemia given her negative GI workup. 3. History of Recurrent DVT/PE-we'll continue [place] unless there is evidence of acute GI blood loss. 4. Chronic adrenal insufficiency-continue prednisone.  No need for stress test  steroids at this point. 5. Hypertension-continue home medications. 6. Disposition-anticipate discharge in 2-3 days with close outpatient followup to continue the evaluation of her chronic medical issues.  Breckon Reeves,W DOUGLAS 01/28/2012, 1:38 PM

## 2012-01-29 ENCOUNTER — Inpatient Hospital Stay (HOSPITAL_COMMUNITY): Payer: Medicare Other

## 2012-01-29 ENCOUNTER — Encounter (HOSPITAL_COMMUNITY): Payer: Self-pay | Admitting: Radiology

## 2012-01-29 DIAGNOSIS — E538 Deficiency of other specified B group vitamins: Secondary | ICD-10-CM | POA: Diagnosis present

## 2012-01-29 DIAGNOSIS — R0600 Dyspnea, unspecified: Secondary | ICD-10-CM | POA: Diagnosis present

## 2012-01-29 DIAGNOSIS — D509 Iron deficiency anemia, unspecified: Secondary | ICD-10-CM | POA: Diagnosis present

## 2012-01-29 DIAGNOSIS — R0902 Hypoxemia: Secondary | ICD-10-CM | POA: Diagnosis present

## 2012-01-29 LAB — TYPE AND SCREEN
ABO/RH(D): A NEG
Unit division: 0

## 2012-01-29 LAB — CBC
HCT: 35.3 % — ABNORMAL LOW (ref 36.0–46.0)
MCH: 23.3 pg — ABNORMAL LOW (ref 26.0–34.0)
MCHC: 30.3 g/dL (ref 30.0–36.0)
RDW: 18.3 % — ABNORMAL HIGH (ref 11.5–15.5)

## 2012-01-29 LAB — BASIC METABOLIC PANEL
BUN: 15 mg/dL (ref 6–23)
Creatinine, Ser: 0.99 mg/dL (ref 0.50–1.10)
GFR calc Af Amer: 66 mL/min — ABNORMAL LOW (ref >90)
GFR calc non Af Amer: 57 mL/min — ABNORMAL LOW (ref >90)
Glucose, Bld: 89 mg/dL (ref 70–99)

## 2012-01-29 MED ORDER — SENNOSIDES-DOCUSATE SODIUM 8.6-50 MG PO TABS
2.0000 | ORAL_TABLET | Freq: Every day | ORAL | Status: DC
  Administered 2012-01-29 – 2012-01-30 (×2): 2 via ORAL
  Filled 2012-01-29 (×3): qty 2

## 2012-01-29 MED ORDER — MOMETASONE FURO-FORMOTEROL FUM 100-5 MCG/ACT IN AERO
2.0000 | INHALATION_SPRAY | Freq: Two times a day (BID) | RESPIRATORY_TRACT | Status: DC
  Administered 2012-01-30 (×2): 2 via RESPIRATORY_TRACT

## 2012-01-29 MED ORDER — FERROUS SULFATE 325 (65 FE) MG PO TABS
325.0000 mg | ORAL_TABLET | Freq: Three times a day (TID) | ORAL | Status: DC
  Administered 2012-01-29 – 2012-01-31 (×6): 325 mg via ORAL
  Filled 2012-01-29 (×9): qty 1

## 2012-01-29 MED ORDER — CYANOCOBALAMIN 1000 MCG/ML IJ SOLN
1000.0000 ug | Freq: Once | INTRAMUSCULAR | Status: AC
  Administered 2012-01-29: 1000 ug via INTRAMUSCULAR
  Filled 2012-01-29: qty 1

## 2012-01-29 MED ORDER — MOMETASONE FURO-FORMOTEROL FUM 200-5 MCG/ACT IN AERO: 2.0000 | INHALATION_SPRAY | Freq: Two times a day (BID) | RESPIRATORY_TRACT | Status: DC

## 2012-01-29 MED ORDER — FERUMOXYTOL INJECTION 510 MG/17 ML
510.0000 mg | Freq: Once | INTRAVENOUS | Status: AC
  Administered 2012-01-29: 510 mg via INTRAVENOUS
  Filled 2012-01-29: qty 17

## 2012-01-29 NOTE — Progress Notes (Signed)
  CARE MANAGEMENT NOTE 01/29/2012  Patient:  SHAYANN, GARBUTT   Account Number:  [id number]  Date Initiated:  01/29/2012  Documentation initiated by:  Zaiah Credeur  Subjective/Objective Assessment:   pt with hx of copd and pe present with dyspnea and o2 sats at 89%     Action/Plan:   lives at home   Anticipated DC Date:  02/01/2012   Anticipated DC Plan:  HOME/SELF CARE  In-house referral  NA      DC Planning Services  NA      [facility] Choice  NA   Choice offered to / List presented to:  NA   DME arranged  NA      DME agency  NA     HH arranged  NA      HH agency  NA   Status of service:  In process, will continue to follow Medicare Important Message given?  NA - LOS <3 / Initial given by admissions (If response is "NO", the following Medicare IM given date fields will be blank) Date Medicare IM given:   Date Additional Medicare IM given:    Discharge Disposition:    Per UR Regulation:  Reviewed for med. necessity/level of care/duration of stay  If discussed at Long Length of Stay Meetings, dates discussed:    Comments:  [id number] Marcelle Smiling, RN, BSN, CCM No discharge needs present at time of this review Case Management [phone]

## 2012-01-29 NOTE — Progress Notes (Signed)
Subjective: Feels better.  Tolerated blood transfusion.  No shortness of breath.  Right sided chest pain improved.  No BM since admission.  No abdominal pain.    Objective: Vital signs in last 24 hours: Temp:  [97.6 F (36.4 C)-98.6 F (37 C)] 98 F (36.7 C) (06/18 0556) Pulse Rate:  [61-71] 70  (06/18 0556) Resp:  [16-24] 18  (06/18 0556) BP: (118-164)/(49-86) 138/72 mmHg (06/18 0556) SpO2:  [89 %-100 %] 93 % (06/18 0556) FiO2 (%):  [96 %] 96 % (06/17 1126) Weight:  [54.704 kg (120 lb 9.6 oz)] 54.704 kg (120 lb 9.6 oz) (06/17 1626) Weight change:  Last BM Date: 01/28/12  CBG (last 3)  No results found for this basename: GLUCAP:3 in the last 72 hours  Intake/Output from previous day: 06/17 0701 - 06/18 0700 In: 1302 [P.O.:240; I.V.:750; Blood:312] Out: -  Intake/Output this shift:    General appearance: alert and no distress Eyes: no scleral icterus Throat: oropharynx moist without erythema Resp: minimal left>right basilar crackles Cardio: regular rate and rhythm, S1, S2 normal, no murmur, click, rub or gallop GI: soft, non-tender; bowel sounds normal; no masses,  no organomegaly Extremities: no clubbing, cyanosis; trace edema  Lab Results:  Basename 01/28/12 1013  NA 136  K 3.8  CL 97  CO2 31  GLUCOSE 97  BUN 8  CREATININE 1.04  CALCIUM 8.6  MG --  PHOS --   No results found for this basename: AST:2,ALT:2,ALKPHOS:2,BILITOT:2,PROT:2,ALBUMIN:2 in the last 72 hours  Basename 01/28/12 2108 01/28/12 1013  WBC 3.3* 6.4  NEUTROABS 2.7 --  HGB 10.8* 7.9*  HCT 36.4 29.0*  MCV 77.4* 74.9*  PLT 439* 499*   Lab Results  Component Value Date   INR 1.69* 01/28/2012   INR 1.00 09/28/2011   INR 1.12 09/27/2011   No results found for this basename: CKTOTAL:3,CKMB:3,CKMBINDEX:3,TROPONINI:3 in the last 72 hours No results found for this basename: TSH,T4TOTAL,FREET3,T3FREE,THYROIDAB in the last 72 hours  Basename 01/28/12 1324  VITAMINB12 259  FOLATE >20.0  FERRITIN  7*  TIBC Not calculated due to Iron <10.  IRON <10*  RETICCTPCT 3.2*    Studies/Results: Chest Portable 1 View  01/28/2012  *RADIOLOGY REPORT*  Clinical Data: Shortness of breath, history hypertension and smoking, recent pneumonia 1 month ago  PORTABLE CHEST - 1 VIEW  Comparison: Portable exam 1023 hours compared to 05/17/2008  Findings: Upper normal heart size. Calcified tortuous aorta. Mediastinal contours stable. Diffuse bilateral interstitial infiltrates question edema versus infection. Underlying emphysematous and bronchitic changes. Small left pleural effusion. No pneumothorax. Osseous demineralization with multiple old right rib fractures.  IMPRESSION: Changes of COPD with new diffuse interstitial infiltrates question edema versus infection.  Original Report Authenticated By: Lollie Marrow, M.D.     Medications: Scheduled:   . albuterol  5 mg Nebulization Once  . amLODipine  2.5 mg Oral QHS  . atorvastatin  40 mg Oral q1800  . calcium carbonate  1 tablet Oral BID WC  . DULoxetine  60 mg Oral Daily  . famotidine  20 mg Oral BID  . fentaNYL  75 mcg Transdermal QODAY  . gabapentin  300 mg Oral Daily  . ipratropium  0.5 mg Nebulization Once  . Mometasone Furo-Formoterol Fum  2 puff Inhalation BID  . nadolol  20 mg Oral Daily  . predniSONE  5 mg Oral Q breakfast  . predniSONE  60 mg Oral Once  . rivaroxaban  20 mg Oral QPM  . sodium chloride  3 mL  Intravenous Q12H  . sucralfate  1 g Oral TID AC & HS  . traMADol      . Vitamin D (Ergocalciferol)  50,000 Units Oral Q7 days  . DISCONTD: calcium carbonate  600 mg Oral BID WC  . DISCONTD: Rivaroxaban  1 tablet Oral QPM   Continuous:   Assessment/Plan: Active Problems: 1. Dyspnea/Hypoxia- CT not done as patient is allergic to contrast dye.  Will change to CT Chest without contrast to evaluate abnormal CXR (very low pretest probability for PE based on anticoagulation with Xeralto and resolution of pleuritic chest pain).  Wean O2 to  off and ambulate to determine if O2 needed.  Pending CT results will consider outpatient PFTs as she does have underlying COPD.  Continue formoterol.  Consider spiriva. 2. Iron deficiency anemia- severe iron deficiency despite negative GI workup.  ?iron malabsorption (recent Celiac tests were negative).  Will give IV Iron today and monitor X 24 hours.  Will refer to Hematology as outpatient for further evaluation/management of Iron Deficiency. 3. B12 deficiency- will resume B12 monthly (has not received since 2/13). 4. Adrenal insufficiency- continue steroids. 5. Recurrent pulmonary embolism- continue Xeralto. Low suspicion of acute PE.   6. Disposition- anticipate discharge tomorrow if Hg stable, hypoxia resolved.    LOS: 1 day   Seif Teichert,W DOUGLAS 01/29/2012, 8:04 AM

## 2012-01-29 NOTE — Progress Notes (Signed)
Pharmacy: IV Iron  71 yo F with iron deficiency anemia to receive IV iron.   Iron level <10  Current Hgb 10.8 (<7.9) after transfusion   Plan: 1.) Feraheme 510 mg IV x 1 today 2.) Ferrous sulfate 325 mg PO TID w/ meals 3.) Bowel regimen w/ senna S 2 tabs QHS.  4.) Monitor hgb/iron.  Evalisse Prajapati, Loma Messing PharmD 8:44 AM 01/29/2012

## 2012-01-29 NOTE — ED Provider Notes (Signed)
Medical screening examination/treatment/procedure(s) were conducted as a shared visit with non-physician practitioner(s) and myself.  I personally evaluated the patient during the encounter  Cheri Guppy, MD 01/29/12 1351

## 2012-01-30 ENCOUNTER — Inpatient Hospital Stay (HOSPITAL_COMMUNITY): Payer: Medicare Other

## 2012-01-30 DIAGNOSIS — R109 Unspecified abdominal pain: Secondary | ICD-10-CM | POA: Diagnosis present

## 2012-01-30 LAB — BASIC METABOLIC PANEL
BUN: 15 mg/dL (ref 6–23)
Calcium: 9.5 mg/dL (ref 8.4–10.5)
GFR calc Af Amer: 72 mL/min — ABNORMAL LOW (ref >90)
GFR calc non Af Amer: 63 mL/min — ABNORMAL LOW (ref >90)
Potassium: 3.2 mEq/L — ABNORMAL LOW (ref 3.5–5.1)
Sodium: 139 mEq/L (ref 135–145)

## 2012-01-30 LAB — LIPASE, BLOOD: Lipase: 7 U/L — ABNORMAL LOW (ref 11–59)

## 2012-01-30 LAB — CBC
Hemoglobin: 12.2 g/dL (ref 12.0–15.0)
MCH: 22.8 pg — ABNORMAL LOW (ref 26.0–34.0)
MCHC: 29.5 g/dL — ABNORMAL LOW (ref 30.0–36.0)
Platelets: 502 10*3/uL — ABNORMAL HIGH (ref 150–400)
RDW: 19.3 % — ABNORMAL HIGH (ref 11.5–15.5)

## 2012-01-30 LAB — PRO B NATRIURETIC PEPTIDE: Pro B Natriuretic peptide (BNP): 458.2 pg/mL — ABNORMAL HIGH (ref 0–125)

## 2012-01-30 LAB — PROTEIN ELECTROPHORESIS, SERUM
Albumin ELP: 44.4 % — ABNORMAL LOW (ref 55.8–66.1)
Alpha-2-Globulin: 20.4 % — ABNORMAL HIGH (ref 7.1–11.8)
Beta 2: 6.1 % (ref 3.2–6.5)
Beta Globulin: 7.1 % (ref 4.7–7.2)

## 2012-01-30 LAB — LACTIC ACID, PLASMA: Lactic Acid, Venous: 0.7 mmol/L (ref 0.5–2.2)

## 2012-01-30 LAB — HEPATIC FUNCTION PANEL
AST: 18 U/L (ref 0–37)
Bilirubin, Direct: 0.1 mg/dL (ref 0.0–0.3)
Indirect Bilirubin: 0.4 mg/dL (ref 0.3–0.9)
Total Bilirubin: 0.5 mg/dL (ref 0.3–1.2)

## 2012-01-30 MED ORDER — TECHNETIUM TO 99M ALBUMIN AGGREGATED
3.0000 | Freq: Once | INTRAVENOUS | Status: AC | PRN
  Administered 2012-01-30: 3 via INTRAVENOUS

## 2012-01-30 MED ORDER — POTASSIUM CHLORIDE CRYS ER 20 MEQ PO TBCR
40.0000 meq | EXTENDED_RELEASE_TABLET | Freq: Once | ORAL | Status: AC
  Administered 2012-01-30: 40 meq via ORAL
  Filled 2012-01-30: qty 2

## 2012-01-30 MED ORDER — SODIUM CHLORIDE 0.9 % IV SOLN
INTRAVENOUS | Status: DC
  Administered 2012-01-30 (×2): via INTRAVENOUS

## 2012-01-30 MED ORDER — ONDANSETRON HCL 4 MG/2ML IJ SOLN
4.0000 mg | Freq: Four times a day (QID) | INTRAMUSCULAR | Status: DC | PRN
  Administered 2012-01-30 – 2012-01-31 (×4): 4 mg via INTRAVENOUS
  Filled 2012-01-30 (×4): qty 2

## 2012-01-30 NOTE — Progress Notes (Signed)
Patient not feeling well today.  Patient has not eaten all day, having nausea and abdominal pain.  Patient refused to go for a walk in the hall.

## 2012-01-30 NOTE — Progress Notes (Signed)
Order to discontinue oxygen.  Patient pulse ox on room air 78%.  Dr. Clelia Croft notified.  Patient ordered a chest xray and bnp.  Patient is asymptomatic.  Will continue to monitor.

## 2012-01-30 NOTE — Progress Notes (Signed)
Subjective: "I feel so awful"- describes increased upper abdominal pain, nausea.  No vomitting or diarrhea.  +flatus. Has had similar symptoms in the past intermittently.    Objective: Vital signs in last 24 hours: Temp:  [98.2 F (36.8 C)-99.3 F (37.4 C)] 98.2 F (36.8 C) (06/19 0518) Pulse Rate:  [63-69] 69  (06/19 0518) Resp:  [20] 20  (06/19 0518) BP: (124-154)/(51-76) 154/68 mmHg (06/19 0518) SpO2:  [93 %-96 %] 93 % (06/19 0518) Weight:  [54.7 kg (120 lb 9.5 oz)] 54.7 kg (120 lb 9.5 oz) (06/19 0649) Weight change: -0.004 kg (-0.1 oz) Last BM Date: 01/28/12  CBG (last 3)  No results found for this basename: GLUCAP:3 in the last 72 hours  Intake/Output from previous day: 06/18 0701 - 06/19 0700 In: 480 [P.O.:480] Out: 1650 [Urine:1650] Intake/Output this shift:    General appearance: in obvious discomfort with apparent nausea Eyes: no scleral icterus Throat: oropharynx moist without erythema Resp: clear to auscultation bilaterally Cardio: regular rate and rhythm, S1, S2 normal, no murmur, click, rub or gallop GI: soft, non-tender; hypoactive bowel sounds, diffuse tenderness Extremities: no clubbing, cyanosis or edema   Lab Results:  [facility] 01/30/12 0506 01/29/12 0905  NA 139 142  K 3.2* 3.7  CL 98 103  CO2 30 31  GLUCOSE 74 89  BUN 15 15  CREATININE 0.92 0.99  CALCIUM 9.5 8.7  MG -- --  PHOS -- --   No results found for this basename: AST:2,ALT:2,ALKPHOS:2,BILITOT:2,PROT:2,ALBUMIN:2 in the last 72 hours  Basename 01/30/12 0506 01/29/12 0905 01/28/12 2108  WBC 6.7 5.5 --  NEUTROABS -- -- 2.7  HGB 12.2 10.7* --  HCT 41.3 35.3* --  MCV 77.3* 76.9* --  PLT 502* 455* --   Lab Results  Component Value Date   INR 1.69* 01/28/2012   INR 1.00 09/28/2011   INR 1.12 09/27/2011   No results found for this basename: CKTOTAL:3,CKMB:3,CKMBINDEX:3,TROPONINI:3 in the last 72 hours No results found for this basename: TSH,T4TOTAL,FREET3,T3FREE,THYROIDAB in the last  72 hours  Basename 01/28/12 1324  VITAMINB12 259  FOLATE >20.0  FERRITIN 7*  TIBC Not calculated due to Iron <10.  IRON <10*  RETICCTPCT 3.2*    Studies/Results: Ct Chest Wo Contrast  01/29/2012  *RADIOLOGY REPORT*  Clinical Data: Hypoxia, abnormal chest x-ray  CT CHEST WITHOUT CONTRAST  Technique:  Multidetector CT imaging of the chest was performed following the standard protocol without IV contrast.  Comparison: March 01, 2006; correlation with chest x-ray performed yesterday  Findings: There is a stable 2.6 mm low-density nodule in the right upper lobe.  No new or suspicious lung nodules are identified bilaterally. There are no areas of focal consolidation.  There is hyperexpansion and centrilobular emphysema, to a similar degree compared with the prior study.  Small bilateral pleural effusions are now present, and there are hazy ground-glass opacities bilaterally with some lower lobe intralobular interstitial prominence which suggests pulmonary edema.  There is no axillary, hilar, or mediastinal adenopathy.  Aortic, subclavian artery, and coronary artery atherosclerotic calcification is again noted.  Within the visualized upper abdomen, stable, mild biliary ductal dilatation is again noted and unchanged.  There are multiple mid thoracic wedge compression fractures, all of which appear unchanged.  IMPRESSION: Findings are consistent with mild pulmonary edema, including small pleural effusions, superimposed upon centrilobular emphysema.  Original Report Authenticated By: Brandon Melnick, M.D.   Chest Portable 1 View  01/28/2012  *RADIOLOGY REPORT*  Clinical Data: Shortness of breath, history hypertension and  smoking, recent pneumonia 1 month ago  PORTABLE CHEST - 1 VIEW  Comparison: Portable exam 1023 hours compared to 05/17/2008  Findings: Upper normal heart size. Calcified tortuous aorta. Mediastinal contours stable. Diffuse bilateral interstitial infiltrates question edema versus infection.  Underlying emphysematous and bronchitic changes. Small left pleural effusion. No pneumothorax. Osseous demineralization with multiple old right rib fractures.  IMPRESSION: Changes of COPD with new diffuse interstitial infiltrates question edema versus infection.  Original Report Authenticated By: Lollie Marrow, M.D.     Medications: Scheduled:   . amLODipine  2.5 mg Oral QHS  . atorvastatin  40 mg Oral q1800  . calcium carbonate  1 tablet Oral BID WC  . cyanocobalamin  1,000 mcg Intramuscular Once  . DULoxetine  60 mg Oral Daily  . famotidine  20 mg Oral BID  . fentaNYL  75 mcg Transdermal QODAY  . ferrous sulfate  325 mg Oral TID WC  . ferumoxytol  510 mg Intravenous Once  . gabapentin  300 mg Oral Daily  . mometasone-formoterol  2 puff Inhalation BID  . nadolol  20 mg Oral Daily  . predniSONE  5 mg Oral Q breakfast  . rivaroxaban  20 mg Oral QPM  . senna-docusate  2 tablet Oral QHS  . sodium chloride  3 mL Intravenous Q12H  . sucralfate  1 g Oral TID AC & HS  . Vitamin D (Ergocalciferol)  50,000 Units Oral Q7 days  . DISCONTD: Mometasone Furo-Formoterol Fum  2 puff Inhalation BID  . DISCONTD: Mometasone Furo-Formoterol Fum  2 puff Inhalation BID   Continuous:   Assessment/Plan: Active Problems: 1. Dyspnea/Hypoxia- CT shows chronic changes with possible superimposed edema (proBNP was normal on admission so doubt significant edema).  Will continue COPD meds and wean O2 off.  Consider outpatient PFTs. 2. Abdominal pain- intermittent with negative evaluation in the past.  Will treat symptoms with anti-emetics and pain medications.  If persistent consider CT angiography to rule out mesenteric ischemia (will need contrast allergy prophylaxis).  Check lactate, lipase and LFTs.  Prior cholecystectomy.  Intolerant to PPI.  Continue Pepcid and Carafate.  Start IV fluids. 3. Iron deficiency anemia- severe iron deficiency despite negative GI workup. ?iron malabsorption (recent Celiac tests  were negative). Hg improved after 2 units PRBCs, IV iron and B12 injection. Consider referral to Hematology as outpatient for further evaluation/management of Iron Deficiency.  4. B12 deficiency- will resume B12 monthly (has not received since 2/13 until yesterday).  5. Adrenal insufficiency- continue steroids.  6. Recurrent pulmonary embolism- continue Xeralto. 7. Disposition- anticipate discharge tomorrow if abdominal pain improved.   LOS: 2 days   Armya Westerhoff,W DOUGLAS 01/30/2012, 7:22 AM

## 2012-01-31 ENCOUNTER — Encounter (HOSPITAL_COMMUNITY): Payer: Self-pay | Admitting: Family Medicine

## 2012-01-31 LAB — UIFE/LIGHT CHAINS/TP QN, 24-HR UR
Albumin, U: DETECTED
Alpha 1, Urine: DETECTED — AB
Alpha 2, Urine: DETECTED — AB
Free Kappa Lt Chains,Ur: 11.6 mg/dL — ABNORMAL HIGH (ref 0.14–2.42)
Free Kappa/Lambda Ratio: 11.05 ratio — ABNORMAL HIGH (ref 2.04–10.37)
Total Protein, Urine: 14.2 mg/dL

## 2012-01-31 LAB — BASIC METABOLIC PANEL
Calcium: 9 mg/dL (ref 8.4–10.5)
GFR calc Af Amer: 67 mL/min — ABNORMAL LOW (ref >90)
GFR calc non Af Amer: 58 mL/min — ABNORMAL LOW (ref >90)
Potassium: 4 mEq/L (ref 3.5–5.1)
Sodium: 138 mEq/L (ref 135–145)

## 2012-01-31 LAB — CBC
Hemoglobin: 11.8 g/dL — ABNORMAL LOW (ref 12.0–15.0)
MCH: 22.6 pg — ABNORMAL LOW (ref 26.0–34.0)
Platelets: 485 10*3/uL — ABNORMAL HIGH (ref 150–400)
RBC: 5.21 MIL/uL — ABNORMAL HIGH (ref 3.87–5.11)
WBC: 5.9 10*3/uL (ref 4.0–10.5)

## 2012-01-31 MED ORDER — ONDANSETRON HCL 4 MG PO TABS: 4.0000 mg | ORAL_TABLET | Freq: Three times a day (TID) | ORAL | Status: DC | PRN

## 2012-01-31 MED ORDER — FERROUS SULFATE 325 (65 FE) MG PO TABS: 325.0000 mg | ORAL_TABLET | Freq: Three times a day (TID) | ORAL | Status: DC

## 2012-01-31 NOTE — Discharge Summary (Signed)
DISCHARGE SUMMARY  [name]  MR#: [id number]  DOB:1941-05-30  Date of Admission: 01/28/2012 Date of Discharge: 01/31/2012  Attending Physician:Everlena Mackley,W DOUGLAS  Patient's [name],[name] Katherine Diener, MD  Consults:  none indicated  Discharge Diagnoses: Active Problems:  Adrenal insufficiency  Recurrent pulmonary embolism  Iron deficiency anemia  B12 deficiency  Hypoxia  Dyspnea  Abdominal pain  Past Medical History  Diagnosis Date  . Polio   . Osteoporosis     multiple compression fractures  . Crohn's disease     since 1966 on chronic steroids s/p ileal resection, fistula repair  . Brain tumor   . Migraines   . Hypercholesteremia   . Hypertension   . GERD (gastroesophageal reflux disease)   . Anxiety   . Depression   . Emphysema   . Macular degeneration   . Cataract   . Arthritis   . Pernicious anemia   . Primary adrenal deficiency   . COPD (chronic obstructive pulmonary disease)   . Neuropathy, idiopathic   . Anticoagulated on warfarin     Hx of DVT,   Past Surgical History  Procedure Date  . Cholecystectomy   . Cataract repair   . Ileal resection   . Hemicolectomy     rt due to crohns  . Colonoscopy 08/17/2011    Procedure: COLONOSCOPY;  Surgeon: Freddy Jaksch, MD;  Location: [facility];  Service: Endoscopy;  Laterality: N/A;  . Givens capsule study 09/27/2011    Procedure: GIVENS CAPSULE STUDY;  Surgeon: Freddy Jaksch, MD;  Location: [facility];  Service: Endoscopy;  Laterality: N/A;    Discharge Medications: Medication List  As of 01/31/2012  7:56 AM   STOP taking these medications         saccharomyces boulardii 250 MG capsule         TAKE these medications         ALIGN 4 MG Caps   Take 1 capsule by mouth daily.      ALPRAZolam 0.25 MG tablet   Commonly known as: XANAX   Take 0.25 mg by mouth 3 (three) times daily as needed. For anxiety.      amLODipine 2.5 MG tablet   Commonly known as: NORVASC   Take 2.5 mg by mouth at  bedtime.      calcium carbonate 600 MG Tabs   Commonly known as: OS-CAL   Take 600 mg by mouth 2 (two) times daily with a meal.      DULERA 200-5 MCG/ACT Aero   Generic drug: Mometasone Furo-Formoterol Fum   Inhale 2 puffs into the lungs 2 (two) times daily.      DULoxetine 60 MG capsule   Commonly known as: CYMBALTA   Take 60 mg by mouth daily.      fentaNYL 75 MCG/HR   Commonly known as: DURAGESIC - dosed mcg/hr   Place 1 patch onto the skin every other day.      ferrous sulfate 325 (65 FE) MG tablet   Take 1 tablet (325 mg total) by mouth 3 (three) times daily with meals.      GRALISE 300 MG Tabs   Generic drug: Gabapentin (PHN)   Take 1 tablet by mouth daily.      nadolol 20 MG tablet   Commonly known as: CORGARD   Take 20 mg by mouth daily.      ondansetron 4 MG tablet   Commonly known as: ZOFRAN   Take 1 tablet (4 mg total) by mouth every 8 (  eight) hours as needed for nausea.      potassium chloride 10 MEQ CR tablet   Commonly known as: KLOR-CON   Take 5-10 mEq by mouth daily. Takes 1 tablet daily Monday through Friday for dosage and takes 1/2 tablet Saturday and Sunday for dosage      predniSONE 5 MG tablet   Commonly known as: DELTASONE   Take 5 mg by mouth daily.      ranitidine 300 MG capsule   Commonly known as: ZANTAC   Take 300 mg by mouth 2 (two) times daily.      Rivaroxaban 20 MG Tabs   Take 1 tablet by mouth every evening.      simvastatin 80 MG tablet   Commonly known as: ZOCOR   Take 1 tablet (80 mg total) by mouth daily.      sucralfate 1 G tablet   Commonly known as: CARAFATE   Take 1 g by mouth 4 (four) times daily.      traMADol 50 MG tablet   Commonly known as: ULTRAM   Take 100 mg by mouth every 8 (eight) hours as needed. Pain.Marland KitchenMarland KitchenMaximum dose= 8 tablets per day      Vitamin D (Ergocalciferol) 50000 UNITS Caps   Commonly known as: DRISDOL   Take 50,000 Units by mouth every 7 (seven) days. Taken on Sundays.             Hospital Procedures: Dg Chest 2 View  01/30/2012  *RADIOLOGY REPORT*  Clinical Data: Hypoxia  CHEST - 2 VIEW  Comparison: 01/29/2012 and 01/28/2012  Findings: Cardiomediastinal silhouette is stable.  Stable hyperinflation and chronic interstitial prominence.  Multiple compression fractures mid and lower thoracic spine are stable.  No convincing pulmonary edema.  There is small left pleural effusion with left basilar atelectasis.  Small right pleural effusion.  IMPRESSION: No convincing pulmonary edema.  Stable hyperinflation and chronic interstitial prominence.  Bilateral small pleural effusion.  Left basilar atelectasis.  Stable compression fractures thoracic spine.  Original Report Authenticated By: Natasha Mead, M.D.   Ct Chest Wo Contrast  01/29/2012  *RADIOLOGY REPORT*  Clinical Data: Hypoxia, abnormal chest x-ray  CT CHEST WITHOUT CONTRAST  Technique:  Multidetector CT imaging of the chest was performed following the standard protocol without IV contrast.  Comparison: March 01, 2006; correlation with chest x-ray performed yesterday  Findings: There is a stable 2.6 mm low-density nodule in the right upper lobe.  No new or suspicious lung nodules are identified bilaterally. There are no areas of focal consolidation.  There is hyperexpansion and centrilobular emphysema, to a similar degree compared with the prior study.  Small bilateral pleural effusions are now present, and there are hazy ground-glass opacities bilaterally with some lower lobe intralobular interstitial prominence which suggests pulmonary edema.  There is no axillary, hilar, or mediastinal adenopathy.  Aortic, subclavian artery, and coronary artery atherosclerotic calcification is again noted.  Within the visualized upper abdomen, stable, mild biliary ductal dilatation is again noted and unchanged.  There are multiple mid thoracic wedge compression fractures, all of which appear unchanged.  IMPRESSION: Findings are consistent with mild  pulmonary edema, including small pleural effusions, superimposed upon centrilobular emphysema.  Original Report Authenticated By: Brandon Melnick, M.D.   Nm Pulmonary Perfusion  01/30/2012  *RADIOLOGY REPORT*  Clinical Data: Shortness of breath.  Emphysema.  Hypoxia.  Chest pain.  Previous lung surgery.  NM PULMONARY PERFUSION PARTICULATE  Radiopharmaceutical: CURIE MAA TECHNETIUM TO 93M ALBUMIN AGGREGATED  Comparison: Chest radiographs obtained today.  Findings: The lungs are hyperexpanded.  No perfusion defects seen.  IMPRESSION: COPD.  No evidence of pulmonary embolism.  Original Report Authenticated By: Darrol Angel, M.D.   Chest Portable 1 View  01/28/2012  *RADIOLOGY REPORT*  Clinical Data: Shortness of breath, history hypertension and smoking, recent pneumonia 1 month ago  PORTABLE CHEST - 1 VIEW  Comparison: Portable exam 1023 hours compared to 05/17/2008  Findings: Upper normal heart size. Calcified tortuous aorta. Mediastinal contours stable. Diffuse bilateral interstitial infiltrates question edema versus infection. Underlying emphysematous and bronchitic changes. Small left pleural effusion. No pneumothorax. Osseous demineralization with multiple old right rib fractures.  IMPRESSION: Changes of COPD with new diffuse interstitial infiltrates question edema versus infection.  Original Report Authenticated By: Lollie Marrow, M.D.   Transfusion 2 units packed red blood cells (6/17)  History of Present Illness: Katherine Mckay is a 71 year old white female with a history of recurrent PE/DVT on chronic anticoagulation, Crohn's disease, and COPD who presented to the emergency department with the complaint of shortness of breath. Patient states that she has been chronically ill over the past several months. She has been undergoing an evaluation for her chronic anemia which has included an EGD, colonoscopy, capsule endoscopy which is been unrevealing. Her most recent hemoglobin was 8.5 at Dr.  Donavan Burnet his office about 2 weeks ago. At that time she was also, have a platelet count of 99. She is also been treated recently for possible pneumonia with Avelox based on the CT report (on 5/21) showing bilateral interstitial process. Over the past several weeks she's become more weak. This weekend she noticed a significant increase in fatigue and shortness of breath. Concurrently, she developed right lower rib pain which is worse with a deep breath. She also noticed a change in bowel movements with 2-3 bowel movements per day. She denies any melena or hematochezia. Given the increased shortness of breath and right-sided chest pain she presented to the emergency department where her oxygen saturations are 89% on room air, hemoglobin is 7.9 and chest x-ray shows persistent bilateral interstitial process. We were called to admit the patient.   Hospital Course: Mrs. Falwell was admitted to a telemetry bed. She was transfused 2 units packed red blood cells for her anemia. Secondary evaluation was consistent with severe iron deficiency anemia with a ferritin of 7 and B12 deficiency. [name] had an extensive GI evaluation in the past so this was not repeated. She did not have any bowel movements during her hospitalization making significant GI bleed unlikely. It is more likely that she has an iron absorption issue. She was given a treatment of IV iron which she tolerated well.  She was restarted on her B12 injection which she should continue monthly. Also consider hematology referral for IV iron treatments as needed. With transfusion, IV iron, and B12 injection her hemoglobin has been stable and is up to 11.8 on discharge.  Despite transfusion she remained hypoxic. After discontinuing her oxygen her saturations were 78% room air. To evaluate this further she underwent a CT of the chest with results as above consistent with chronic interstitial process. Clinically I suspect this is consistent with COPD with underlying  fibrosis, though atypical infection including MAC cannot be excluded. She did not have a significant white blood count elevation or fever to suggest acute infection and she has been treated recently with Avelox without improvement. She also had a VQ scan to rule out PE which was negative as  above. I had a low clinical suspicion of this given her anticoagulation with [place] but felt it needed to be excluded given her history of recurrent PEs. She will need further outpatient follow up with pulmonology with consideration of an echocardiogram to rule out pulmonary hypertension or shunt physiology. In the meantime, she will be discharged with home oxygen with close outpatient followup. With oxygen therapy her sats have remained above 90%.  The patient also had transient upper abdominal pain which has improved. [name] had this intermittently in the past. I did not invoke a workup for this during his hospitalization given her extensive GI evaluation recently. This may simply be constipation although bowel ischemia needs to be considered. If recurrent, consider outpatient CT angiography with desensitization to contrast given her prior allergy.  At this point, the patient is stable and ready for discharge home. Her abdominal pain has resolved. She is maintaining saturations above 90% on oxygen therapy. Home oxygen will be arranged and she'll be discharged with close outpatient follow.  Day of Discharge Exam BP 138/76  Pulse 64  Temp 97.5 F (36.4 C) (Oral)  Resp 18  Ht 5\' 3"  (1.6 m)  Wt 54.7 kg (120 lb 9.5 oz)  BMI 21.36 kg/m2  SpO2 99%  Physical Exam: General appearance: Chronically ill in no acute distress Eyes: no scleral icterus Throat: oropharynx moist without erythema Resp: Minimal bibasilar crackles Cardio: regular rate and rhythm, S1, S2 normal, no murmur, click, rub or gallop GI: soft, non-tender; bowel sounds normal; no masses,  no organomegaly Extremities: no clubbing, cyanosis or  edema  Discharge Labs:  [facility] 01/31/12 0456 01/30/12 0506  NA 138 139  K 4.0 3.2*  CL 99 98  CO2 29 30  GLUCOSE 57* 74  BUN 18 15  CREATININE 0.98 0.92  CALCIUM 9.0 9.5  MG -- --  PHOS -- --    Basename 01/30/12 0825  AST 18  ALT 16  ALKPHOS 81  BILITOT 0.5  PROT 6.4  ALBUMIN 2.5*    Basename 01/31/12 0456 01/30/12 0506 01/28/12 2108  WBC 5.9 6.7 --  NEUTROABS -- -- 2.7  HGB 11.8* 12.2 --  HCT 41.3 41.3 --  MCV 79.3 77.3* --  PLT 485* 502* --   Lab Results  Component Value Date   INR 1.69* 01/28/2012   INR 1.00 09/28/2011   INR 1.12 09/27/2011   No results found for this basename: CKTOTAL:3,CKMB:3,CKMBINDEX:3,TROPONINI:3 in the last 72 hours No results found for this basename: TSH,T4TOTAL,FREET3,T3FREE,THYROIDAB in the last 72 hours  Basename 01/28/12 1324  VITAMINB12 259  FOLATE >20.0  FERRITIN 7*  TIBC Not calculated due to Iron <10.  IRON <10*  RETICCTPCT 3.2*   Additional labs:  Lactic acid 0.7 Lipase 7 proBNP 175 on admission and 458 on 6/19 (felt not to be clinically significant) SPEP-negative for him despite UPEP-pending  Discharge instructions: Discharge Orders    Future Orders Please Complete By Expires   For home use only DME oxygen      Comments:   2 liters continuous   Diet - low sodium heart healthy      Increase activity slowly      Discharge instructions      Comments:   Wear oxygen at all times. Call if increased shortness of breath, increased abdominal pain, nausea/vomiting, or blood in your stools.      Disposition: To home  Follow-up Appts: Follow-up with Dr[facility] [name] at [facility] in 1 week.  Call for appointment.  Condition on Discharge: Improved  Tests Needing Follow-up: UPEP.   Would recommend CBC at followup visit  Signed: Nanda Bittick,W DOUGLAS 01/31/2012, 7:56 AM

## 2012-01-31 NOTE — Progress Notes (Signed)
[id number] home O2 with the patient she has used [facility] in the past and would like to continue with this agency.  Katharina Caper with [facility] notified.

## 2012-01-31 NOTE — Discharge Instructions (Signed)
Hypoxemia  Hypoxemia occurs when your blood does not have enough oxygen. The body cannot work well when it does not have enough oxygen because every part of your body needs oxygen. Oxygen travels to all parts of the body through your blood. Hypoxemia can develop suddenly or can come on slowly.  CAUSES   Long-term (chronic) lung diseases (chronic obstructive pulmonary disease [COPD], pulmonary fibrosis, or interstitial lung disease).   A condition in which there is a pause in your breathing (sleep apnea).   Fluid buildup in your lungs (pulmonary edema).   Lung infection (pneumonia).   Lung or throat cancer.   Certain diseasesthat affect nerves or muscles.   A collapsed lung (pneumothorax).   A blood clot in the lungs (pulmonary embolus).   Low levels of red blood cells (anemia).   Poor circulation.   Slow or shallow breathing (hypoventilation).   Certain medicines.   High altitudes.   Toxic chemicals and gases.  SYMPTOMS  Symptoms vary greatly and depend on the cause. How fast the hypoxemia develops matters, too. Symptoms are often very clear when it comes on quickly. It can be hard to notice symptoms when hypoxemia develops very slowly. Symptoms can include:   Shortness of breath (dyspnea).   Bluish color of the skin, lips, or nail beds.   Breathing that is fast, noisy, or shallow.   A fast heartbeat.   Feeling tired or sleepy.   Being confused or feeling anxious.  DIAGNOSIS  To decide if you have hypoxemia, your caregiver may perform:   A physical exam.   Blood tests.   A pulse oximetry. A sensor will be put on your finger, toe, or earlobe to measure the percent of oxygen in your blood.   Imaging tests (X-rays, CT scans).   An electrocardiogram (EKG).   An echocardiogram.  TREATMENT  Treatment depends on what is causing your condition. You may be put on oxygen therapy which gives you extra oxygen. Oxygen therapy may last just until the cause can be found and then other treatment would  begin. For some people, however, oxygen is needed for a long time.  HOME CARE INSTRUCTIONS  What you need to do at home will vary from person to person. It will depend on your treatment plan, but everyone with hypoxemia should follow these directions.   Take all medicines directed by your caregiver. Only take medicines that are approved by your caregiver.   Follow oxygen safety measures.   Always have a backup supply of oxygen.   Do not smoke around oxygen.   Handle the oxygen tanks carefully and as instructed.   If you smoke, quit. Stay away from people who smoke.   Eat a healthy diet. Try eating more times a day, but eat less each time.   Prevent infections by getting routine vaccinations, avoiding those who are ill, and following good hygiene practices.   Get plenty of sleep.   Look for ways to save your energy.   Plan activities for the time of day when your energy level is high.   Arrange for help with daily activities as needed.   Pay attention to your mental health. Manage stress and get help if you feel anxious or depressed.   Keep all follow-up appointments with your caregivers.  SEEK MEDICAL CARE IF:   You have any questions or concerns about your oxygen therapy.   You have questions about your treatment.   You still have trouble breathing.   You become   short of breath when you exercise.   You are tired when you wake up.   You have a headache when you wake up.  SEEK IMMEDIATE MEDICAL CARE IF:    Your breathing gets worse.   You have shortness of breath with normal activity.   You have a bluish color of the skin, lips, or nail beds.   You feel very tired.   You become confused.   You cough up dark mucus.   You have chest pain.   You have a fever.  Document Released: 02/12/2011 Document Revised: 07/19/2011 Document Reviewed: 02/12/2011  [employer] Patient Information 2012 [employer].

## 2012-01-31 NOTE — Progress Notes (Signed)
Patient discharged to home.  Patient's husband in the room with patient.  Reviewed discharge instructions with patient and husband.  Assessed for questions at this time. No further questions or concerns.  Patient received tank of oxygen to go home with.  Oxygen set up for patient at home by case management.  IV removed from left forearm.  Patient escorted to lobby via wheelchair by nursing tech.  Patient discharged.

## 2012-01-31 NOTE — Progress Notes (Signed)
SATURATION QUALIFICATIONS:  Patient Saturations on Room Air at Rest = 86%   Patient Saturations on Room Air while Ambulating = 81%  Patient Saturations on 3 Liters of oxygen while Ambulating = 92%

## 2012-02-06 ENCOUNTER — Emergency Department (HOSPITAL_COMMUNITY)
Admission: EM | Admit: 2012-02-06 | Discharge: 2012-02-07 | Disposition: A | Discharge status: Home / Self Care | Payer: Medicare Other | Attending: Emergency Medicine | Admitting: Emergency Medicine

## 2012-02-06 ENCOUNTER — Encounter (HOSPITAL_COMMUNITY): Payer: Self-pay | Admitting: Emergency Medicine

## 2012-02-06 DIAGNOSIS — R4182 Altered mental status, unspecified: Secondary | ICD-10-CM | POA: Insufficient documentation

## 2012-02-06 DIAGNOSIS — R531 Weakness: Secondary | ICD-10-CM

## 2012-02-06 DIAGNOSIS — F29 Unspecified psychosis not due to a substance or known physiological condition: Secondary | ICD-10-CM | POA: Insufficient documentation

## 2012-02-06 DIAGNOSIS — R0602 Shortness of breath: Secondary | ICD-10-CM | POA: Insufficient documentation

## 2012-02-06 DIAGNOSIS — R509 Fever, unspecified: Secondary | ICD-10-CM | POA: Insufficient documentation

## 2012-02-06 DIAGNOSIS — R5381 Other malaise: Secondary | ICD-10-CM | POA: Insufficient documentation

## 2012-02-06 DIAGNOSIS — I10 Essential (primary) hypertension: Secondary | ICD-10-CM | POA: Insufficient documentation

## 2012-02-06 DIAGNOSIS — R5383 Other fatigue: Secondary | ICD-10-CM

## 2012-02-06 NOTE — ED Notes (Signed)
Pt presents to ED with c/o weakness and confusion. Pt's husband was historian during assessment. Husband states pt has been admitted to hospital multiple times for similar symptoms however the confusion is new. Husband states pt is "unable to do what she normally does". Husband is worried pt will get up at night while he asleep and hurt herself.

## 2012-02-06 NOTE — ED Notes (Signed)
Bed:WA02<BR> Expected date:<BR> Expected time:<BR> Means of arrival:<BR> Comments:<BR> closed

## 2012-02-06 NOTE — ED Notes (Signed)
Pt alert, arrives from home, c/o increased weakness, confusion, several recent inpt adm, resp even unlabored, skin pwd

## 2012-02-07 ENCOUNTER — Emergency Department (HOSPITAL_COMMUNITY): Payer: Medicare Other

## 2012-02-07 LAB — CBC WITH DIFFERENTIAL/PLATELET
Basophils Relative: 0 % (ref 0–1)
Eosinophils Absolute: 0.1 10*3/uL (ref 0.0–0.7)
Hemoglobin: 12.8 g/dL (ref 12.0–15.0)
Lymphs Abs: 1.4 10*3/uL (ref 0.7–4.0)
MCH: 24.1 pg — ABNORMAL LOW (ref 26.0–34.0)
MCHC: 30.2 g/dL (ref 30.0–36.0)
MCV: 79.7 fL (ref 78.0–100.0)
Monocytes Absolute: 1.4 10*3/uL — ABNORMAL HIGH (ref 0.1–1.0)
Neutrophils Relative %: 69 % (ref 43–77)
Platelets: 319 10*3/uL (ref 150–400)

## 2012-02-07 LAB — URINE MICROSCOPIC-ADD ON

## 2012-02-07 LAB — COMPREHENSIVE METABOLIC PANEL
ALT: 7 U/L (ref 0–35)
CO2: 30 mEq/L (ref 19–32)
Calcium: 8.9 mg/dL (ref 8.4–10.5)
Creatinine, Ser: 0.77 mg/dL (ref 0.50–1.10)
GFR calc Af Amer: >90 mL/min (ref >90)
GFR calc non Af Amer: 84 mL/min — ABNORMAL LOW (ref >90)
Glucose, Bld: 72 mg/dL (ref 70–99)
Sodium: 133 mEq/L — ABNORMAL LOW (ref 135–145)
Total Protein: 6.6 g/dL (ref 6.0–8.3)

## 2012-02-07 LAB — URINALYSIS, ROUTINE W REFLEX MICROSCOPIC
Nitrite: NEGATIVE
Specific Gravity, Urine: 1.012 (ref 1.005–1.030)
Urobilinogen, UA: 1 mg/dL (ref 0.0–1.0)
pH: 6.5 (ref 5.0–8.0)

## 2012-02-07 MED ORDER — IBUPROFEN 200 MG PO TABS
ORAL_TABLET | ORAL | Status: AC
  Filled 2012-02-07: qty 2

## 2012-02-07 MED ORDER — ACETAMINOPHEN 325 MG PO TABS
650.0000 mg | ORAL_TABLET | Freq: Once | ORAL | Status: DC
  Filled 2012-02-07: qty 2

## 2012-02-07 MED ORDER — IBUPROFEN 200 MG PO TABS
400.0000 mg | ORAL_TABLET | Freq: Once | ORAL | Status: AC
  Administered 2012-02-07: 400 mg via ORAL

## 2012-02-07 MED ORDER — SODIUM CHLORIDE 0.9 % IV BOLUS (SEPSIS)
1000.0000 mL | Freq: Once | INTRAVENOUS | Status: AC
  Administered 2012-02-07: 1000 mL via INTRAVENOUS

## 2012-02-07 NOTE — ED Provider Notes (Signed)
Medical screening examination/treatment/procedure(s) were performed by non-physician practitioner and as supervising physician I was immediately available for consultation/collaboration.   Hanley Seamen, MD 02/07/12 [id number]

## 2012-02-07 NOTE — ED Provider Notes (Signed)
History     CSN: [id number]  Arrival date & time 02/06/12  2236   First MD Initiated Contact with Patient 02/07/12 0048      Chief Complaint  Patient presents with  . Weakness  . Altered Mental Status   level V applies secondary to altered mental status    HPI  History provided by patient's husband. Patient is a 71 year old female with history of hypertension, emphysema, Crohn's disease and chronic anemia who presents with concerns for altered mental status and increased fatigue. Does reports that patient has had recent admissions to the hospital for GI bleeding and increased anemia with blood transfusions. Patient was discharged last week and seemed to be a little it better on Friday and Saturday but has increasing fatigue over the past 2-3 days. Yesterday patient seemed to be having increased confusion as well and not able to do her normal daily activities. patient was forgetting common tasks and had increased sleeping throughout the day. There were no reports of fevers at home. Patient has not had any vomiting, diarrhea or constipation. Patient has not had any cough or signs for shortness of breath. Husband does state that patient was discharged home with oxygen for low oxygen levels while in the hospital recently.    Past Medical History  Diagnosis Date  . Polio   . Osteoporosis     multiple compression fractures  . Crohn's disease     since 1966 on chronic steroids s/p ileal resection, fistula repair  . Brain tumor   . Migraines   . Hypercholesteremia   . Hypertension   . GERD (gastroesophageal reflux disease)   . Anxiety   . Depression   . Emphysema   . Macular degeneration   . Cataract   . Arthritis   . Pernicious anemia   . Primary adrenal deficiency   . COPD (chronic obstructive pulmonary disease)   . Neuropathy, idiopathic   . Anticoagulated on warfarin     Hx of DVT,    Past Surgical History  Procedure Date  . Cholecystectomy   . Cataract repair   .  Ileal resection   . Hemicolectomy     rt due to crohns  . Colonoscopy 08/17/2011    Procedure: COLONOSCOPY;  Surgeon: Freddy Jaksch, MD;  Location: [facility];  Service: Endoscopy;  Laterality: N/A;  . Givens capsule study 09/27/2011    Procedure: GIVENS CAPSULE STUDY;  Surgeon: Freddy Jaksch, MD;  Location: [facility];  Service: Endoscopy;  Laterality: N/A;    Family History  Problem Relation Age of Onset  . Anesthesia problems Neg Hx   . Hypotension Neg Hx   . Malignant hyperthermia Neg Hx   . Pseudochol deficiency Neg Hx     History  Substance Use Topics  . Smoking status: Former Smoker -- 1.0 packs/day    Types: Cigarettes    Quit date: 10/28/2011  . Smokeless tobacco: Never Used  . Alcohol Use: No    OB History    Grav Para Term Preterm Abortions TAB SAB Ect Mult Living                  Review of Systems  Unable to perform ROS: Mental status change  Constitutional: Negative for fever.  Respiratory: Negative for cough and shortness of breath.   Gastrointestinal: Negative for vomiting, abdominal pain, diarrhea and blood in stool.    Allergies  Celebrex; Flagyl; Smz-tmp ds; Ceclor; Ciprofloxacin hcl; Diclofenac sodium; Doxycycline; Hydrocodone; Iohexol; Prilosec; Tylenol; and  Penicillins  Home Medications   Current Outpatient Rx  Name Route Sig Dispense Refill  . ALPRAZOLAM 0.25 MG PO TABS Oral Take 0.25 mg by mouth 3 (three) times daily as needed. For anxiety.    . AMLODIPINE BESYLATE 2.5 MG PO TABS Oral Take 2.5 mg by mouth at bedtime.     Marland Kitchen CALCIUM CARBONATE 600 MG PO TABS Oral Take 600 mg by mouth 2 (two) times daily with a meal.      . DULOXETINE HCL 60 MG PO CPEP Oral Take 60 mg by mouth daily.      . FENTANYL 75 MCG/HR TD PT72 Transdermal Place 1 patch onto the skin every other day.      Marland Kitchen FERROUS SULFATE 325 (65 FE) MG PO TABS Oral Take 1 tablet (325 mg total) by mouth 3 (three) times daily with meals. 90 tablet 3  . GABAPENTIN (PHN) 300 MG PO TABS  Oral Take 1 tablet by mouth daily.      . MOMETASONE FURO-FORMOTEROL FUM 200-5 MCG/ACT IN AERO Inhalation Inhale 2 puffs into the lungs 2 (two) times daily.    Marland Kitchen NADOLOL 20 MG PO TABS Oral Take 20 mg by mouth daily.      Marland Kitchen POTASSIUM CHLORIDE 10 MEQ PO TBCR Oral Take 5-10 mEq by mouth daily. Takes 1 tablet daily Monday through Friday for dosage and takes 1/2 tablet Saturday and Sunday for dosage    . PREDNISONE 5 MG PO TABS Oral Take 5 mg by mouth daily.     Marland Kitchen ALIGN 4 MG PO CAPS Oral Take 1 capsule by mouth daily.    Marland Kitchen PROMETHAZINE HCL 25 MG PO TABS Oral Take 25 mg by mouth every 6 (six) hours as needed. Nausea    . RANITIDINE HCL 300 MG PO CAPS Oral Take 300 mg by mouth 2 (two) times daily.      Marland Kitchen RIVAROXABAN 20 MG PO TABS Oral Take 1 tablet by mouth every evening.    Marland Kitchen SIMVASTATIN 80 MG PO TABS Oral Take 1 tablet (80 mg total) by mouth daily. 30 tablet 0  . SUCRALFATE 1 G PO TABS Oral Take 1 g by mouth 4 (four) times daily.    . TRAMADOL HCL 50 MG PO TABS Oral Take 100 mg by mouth every 8 (eight) hours as needed. Pain.Marland KitchenMarland KitchenMaximum dose= 8 tablets per day     . VITAMIN D (ERGOCALCIFEROL) 50000 UNITS PO CAPS Oral Take 50,000 Units by mouth every 7 (seven) days. Taken on Sundays.      BP 140/79  Pulse 72  Temp 98.5 F (36.9 C) (Oral)  Resp 18  SpO2 96%  Physical Exam  Nursing note and vitals reviewed. Constitutional: She appears well-developed and well-nourished. No distress.  HENT:  Head: Normocephalic.       Mucous membranes dry  Cardiovascular: Normal rate and regular rhythm.   Pulmonary/Chest: Effort normal and breath sounds normal.  Abdominal: Soft. She exhibits no distension. There is no tenderness. There is no rebound and no guarding.  Neurological: She is alert. No cranial nerve deficit.       Strength equal in bilateral extremities. No gross neurologic deficits.  Skin: Skin is warm and dry. No rash noted. She is not diaphoretic. There is pallor.  Psychiatric: She has  a normal mood and affect. Her behavior is normal.    ED Course  Procedures   Results for orders placed during the hospital encounter of 02/06/12  CBC WITH DIFFERENTIAL  Component Value Range   WBC 9.3  4.0 - 10.5 K/uL   RBC 5.32 (*) 3.87 - 5.11 MIL/uL   Hemoglobin 12.8  12.0 - 15.0 g/dL   HCT 16.1  09.6 - 04.5 %   MCV 79.7  78.0 - 100.0 fL   MCH 24.1 (*) 26.0 - 34.0 pg   MCHC 30.2  30.0 - 36.0 g/dL   RDW 40.9 (*) 81.1 - 91.4 %   Platelets 319  150 - 400 K/uL   Neutrophils Relative 69  43 - 77 %   Lymphocytes Relative 15  12 - 46 %   Monocytes Relative 15 (*) 3 - 12 %   Eosinophils Relative 1  0 - 5 %   Basophils Relative 0  0 - 1 %   Neutro Abs 6.4  1.7 - 7.7 K/uL   Lymphs Abs 1.4  0.7 - 4.0 K/uL   Monocytes Absolute 1.4 (*) 0.1 - 1.0 K/uL   Eosinophils Absolute 0.1  0.0 - 0.7 K/uL   Basophils Absolute 0.0  0.0 - 0.1 K/uL   RBC Morphology BASOPHILIC STIPPLING    COMPREHENSIVE METABOLIC PANEL      Component Value Range   Sodium 133 (*) 135 - 145 mEq/L   Potassium 3.6  3.5 - 5.1 mEq/L   Chloride 93 (*) 96 - 112 mEq/L   CO2 30  19 - 32 mEq/L   Glucose, Bld 72  70 - 99 mg/dL   BUN 11  6 - 23 mg/dL   Creatinine, Ser 7.82  0.50 - 1.10 mg/dL   Calcium 8.9  8.4 - 95.6 mg/dL   Total Protein 6.6  6.0 - 8.3 g/dL   Albumin 2.7 (*) 3.5 - 5.2 g/dL   AST 16  0 - 37 U/L   ALT 7  0 - 35 U/L   Alkaline Phosphatase 73  39 - 117 U/L   Total Bilirubin 0.6  0.3 - 1.2 mg/dL   GFR calc non Af Amer 84 (*) >90 mL/min   GFR calc Af Amer >90  >90 mL/min  URINALYSIS, ROUTINE W REFLEX MICROSCOPIC      Component Value Range   Color, Urine YELLOW  YELLOW   APPearance CLOUDY (*) CLEAR   Specific Gravity, Urine 1.012  1.005 - 1.030   pH 6.5  5.0 - 8.0   Glucose, UA NEGATIVE  NEGATIVE mg/dL   Hgb urine dipstick LARGE (*) NEGATIVE   Bilirubin Urine NEGATIVE  NEGATIVE   Ketones, ur TRACE (*) NEGATIVE mg/dL   Protein, ur NEGATIVE  NEGATIVE mg/dL   Urobilinogen, UA 1.0  0.0 - 1.0 mg/dL    Nitrite NEGATIVE  NEGATIVE   Leukocytes, UA SMALL (*) NEGATIVE  OCCULT BLOOD, POC DEVICE      Component Value Range   Fecal Occult Bld NEGATIVE    URINE MICROSCOPIC-ADD ON      Component Value Range   WBC, UA 3-6  <3 WBC/hpf   RBC / HPF TOO NUMEROUS TO COUNT  <3 RBC/hpf   Bacteria, UA MANY (*) RARE       Dg Chest 2 View  02/07/2012  *RADIOLOGY REPORT*  Clinical Data: 71 year old female with altered mental status, weakness, shortness of breath.  CHEST - 2 VIEW  Comparison: 01/30/2012 and earlier.  Findings: Semi upright AP and lateral views of the chest. Chronically large lung volumes and increased AP dimension to the chest.  Pleural effusions are less apparent at this may be related to positioning.  Multilevel thoracic  compression fractures re- identified.  The no definite confluent opacity.  Increased interstitial markings and possibly superimposed vascular congestion are not significantly changed.  Stable cardiac size and mediastinal contours.  IMPRESSION: Stable since 01/29/2012 allowing for differences in technique.  No new cardiopulmonary abnormality.  Original Report Authenticated By: Harley Hallmark, M.D.   Ct Head Wo Contrast  02/07/2012  *RADIOLOGY REPORT*  Clinical Data: Weakness and confusion.  CT HEAD WITHOUT CONTRAST  Technique:  Contiguous axial images were obtained from the base of the skull through the vertex without contrast.  Comparison: MRI brain 05/25/2010  Findings: Diffuse cerebral atrophy.  Low attenuation changes in the deep white matter consistent with small vessel ischemia.  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  No mass effect or midline shift.  No abnormal extra- axial fluid collections.  Gray-white matter junctions are distinct. Basal cisterns are not effaced.  No evidence of acute intracranial hemorrhage.  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   No significant change since previous study.  The lesion measures about 1.2 x 1.1 cm.  Vascular calcifications.  No depressed skull fractures.  Visualized paranasal sinuses and mastoid air cells are not opacified.  IMPRESSION: No acute intracranial abnormalities.  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 Stable appearance of right cerebellopontine angle lesion causing bony expansion in the internal auditory canal consistent with known schwannoma.  Original Report Authenticated By: Marlon Pel, M.D.     1. Fatigue   2. Weakness   3. Fever       MDM  Patient seen and evaluated. Patient in no acute distress. Patient appears sick and dehydrated.  Patient is to be stable. Labs do not show any significant findings. H&H stable today. Renal function normal. Urine without substantial signs for UTI. Chest x-ray normal without signs for pneumonia. Respirations normal O2 sats have remained stable.  Spoke with Dr. [name] about patient. We discussed lab testing and results including urine results. Urine has been sent for culture. At this time no clear signs for patient's fever and generalized fatigue and weakness. He does not feel patient requires admission and has appointment this afternoon at 2:30 with patient. At that time he will plan to reassess patient and plan for possible home health or other treatments for rehabilitation.  Have discussed lab test findings with patient and husband. Have also discussed plans to as discussed with Dr. [name]. They are in agreement with this at this time and we'll plan to followup with the appointment later today.    Angus Seller, [place] 02/07/12 [phone]

## 2012-02-07 NOTE — Discharge Instructions (Signed)
You were seen and evaluated for your concerns of altered mental status and weakness. Your lab testing, urine tests, chest x-ray and CAT scan of your head and brain did not show any concerning findings today to explain your symptoms. At this time your providers feel you may return home and followup with your appointment with Dr. [name] later today. If you develop any worsening symptoms please return to the emergency room.    Fatigue Fatigue is a feeling of tiredness, lack of energy, lack of motivation, or feeling tired all the time. Having enough rest, good nutrition, and reducing stress will normally reduce fatigue. Consult your caregiver if it persists. The nature of your fatigue will help your caregiver to find out its cause. The treatment is based on the cause.  CAUSES  There are many causes for fatigue. Most of the time, fatigue can be traced to one or more of your habits or routines. Most causes fit into one or more of three general areas. They are: Lifestyle problems  Sleep disturbances.   Overwork.   Physical exertion.   Unhealthy habits.   Poor eating habits or eating disorders.   Alcohol and/or drug use .   Lack of proper nutrition (malnutrition).  Psychological problems  Stress and/or anxiety problems.   Depression.   Grief.   Boredom.  Medical Problems or Conditions  Anemia.   Pregnancy.   Thyroid gland problems.   Recovery from major surgery.   Continuous pain.   Emphysema or asthma that is not well controlled   Allergic conditions.   Diabetes.   Infections (such as mononucleosis).   Obesity.   Sleep disorders, such as sleep apnea.   Heart failure or other heart-related problems.   Cancer.   Kidney disease.   Liver disease.   Effects of certain medicines such as antihistamines, cough and cold remedies, prescription pain medicines, heart and blood pressure medicines, drugs used for treatment of cancer, and some antidepressants.  SYMPTOMS    The symptoms of fatigue include:   Lack of energy.   Lack of drive (motivation).   Drowsiness.   Feeling of indifference to the surroundings.  DIAGNOSIS  The details of how you feel help guide your caregiver in finding out what is causing the fatigue. You will be asked about your present and past health condition. It is important to review all medicines that you take, including prescription and non-prescription items. A thorough exam will be done. You will be questioned about your feelings, habits, and normal lifestyle. Your caregiver may suggest blood tests, urine tests, or other tests to look for common medical causes of fatigue.  TREATMENT  Fatigue is treated by correcting the underlying cause. For example, if you have continuous pain or depression, treating these causes will improve how you feel. Similarly, adjusting the dose of certain medicines will help in reducing fatigue.  HOME CARE INSTRUCTIONS   Try to get the required amount of good sleep every night.   Eat a healthy and nutritious diet, and drink enough water throughout the day.   Practice ways of relaxing (including yoga or meditation).   Exercise regularly.   Make plans to change situations that cause stress. Act on those plans so that stresses decrease over time. Keep your work and personal routine reasonable.   Avoid street drugs and minimize use of alcohol.   Start taking a daily multivitamin after consulting your caregiver.  SEEK MEDICAL CARE IF:   You have persistent tiredness, which cannot be accounted for.  You have fever.   You have unintentional weight loss.   You have headaches.   You have disturbed sleep throughout the night.   You are feeling sad.   You have constipation.   You have dry skin.   You have gained weight.   You are taking any new or different medicines that you suspect are causing fatigue.   You are unable to sleep at night.   You develop any unusual swelling of your legs  or other parts of your body.  SEEK IMMEDIATE MEDICAL CARE IF:   You are feeling confused.   Your vision is blurred.   You feel faint or pass out.   You develop severe headache.   You develop severe abdominal, pelvic, or back pain.   You develop chest pain, shortness of breath, or an irregular or fast heartbeat.   You are unable to pass a normal amount of urine.   You develop abnormal bleeding such as bleeding from the rectum or you vomit blood.   You have thoughts about harming yourself or committing suicide.   You are worried that you might harm someone else.  MAKE SURE YOU:   Understand these instructions.   Will watch your condition.   Will get help right away if you are not doing well or get worse.  Document Released: 05/27/2007 Document Revised: 07/19/2011 Document Reviewed: 05/27/2007 [facility] Patient Information 2012 [place], [place].     Weakness Weakness can be caused by many things. Your doctor has checked for the most common causes. HOME CARE  Rest.   Eat a well-balanced diet.   Exercise every day.   Go to follow-up appointments.  GET HELP RIGHT AWAY IF:   There is chest pain or chest pressure.   You have problems breathing.   You cannot do usual daily activities.   You have problems speaking or swallowing.   You have a very bad headache or belly (abdominal) pain.   The heartbeat does not feel normal or the pulse is very fast.   You are confused.   You have vision problems or problems walking.   You have chills.   You or your child has a temperature by mouth above 102 F (38.9 C), not controlled by medicine.   Your baby is older than 3 months with a rectal temperature of 102 F (38.9 C) or higher.   Your baby is 26 months old or younger with a rectal temperature of 100.4 F (38 C) or higher.  MAKE SURE YOU:   Understand these instructions.   Will watch this condition.   Will get help right away if you are not doing well or get  worse.  Document Released: 07/12/2008 Document Revised: 07/19/2011 Document Reviewed: 07/12/2008 [facility] Patient Information 2012 [place], [place].

## 2012-02-09 LAB — URINE CULTURE

## 2012-02-10 NOTE — ED Notes (Signed)
Results received from [facility]. (+) URNC -> >/= 100,000 colonies Klebsiella Pneumoniae.  No Abx Rx given in ED.  Chart to MD office for review.

## 2012-02-14 NOTE — ED Notes (Signed)
Patient is being treated by Dr [name]. Copy of labs faxed to Dr. Rinaldo Cloud office

## 2012-07-18 ENCOUNTER — Encounter: Payer: Self-pay | Admitting: Pulmonary Disease

## 2012-07-18 ENCOUNTER — Ambulatory Visit (INDEPENDENT_AMBULATORY_CARE_PROVIDER_SITE_OTHER): Payer: Medicare Other | Admitting: Pulmonary Disease

## 2012-07-18 VITALS — BP 122/72 | HR 64 | Temp 97.9°F | Ht 63.0 in | Wt 128.6 lb

## 2012-07-18 DIAGNOSIS — R918 Other nonspecific abnormal finding of lung field: Secondary | ICD-10-CM

## 2012-07-18 NOTE — Assessment & Plan Note (Signed)
The patient has had a recent CT chest that shows resolution of her prior left lower lobe abnormality, but now new infiltrates in her right lower lobe and left upper lobe.  Her CT scans are not available for my review, and I have asked the patient to bring her disc from home with the scans.  She really does not have any history to suggest bacterial pneumonia, and I wonder if these are simply inflammatory.  The other possibility is whether she may have some chronic atypical infection since she has been on chronic prednisone.  I would like to review her scans, and then make a decision about future treatment.  She is on oxygen at night, but has not had her levels rechecked since being out of the hospital.  Will therefore check overnight oximetry on room air.

## 2012-07-18 NOTE — Patient Instructions (Addendum)
Please bring disk with xrays to the office for my review.  Will call you once I have looked at them.

## 2012-07-18 NOTE — Progress Notes (Signed)
Subjective:    Patient ID: Katherine Mckay, female    DOB: Mar 13, 1941, 71 y.o.   MRN: [id number]  HPI The patient is a 71 year old female who I've been asked to see for an abnormal CT chest.  She had a CT of her abdomen in May of this year where an abnormal density was noted at the left base.  She then had a CT of her chest which showed a nodular airspace opacity at her left base, along with a tiny 3 mm nodule in the right middle lobe and emphysematous changes in the upper lobes.  The patient had no significant symptoms at that time of pneumonia.  She had a followup scan last month which showed her prior process had resolved.  However, she was noted to have a new infiltrate in the right lower lobe and left upper lobe.  She had no mediastinal lymphadenopathy.  The patient has minimal cough with only occasional mucus production the size of a thumbnail.  It is discolored, and occurs a few times a day.  She has no congestion or other symptoms of infection.  She has had this pattern since July of this year, and hasn't been an issue for her in the past.  She did have one streak of hemoptysis last month, but none since.  She denies any chest discomfort.  She has not lost weight, and has an increased appetite since stopping smoking.  She does have new hoarseness since June of this year.  She has occasional issues with choking and coughing while eating, but it is not frequent.  Apparently, she has had a barium swallow in the past.  She has been treated with a course of Avelox for her abnormal CT, but feels that she is no different after taking them.  It should be noted the patient is on chronic prednisone for her Crohn's disease.  She also has had actual improvement in her breathing over the last 6 mos.    Review of Systems  Constitutional: Positive for unexpected weight change ( gain). Negative for fever.  HENT: Positive for congestion, rhinorrhea, trouble swallowing, voice change ( raspy voice) and postnasal drip.  Negative for ear pain, nosebleeds, sore throat, sneezing, dental problem and sinus pressure.   Eyes: Negative for redness and itching.  Respiratory: Positive for cough and shortness of breath. Negative for chest tightness and wheezing.   Cardiovascular: Negative for palpitations and leg swelling.  Gastrointestinal: Positive for nausea. Negative for vomiting.  Genitourinary: Negative for dysuria.       Incontinence  Musculoskeletal: Negative for joint swelling.  Skin: Negative for rash.  Neurological: Positive for headaches ( neck problems...nerve damage).  Hematological: Does not bruise/bleed easily.  Psychiatric/Behavioral: Negative for dysphoric mood. The patient is not nervous/anxious.        Objective:   Physical Exam Constitutional:  Frail appearing female, no acute distress  HENT:  Nares patent without discharge  Oropharynx without exudate, palate and uvula are normal  Eyes:  Perrla, eomi, no scleral icterus  Neck:  No JVD, no TMG  Cardiovascular:  Normal rate, regular rhythm, no rubs or gallops.  No murmurs        Intact distal pulses  Pulmonary :  Normal breath sounds, no stridor or respiratory distress   No rales or wheezing, but mild rhonchi noted.  Abdominal:  Soft, nondistended, bowel sounds present.  No tenderness noted.   Musculoskeletal:  1+ lower extremity edema noted, ++ varicosities  Lymph Nodes:  No cervical lymphadenopathy  noted  Skin:  No cyanosis noted  Neurologic:  Alert, appropriate, moves all 4 extremities without obvious deficit.         Assessment & Plan:

## 2012-07-21 ENCOUNTER — Telehealth: Payer: Self-pay | Admitting: Pulmonary Disease

## 2012-07-21 ENCOUNTER — Encounter: Payer: Self-pay | Admitting: Pulmonary Disease

## 2012-07-21 DIAGNOSIS — R0600 Dyspnea, unspecified: Secondary | ICD-10-CM

## 2012-07-21 NOTE — Telephone Encounter (Signed)
Patient dropped off CT/cxr scans from [facility] that she forgot to bring to [facility] 07/18/12.  Disc placed in review folder.   Message routed to [facility] as Lorain Childes

## 2012-07-28 NOTE — Telephone Encounter (Signed)
Pt's most recent ct chest shows minimal scarring/inflammatory change in lingula/LLL, but does show a new nodular infiltrate in anterior segment RLL.  Unclear whether all of this is inflammatory, ?MAC since she is on chronic prednisone, or whether she may be aspirating?  Because the pt is really not having a lot of pulmonary symptoms, would like to see how she does clinically over the next 3 mos.  I have told the husband to get a f/u apptm with me in 3mos, and we may do a f/u ct chest.    I need the ONO done on this pt last week on room air.  Have advanced fax to triage and bring to me.  Thanks.

## 2012-07-29 ENCOUNTER — Telehealth: Payer: Self-pay | Admitting: Pulmonary Disease

## 2012-07-29 NOTE — Telephone Encounter (Signed)
Made in error. Emily E McAlister  °

## 2012-07-29 NOTE — Telephone Encounter (Signed)
Called [facility] for ONO results. Results have been received and placed in [facility] result folder.

## 2012-07-29 NOTE — Telephone Encounter (Signed)
Please let pt know that her overnight study shows that she does not require the oxygen.  Please send an order to [facility] to d/c oxygen.  Thanks.

## 2012-07-30 NOTE — Telephone Encounter (Signed)
Spoke with the pt's spouse. Pt was asleep. Spouse will let the pt know that her ONO showed she does not need oxygen and we will send an order to [facility] to have them come pick up her concentrator.

## 2012-08-21 ENCOUNTER — Encounter: Payer: Self-pay | Admitting: Pulmonary Disease

## 2012-10-28 ENCOUNTER — Ambulatory Visit: Payer: Medicare Other | Admitting: Pulmonary Disease

## 2012-11-07 ENCOUNTER — Ambulatory Visit (INDEPENDENT_AMBULATORY_CARE_PROVIDER_SITE_OTHER): Payer: Medicare Other | Admitting: Pulmonary Disease

## 2012-11-07 ENCOUNTER — Encounter: Payer: Self-pay | Admitting: Pulmonary Disease

## 2012-11-07 VITALS — BP 132/84 | HR 67 | Temp 97.1°F | Ht 63.0 in | Wt 136.0 lb

## 2012-11-07 DIAGNOSIS — R0600 Dyspnea, unspecified: Secondary | ICD-10-CM

## 2012-11-07 DIAGNOSIS — R918 Other nonspecific abnormal finding of lung field: Secondary | ICD-10-CM

## 2012-11-07 DIAGNOSIS — R0989 Other specified symptoms and signs involving the circulatory and respiratory systems: Secondary | ICD-10-CM

## 2012-11-07 DIAGNOSIS — J479 Bronchiectasis, uncomplicated: Secondary | ICD-10-CM | POA: Insufficient documentation

## 2012-11-07 NOTE — Patient Instructions (Addendum)
Will send sputum culture.  Bring within 2-3 hours after producing, and don't do this until at least Monday next week. Will schedule for breathing studies within next 3 weeks.  Will arrange scan of your chest after treating with antibiotics (after getting culture) Will arrange followup once all of this is done.  Please call if you feel your symptoms are worsening.

## 2012-11-07 NOTE — Assessment & Plan Note (Signed)
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  However, she also has a long history of smoking, and is having worsening symptoms, and therefore I would like to schedule her for a followup for evaluation.  I would like to wait until she is been treated with a course of antibiotics, and this in turn will have to wait on her sputum culture.  Hopefully we will be able to get the CT in the next 4 weeks.

## 2012-11-07 NOTE — Assessment & Plan Note (Signed)
The patient feels that her shortness of breath is worsening, and she has multiple reasons to have this.  She has known cardiac disease, has a long history of smoking and therefore may have COPD, and finally she has significant chest wall deformity with scoliosis that may result in restrictive physiology.  I think she needs to have full PFTs for evaluation.

## 2012-11-07 NOTE — Assessment & Plan Note (Signed)
The patient has underlying bronchiectasis on her CT chest, and I am wondering if her current symptoms of increased mucus is related to an acute exacerbation or possibly from MAC.  I would like to do a sputum culture sent she is not acutely ill for both AFB and routine organisms, prior to considering antibiotics.  The patient is agreeable to this approach, but will call me if she has worsening symptoms prior to that.

## 2012-11-07 NOTE — Progress Notes (Signed)
  Subjective:    Patient ID: Katherine Mckay, female    DOB: Dec 05, 1940, 72 y.o.   MRN: [id number]  HPI The patient comes in today for followup of her known abnormal CT chest.  She is felt to have some bronchiectasis, and her last CT showed abnormalities that were most consistent with scarring and possibly inflammatory changes from Mac?the patient at that time was really asymptomatic from a pulmonary standpoint, and the decision was made to do a followup CT in 3-4 months and see how she did.  She comes in today where she has had an increase in her cough, and it is productive of purulent-appearing mucus the size of the chicklet about 4 times a day.  She has had some increased chest congestion, but no fevers, chills, or sweats.  She has not had any worsening sinus symptoms.  Her new symptom is that of some increased shortness of breath with exertional activities only.  She has known underlying cardiac disease, and also has scoliosis as well as a long smoking history.  She has never had pulmonary function studies.  She is also complaining of hoarseness which is new for her since her hospitalization last year.  She has been seen by otolaryngology in the past, but not recently.  She will obviously need an upper airway exam.   Review of Systems  Constitutional: Positive for appetite change ( pt reports that she does not want normal food---sweets are all she wants--normal healthy foods nauseat her), fatigue and unexpected weight change ( d/t eating habits being changed). Negative for fever.  HENT: Positive for rhinorrhea and voice change ( since 02/2012--worsening). Negative for ear pain, nosebleeds, congestion, sore throat, sneezing, trouble swallowing, dental problem, postnasal drip and sinus pressure.   Eyes: Negative for redness and itching.  Respiratory: Positive for cough and shortness of breath. Negative for chest tightness and wheezing.   Cardiovascular: Negative for palpitations and leg swelling.   Gastrointestinal: Negative for nausea and vomiting.  Genitourinary: Negative for dysuria.       Over-active//over-stimulated bladder  Musculoskeletal: Negative for joint swelling.  Skin: Negative for rash.  Neurological: Negative for headaches.  Hematological: Does not bruise/bleed easily.  Psychiatric/Behavioral: Negative for dysphoric mood. The patient is not nervous/anxious.        Objective:   Physical Exam Well-appearing female in no acute distress Nose without purulent discharge noted Oropharynx clear Neck without lymphadenopathy or thyromegaly Chest with a few basilar crackles, but otherwise totally clear.  Significant scoliosis noted Cardiac exam is regular rate and rhythm Lower extremities with mild ankle edema, no cyanosis Alert and oriented, moves all 4 extremities.       Assessment & Plan:

## 2012-11-10 ENCOUNTER — Other Ambulatory Visit: Payer: Medicare Other

## 2012-11-10 DIAGNOSIS — J479 Bronchiectasis, uncomplicated: Secondary | ICD-10-CM

## 2012-11-13 LAB — RESPIRATORY CULTURE OR RESPIRATORY AND SPUTUM CULTURE

## 2012-11-17 ENCOUNTER — Other Ambulatory Visit: Payer: Self-pay | Admitting: Pulmonary Disease

## 2012-11-17 DIAGNOSIS — R918 Other nonspecific abnormal finding of lung field: Secondary | ICD-10-CM

## 2012-11-17 MED ORDER — CIPROFLOXACIN HCL 750 MG PO TABS: 750.0000 mg | ORAL_TABLET | Freq: Two times a day (BID) | ORAL | Status: DC

## 2012-11-21 ENCOUNTER — Other Ambulatory Visit (HOSPITAL_COMMUNITY): Payer: Self-pay | Admitting: Otolaryngology

## 2012-11-21 DIAGNOSIS — R1310 Dysphagia, unspecified: Secondary | ICD-10-CM

## 2012-11-24 ENCOUNTER — Ambulatory Visit (HOSPITAL_COMMUNITY)
Admission: RE | Admit: 2012-11-24 | Discharge: 2012-11-24 | Disposition: A | Discharge status: Home / Self Care | Payer: Medicare Other | Source: Ambulatory Visit | Attending: Otolaryngology | Admitting: Otolaryngology

## 2012-11-24 DIAGNOSIS — R1310 Dysphagia, unspecified: Secondary | ICD-10-CM

## 2012-11-24 NOTE — Procedures (Addendum)
Objective Swallowing Evaluation: Modified Barium Swallowing Study  Patient Details  Name: Katherine Mckay MRN: [id number] Date of Birth: [birth year]  Today's Date: 11/24/2012 Time: 9518-8416 SLP Time Calculation (min): 49 min  Past Medical History:  Past Medical History  Diagnosis Date  . Polio   . Osteoporosis     multiple compression fractures  . Crohn's disease     since 1966 on chronic steroids s/p ileal resection, fistula repair  . Brain tumor   . Migraines   . Hypercholesteremia   . Hypertension   . GERD (gastroesophageal reflux disease)   . Anxiety   . Depression   . Emphysema   . Macular degeneration   . Cataract   . Arthritis   . Pernicious anemia   . Primary adrenal deficiency   . COPD (chronic obstructive pulmonary disease)   . Neuropathy, idiopathic   . Anticoagulated on warfarin     Hx of DVT, PE   Past Surgical History:  Past Surgical History  Procedure Laterality Date  . Cholecystectomy    . Cataract repair    . Ileal resection    . Hemicolectomy      rt due to crohns  . Colonoscopy  08/17/2011    Procedure: COLONOSCOPY;  Surgeon: Freddy Jaksch, MD;  Location: [facility];  Service: Endoscopy;  Laterality: N/A;  . Givens capsule study  09/27/2011    Procedure: GIVENS CAPSULE STUDY;  Surgeon: Freddy Jaksch, MD;  Location: [facility];  Service: Endoscopy;  Laterality: N/A;   HPI:  72 yo female referred by Dr Jenne Pane for MBS due to concerns pt may be aspirating.  Pt reports problems with vocal hoarseness since last summer (June 2013) that is continuing to worsen.   She reports changes in medications to determine if helpful with voice.  Pt is taking Cipro currently for pulmonary infection per spouse.   Pt reports that last year she had 3 hospitalizations for bleeding.   Per interview, PMH + for COPD, h/o smoking (quit March 2013 after 50 years of smoking),  postpolio, degenerative cervical spine issues, depression, Crohn's disease with vomiting as  complication, arthritis, hiatal hernia, acid reflux.  Pt underwent an esophagram 08/02/2008 with findings of intermittent spasm of inferior constrictors in the cervical esophagus, flash penetration of liquid - otherwise normal exam; No hiatal hernia or stricture or Zenker's diverticulum.  Pt also reports undergoing GI testing via camera pill last summer, which she stated was negative.  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   MRI spine 05/2008 showed C5-C6 spondylosis, right sided disc osteophyte.    Given pt's vocal fold paresis, concern is present that she may be aspirating.   Pt states she primarily eats cake as that is easy for her to swallow.  She states drinking water makes her throat feel drier at times and she gets full quickly.  She eats standing up, SLP suspects this helps pt due to restrictive physiology from her scoliosis and COPD possibly negatively impacting esophageal clearance.    Pt also admits to getting "full" quickly, frequent belching noted during today's testing  - which pt states is baseline.     Assessment / Plan / Recommendation Clinical Impression  Dysphagia Diagnosis: Within Functional Limits;Suspected primary esophageal dysphagia;Mild cervical esophageal phase dysphagia (prominent CP, ? mildly decr UES relaxation)   Clinical impression: Pt presents with functional oropharyngeal swallow, suspect mild cervical esophageal dysphagia and  possible primary esophageal deficits.  Pt did not aspirate or penetrate any consistency tested and pharyngeal swallow was strong without residuals.    Although CP appeared prominent, it did not impair barium flow significantly.  Piecemeal swallowing noted which was functional for this pt.      Reflux Symptom Index was given to pt with score being 37 of 45, indicating high possiblity of LPR based on authors of screening.   Advised  pt to re-score herself in one month for improvement.  Pt was educated to aspiration precautions given her COPD, but do not suspect oropharyngeal swallow function contributing significantly to aspiration risk.  Xerostomia also reported for which pt states she uses Biotene on occasion.    As findings appear consistent with esophageal deficits and unfortunately WITHOUT pt sensation, recommend consider dedicated esophageal evaluation.    Provided mitigation tips with SLP contact number to pt and spouse.  Thanks for this referral.      Treatment Recommendation  No treatment recommended at this time    Diet Recommendation Dysphagia 3 (Mechanical Soft);Regular;Thin liquid (ground meats, extra gravies/sauces)   Liquid Administration via: Cup;Straw Medication Administration: Whole meds with liquid (start with H20, take with pudding,follow/H20 if problematic) Supervision: Patient able to self feed Compensations: Slow rate;Small sips/bites Postural Changes and/or Swallow Maneuvers: Seated upright 90 degrees;Upright 30-60 min after meal (eat several small meals, drink ROOM TEMP or WARM liquids)    Other  Recommendations Recommended Consults: Consider esophageal assessment Oral Care Recommendations: Oral care before and after PO   Follow Up Recommendations  None           SLP Swallow Goals  n/a eval educate and dc   General Date of Onset: 11/24/12 HPI: 73 yo female referred by Dr Jenne Pane for MBS due to concerns pt may be aspirating.  Pt reports problems with vocal hoarseness since last summer (June 2013) that is continuing to worsen.  Pt reports that last year she had 3 hospitalizations for bleeding.   Per interview, PMH + for COPD, h/o smoking (quit March 2013 after 50 years of smoking),  postpolio, degenerative cervical spine issues, depression, Crohn's disease with vomiting as complication, arthritis, hiatal hernia, acid reflux.  Pt underwent an esophagram 08/02/2008 with findings of intermittent  spasm of inferior constrictors in the cervical esophagus, flash penetration of liquid - otherwise normal exam; No hiatal hernia or stricture or Zenker's diverticulum.  Pt also underwent GI testing via pill last summer, which she stated was negative.  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   Pt is taking Cipro currently for pulmonary infection.  MRI spine 05/2008 showed C5-C6 spondylosis, right sided disc osteophyte.  Given pt's vocal fold paresis, concern is present that she may be aspirating.   Type of Study: Modified Barium Swallowing Study Reason for Referral: Objectively evaluate swallowing function;Other (comment) (concern for aspiration) Diet Prior to this Study: Regular;Thin liquids (pt states she "eats cake") Temperature Spikes Noted: No Respiratory Status: Room air History of Recent Intubation: No (pt denies intubations in the last year) Behavior/Cognition: Alert;Cooperative;Pleasant mood Oral Cavity - Dentition: Dentures, top;Dentures, bottom Oral Motor / Sensory Function: Within functional limits (? right labial asymmetry,  pt states this is baseline) Self-Feeding Abilities: Able to feed self Patient Positioning: Upright in chair Baseline Vocal Quality: Hoarse Volitional Cough: Strong (productive, pt states she coughs up mucus Chicklete sized) Volitional Swallow: Able to elicit Anatomy: Within functional limits  Pharyngeal Secretions: Not observed secondary MBS    Reason for Referral Objectively evaluate swallowing function;Other (comment) (concern for aspiration)   Oral Phase Oral Preparation/Oral Phase Oral Phase: WFL Oral - Nectar Oral - Nectar Cup: Piecemeal swallowing Oral - Thin Oral - Thin Cup: Piecemeal swallowing Oral - Thin Straw: Piecemeal swallowing Oral - Solids Oral - Puree: Piecemeal swallowing Oral - Regular: Within functional limits Oral -  Pill: Within functional limits   Pharyngeal Phase Pharyngeal Phase Pharyngeal Phase: Within functional limits Pharyngeal - Nectar Pharyngeal - Nectar Cup: Premature spillage to valleculae Pharyngeal - Thin Pharyngeal - Thin Cup: Premature spillage to valleculae Pharyngeal - Thin Straw: Premature spillage to valleculae Pharyngeal - Solids Pharyngeal - Puree: Premature spillage to valleculae;Within functional limits Pharyngeal - Regular: Within functional limits;Premature spillage to valleculae Pharyngeal - Pill: Within functional limits  Cervical Esophageal Phase    GO    Cervical Esophageal Phase Cervical Esophageal Phase: Impaired Cervical Esophageal Phase - Nectar Nectar Cup: Prominent cricopharyngeal segment;Reduced cricopharyngeal relaxation Cervical Esophageal Phase - Thin Thin Cup: Prominent cricopharyngeal segment;Reduced cricopharyngeal relaxation Thin Straw: Reduced cricopharyngeal relaxation;Prominent cricopharyngeal segment Cervical Esophageal Phase - Solids Puree: Prominent cricopharyngeal segment;Reduced cricopharyngeal relaxation Regular: Prominent cricopharyngeal segment;Reduced cricopharyngeal relaxation Pill: Prominent cricopharyngeal segment;Reduced cricopharyngeal relaxation Cervical Esophageal Phase - Comment Cervical Esophageal Comment: Appearance of prominent cricopharyngeus with trace amount of stasis at UES with liquids.  Appearance of delayed clearance at distal esophagus across consistencies without pt awareness/sensation with widening of esophagus above LES - ? consistent with spasms/motility issues.  Consuming water during testing appeared to facilitate clearance but with retrograde propulsion.  Barium tablet appeared to lodge at GE without pt sensation - Pt states she normally eats a cracker to clear her pills.  Suspect primary source of vocal hoarseness and dysphagia symptoms are consistent with esophageal deficits.  Radiologist not present to  confirm.    Functional Assessment Tool Used: clinical judgement Functional Limitations: Swallowing Swallow Current Status ([id number]): At least 40 percent but less than 60 percent impaired, limited or restricted Swallow Goal Status [phone]): At least 40 percent but less than 60 percent impaired, limited or restricted Swallow Discharge Status [phone]): At least 40 percent but less than 60 percent impaired, limited or restricted    Donavan Burnet, MS [facility] SLP [phone]

## 2012-11-25 ENCOUNTER — Ambulatory Visit (HOSPITAL_COMMUNITY): Payer: Medicare Other

## 2012-12-08 ENCOUNTER — Ambulatory Visit (INDEPENDENT_AMBULATORY_CARE_PROVIDER_SITE_OTHER)
Admission: RE | Admit: 2012-12-08 | Discharge: 2012-12-08 | Disposition: A | Discharge status: Home / Self Care | Payer: Medicare Other | Source: Ambulatory Visit | Attending: Pulmonary Disease | Admitting: Pulmonary Disease

## 2012-12-08 DIAGNOSIS — R918 Other nonspecific abnormal finding of lung field: Secondary | ICD-10-CM

## 2012-12-09 ENCOUNTER — Ambulatory Visit (INDEPENDENT_AMBULATORY_CARE_PROVIDER_SITE_OTHER): Payer: Medicare Other | Admitting: Pulmonary Disease

## 2012-12-09 ENCOUNTER — Encounter: Payer: Self-pay | Admitting: Pulmonary Disease

## 2012-12-09 VITALS — BP 128/80 | HR 64 | Temp 97.3°F | Ht 62.0 in | Wt 137.0 lb

## 2012-12-09 DIAGNOSIS — R0989 Other specified symptoms and signs involving the circulatory and respiratory systems: Secondary | ICD-10-CM

## 2012-12-09 DIAGNOSIS — J438 Other emphysema: Secondary | ICD-10-CM

## 2012-12-09 DIAGNOSIS — J479 Bronchiectasis, uncomplicated: Secondary | ICD-10-CM

## 2012-12-09 DIAGNOSIS — J439 Emphysema, unspecified: Secondary | ICD-10-CM

## 2012-12-09 DIAGNOSIS — R0600 Dyspnea, unspecified: Secondary | ICD-10-CM

## 2012-12-09 LAB — PULMONARY FUNCTION TEST

## 2012-12-09 MED ORDER — MOMETASONE FURO-FORMOTEROL FUM 100-5 MCG/ACT IN AERO: 2.0000 | INHALATION_SPRAY | Freq: Two times a day (BID) | RESPIRATORY_TRACT | Status: DC

## 2012-12-09 NOTE — Progress Notes (Signed)
PFT done today. 

## 2012-12-09 NOTE — Progress Notes (Signed)
  Subjective:    Patient ID: Katherine Mckay, female    DOB: 1941-05-04, 72 y.o.   MRN: [id number]  HPI Patient comes in today for followup of her recent CT chest and PFTs.  Her CT chest showed mild scattered bronchiectasis, and also a few associated areas of groundglass opacity that are minimal in nature.  All of her small nodules are completely stable.  Her PFTs showed severe airflow obstruction, significant air trapping, and a severe reduction in diffusion capacity.  I have reviewed all of the studies with her and her husband, and answered all of their questions.  It should also be noted that her sputum culture recently returned with Pseudomonas that was pansensitive.  She has been treated with a course of prednisone with significant improvement in her symptoms.   Review of Systems  Constitutional: Positive for activity change, appetite change and fatigue. Negative for fever and unexpected weight change.  HENT: Negative for ear pain, nosebleeds, congestion, sore throat, rhinorrhea, sneezing, trouble swallowing, dental problem, postnasal drip and sinus pressure.   Eyes: Negative for redness and itching.  Respiratory: Positive for cough and shortness of breath. Negative for chest tightness and wheezing.   Cardiovascular: Negative for palpitations and leg swelling.  Gastrointestinal: Negative for nausea and vomiting.  Genitourinary: Negative for dysuria.  Musculoskeletal: Negative for joint swelling.  Skin: Negative for rash.  Neurological: Negative for headaches.  Hematological: Does not bruise/bleed easily.  Psychiatric/Behavioral: Negative for dysphoric mood. The patient is not nervous/anxious.        Objective:   Physical Exam Frail-appearing female in no acute distress Nose without purulence or discharge noted Neck without lymphadenopathy or thyromegaly Chest with a few scattered crackles, no wheezing Cardiac exam with regular rate and rhythm Lower extremities with mild edema, no  cyanosis Alert and oriented, moves all 4 extremities.       Assessment & Plan:

## 2012-12-09 NOTE — Assessment & Plan Note (Signed)
The patient has severe air flow obstruction by her recent pulmonary function studies, and therefore I would like to intensify her bronchodilator regimen.  She is on high dose dulera, and I would like to decrease this to the 100/5 strength.  I would also like to add Spiriva to see if she has improvement in her breathing.  I have suggested that she attend pulmonary rehabilitation, however the patient does not feel that she can do it with her musculoskeletal issues currently.  She will keep this in mind.

## 2012-12-09 NOTE — Patient Instructions (Addendum)
Decrease dulera to the 100/5 strength, and use 2 puffs am and pm each day no matter what.  Keep mouth rinsed. Trial of spiriva one inhalation each am for next 4 weeks.  Call and let me know if helps, and we can send in prescription.  Keep in mind the red flags we discussed, regarding the need for antibiotics. Try and stay as active as possible. Would like to see you back in 4mos.

## 2012-12-09 NOTE — Assessment & Plan Note (Signed)
The patient has scattered areas of bronchiectasis on her CT chest, and is growing a pan sensitive pseudomonas in her sputum.  She has been treated with a course of ciprofloxacin, with great improvement in her symptoms overall.  This culture will need to be kept in mind any time she develops a pulmonary infection.  I had a long discussion with her and her husband about bronchiectasis, including the typical management.  They understand that we can never sterilize her secretions, and attempts at doing so will only result in multi-drug-resistant pathogens.  I have outlined various red flags for them to call me, and they have voiced understanding.

## 2012-12-23 LAB — AFB CULTURE WITH SMEAR (NOT AT ARMC): Acid Fast Smear: NONE SEEN

## 2012-12-25 ENCOUNTER — Encounter: Payer: Self-pay | Admitting: Pulmonary Disease

## 2012-12-27 ENCOUNTER — Encounter (HOSPITAL_COMMUNITY): Payer: Self-pay | Admitting: *Deleted

## 2012-12-27 ENCOUNTER — Emergency Department (HOSPITAL_COMMUNITY): Payer: Medicare Other

## 2012-12-27 ENCOUNTER — Inpatient Hospital Stay (HOSPITAL_COMMUNITY)
Admission: EM | Admit: 2012-12-27 | Discharge: 2013-01-02 | DRG: 194 | Disposition: A | Discharge status: Home / Self Care | Payer: Medicare Other | Attending: Endocrinology | Admitting: Endocrinology

## 2012-12-27 DIAGNOSIS — N289 Disorder of kidney and ureter, unspecified: Secondary | ICD-10-CM | POA: Diagnosis present

## 2012-12-27 DIAGNOSIS — E785 Hyperlipidemia, unspecified: Secondary | ICD-10-CM | POA: Diagnosis present

## 2012-12-27 DIAGNOSIS — Z7901 Long term (current) use of anticoagulants: Secondary | ICD-10-CM

## 2012-12-27 DIAGNOSIS — J189 Pneumonia, unspecified organism: Secondary | ICD-10-CM | POA: Diagnosis present

## 2012-12-27 DIAGNOSIS — I10 Essential (primary) hypertension: Secondary | ICD-10-CM | POA: Diagnosis present

## 2012-12-27 DIAGNOSIS — K560 Paralytic ileus: Secondary | ICD-10-CM | POA: Diagnosis present

## 2012-12-27 DIAGNOSIS — R627 Adult failure to thrive: Secondary | ICD-10-CM | POA: Diagnosis present

## 2012-12-27 DIAGNOSIS — Z66 Do not resuscitate: Secondary | ICD-10-CM | POA: Diagnosis present

## 2012-12-27 DIAGNOSIS — M4854XA Collapsed vertebra, not elsewhere classified, thoracic region, initial encounter for fracture: Secondary | ICD-10-CM

## 2012-12-27 DIAGNOSIS — I82509 Chronic embolism and thrombosis of unspecified deep veins of unspecified lower extremity: Secondary | ICD-10-CM | POA: Diagnosis present

## 2012-12-27 DIAGNOSIS — IMO0002 Reserved for concepts with insufficient information to code with codable children: Secondary | ICD-10-CM

## 2012-12-27 DIAGNOSIS — S22009A Unspecified fracture of unspecified thoracic vertebra, initial encounter for closed fracture: Secondary | ICD-10-CM | POA: Diagnosis present

## 2012-12-27 DIAGNOSIS — H9190 Unspecified hearing loss, unspecified ear: Secondary | ICD-10-CM | POA: Diagnosis present

## 2012-12-27 DIAGNOSIS — G609 Hereditary and idiopathic neuropathy, unspecified: Secondary | ICD-10-CM | POA: Diagnosis present

## 2012-12-27 DIAGNOSIS — M542 Cervicalgia: Secondary | ICD-10-CM | POA: Diagnosis present

## 2012-12-27 DIAGNOSIS — Z87891 Personal history of nicotine dependence: Secondary | ICD-10-CM

## 2012-12-27 DIAGNOSIS — D510 Vitamin B12 deficiency anemia due to intrinsic factor deficiency: Secondary | ICD-10-CM | POA: Diagnosis present

## 2012-12-27 DIAGNOSIS — R7309 Other abnormal glucose: Secondary | ICD-10-CM | POA: Diagnosis present

## 2012-12-27 DIAGNOSIS — M810 Age-related osteoporosis without current pathological fracture: Secondary | ICD-10-CM | POA: Diagnosis present

## 2012-12-27 DIAGNOSIS — E860 Dehydration: Secondary | ICD-10-CM | POA: Diagnosis present

## 2012-12-27 DIAGNOSIS — F329 Major depressive disorder, single episode, unspecified: Secondary | ICD-10-CM | POA: Diagnosis present

## 2012-12-27 DIAGNOSIS — G8929 Other chronic pain: Secondary | ICD-10-CM | POA: Diagnosis present

## 2012-12-27 DIAGNOSIS — K5090 Crohn's disease, unspecified, without complications: Secondary | ICD-10-CM | POA: Diagnosis present

## 2012-12-27 DIAGNOSIS — Z86711 Personal history of pulmonary embolism: Secondary | ICD-10-CM

## 2012-12-27 DIAGNOSIS — E46 Unspecified protein-calorie malnutrition: Secondary | ICD-10-CM | POA: Diagnosis present

## 2012-12-27 DIAGNOSIS — Z87311 Personal history of (healed) other pathological fracture: Secondary | ICD-10-CM

## 2012-12-27 DIAGNOSIS — J441 Chronic obstructive pulmonary disease with (acute) exacerbation: Secondary | ICD-10-CM | POA: Diagnosis present

## 2012-12-27 DIAGNOSIS — I2782 Chronic pulmonary embolism: Secondary | ICD-10-CM | POA: Diagnosis present

## 2012-12-27 DIAGNOSIS — G894 Chronic pain syndrome: Secondary | ICD-10-CM | POA: Diagnosis present

## 2012-12-27 DIAGNOSIS — F3289 Other specified depressive episodes: Secondary | ICD-10-CM | POA: Diagnosis present

## 2012-12-27 HISTORY — DX: Unspecified hearing loss, right ear: H91.91

## 2012-12-27 LAB — COMPREHENSIVE METABOLIC PANEL
AST: 19 U/L (ref 0–37)
Albumin: 2.9 g/dL — ABNORMAL LOW (ref 3.5–5.2)
BUN: 10 mg/dL (ref 6–23)
Calcium: 9.7 mg/dL (ref 8.4–10.5)
Chloride: 91 mEq/L — ABNORMAL LOW (ref 96–112)
Creatinine, Ser: 0.75 mg/dL (ref 0.50–1.10)
Total Protein: 6.8 g/dL (ref 6.0–8.3)

## 2012-12-27 LAB — CBC WITH DIFFERENTIAL/PLATELET
Basophils Absolute: 0 10*3/uL (ref 0.0–0.1)
Basophils Relative: 0 % (ref 0–1)
Eosinophils Absolute: 0.1 10*3/uL (ref 0.0–0.7)
HCT: 47.4 % — ABNORMAL HIGH (ref 36.0–46.0)
MCH: 32 pg (ref 26.0–34.0)
MCHC: 32.9 g/dL (ref 30.0–36.0)
Monocytes Absolute: 1.4 10*3/uL — ABNORMAL HIGH (ref 0.1–1.0)
Neutro Abs: 7.2 10*3/uL (ref 1.7–7.7)
RDW: 14 % (ref 11.5–15.5)

## 2012-12-27 MED ORDER — ONDANSETRON HCL 4 MG/2ML IJ SOLN
4.0000 mg | Freq: Once | INTRAMUSCULAR | Status: AC
  Administered 2012-12-27: 4 mg via INTRAVENOUS
  Filled 2012-12-27: qty 2

## 2012-12-27 MED ORDER — MORPHINE SULFATE 2 MG/ML IJ SOLN
2.0000 mg | Freq: Once | INTRAMUSCULAR | Status: AC
  Administered 2012-12-27: 2 mg via INTRAVENOUS
  Filled 2012-12-27: qty 1

## 2012-12-27 MED ORDER — SODIUM CHLORIDE 0.9 % IV BOLUS (SEPSIS)
500.0000 mL | Freq: Once | INTRAVENOUS | Status: AC
  Administered 2012-12-27: 500 mL via INTRAVENOUS

## 2012-12-27 NOTE — ED Notes (Signed)
Pt states she has bad back pain and headache for a few days,  States it is getting worse and "she just doesn't feel good"

## 2012-12-28 DIAGNOSIS — J189 Pneumonia, unspecified organism: Secondary | ICD-10-CM | POA: Diagnosis present

## 2012-12-28 LAB — URINALYSIS, ROUTINE W REFLEX MICROSCOPIC
Glucose, UA: NEGATIVE mg/dL
Leukocytes, UA: NEGATIVE
Specific Gravity, Urine: 1.013 (ref 1.005–1.030)
pH: 7.5 (ref 5.0–8.0)

## 2012-12-28 LAB — URINE MICROSCOPIC-ADD ON

## 2012-12-28 MED ORDER — MORPHINE SULFATE 2 MG/ML IJ SOLN
2.0000 mg | INTRAMUSCULAR | Status: DC | PRN
  Administered 2012-12-28: 2 mg via INTRAVENOUS
  Filled 2012-12-28: qty 1

## 2012-12-28 MED ORDER — GABAPENTIN 300 MG PO CAPS
300.0000 mg | ORAL_CAPSULE | Freq: Every day | ORAL | Status: DC
  Administered 2012-12-28 – 2012-12-29 (×2): 300 mg via ORAL
  Filled 2012-12-28 (×3): qty 1

## 2012-12-28 MED ORDER — DULOXETINE HCL 60 MG PO CPEP
60.0000 mg | ORAL_CAPSULE | Freq: Every morning | ORAL | Status: DC
  Administered 2012-12-28 – 2013-01-02 (×6): 60 mg via ORAL
  Filled 2012-12-28 (×6): qty 1

## 2012-12-28 MED ORDER — MORPHINE SULFATE 2 MG/ML IJ SOLN
2.0000 mg | Freq: Once | INTRAMUSCULAR | Status: AC
  Administered 2012-12-28: 2 mg via INTRAVENOUS
  Filled 2012-12-28: qty 1

## 2012-12-28 MED ORDER — ONDANSETRON HCL 4 MG/2ML IJ SOLN: 4.0000 mg | Freq: Four times a day (QID) | INTRAMUSCULAR | Status: DC | PRN

## 2012-12-28 MED ORDER — OXYBUTYNIN CHLORIDE ER 10 MG PO TB24
10.0000 mg | ORAL_TABLET | Freq: Every day | ORAL | Status: DC
  Administered 2012-12-28 – 2013-01-01 (×5): 10 mg via ORAL
  Filled 2012-12-28 (×6): qty 1

## 2012-12-28 MED ORDER — MOMETASONE FURO-FORMOTEROL FUM 100-5 MCG/ACT IN AERO
2.0000 | INHALATION_SPRAY | Freq: Two times a day (BID) | RESPIRATORY_TRACT | Status: DC
  Administered 2012-12-28 – 2013-01-02 (×11): 2 via RESPIRATORY_TRACT
  Filled 2012-12-28: qty 8.8

## 2012-12-28 MED ORDER — HYDROMORPHONE HCL PF 1 MG/ML IJ SOLN
1.0000 mg | INTRAMUSCULAR | Status: DC | PRN
  Administered 2012-12-28: 1 mg via INTRAVENOUS
  Filled 2012-12-28: qty 2
  Filled 2012-12-28: qty 1

## 2012-12-28 MED ORDER — TIOTROPIUM BROMIDE MONOHYDRATE 18 MCG IN CAPS
18.0000 ug | ORAL_CAPSULE | Freq: Every morning | RESPIRATORY_TRACT | Status: DC
  Administered 2012-12-28 – 2013-01-02 (×6): 18 ug via RESPIRATORY_TRACT
  Filled 2012-12-28 (×2): qty 5

## 2012-12-28 MED ORDER — FENTANYL 75 MCG/HR TD PT72
75.0000 ug | MEDICATED_PATCH | TRANSDERMAL | Status: DC
  Administered 2012-12-30 – 2013-01-01 (×2): 75 ug via TRANSDERMAL
  Filled 2012-12-28 (×2): qty 1

## 2012-12-28 MED ORDER — NADOLOL 20 MG PO TABS
20.0000 mg | ORAL_TABLET | Freq: Every morning | ORAL | Status: DC
  Administered 2012-12-28 – 2013-01-02 (×6): 20 mg via ORAL
  Filled 2012-12-28 (×6): qty 1

## 2012-12-28 MED ORDER — PREDNISONE 10 MG PO TABS
10.0000 mg | ORAL_TABLET | Freq: Every morning | ORAL | Status: DC
  Administered 2012-12-28 – 2013-01-02 (×6): 10 mg via ORAL
  Filled 2012-12-28 (×6): qty 1

## 2012-12-28 MED ORDER — PROMETHAZINE HCL 25 MG PO TABS: 25.0000 mg | ORAL_TABLET | Freq: Four times a day (QID) | ORAL | Status: DC | PRN

## 2012-12-28 MED ORDER — ATORVASTATIN CALCIUM 40 MG PO TABS
40.0000 mg | ORAL_TABLET | Freq: Every evening | ORAL | Status: DC
  Administered 2012-12-28 – 2013-01-01 (×5): 40 mg via ORAL
  Filled 2012-12-28 (×6): qty 1

## 2012-12-28 MED ORDER — DEXTROSE 5 % IV SOLN
500.0000 mg | Freq: Once | INTRAVENOUS | Status: AC
  Administered 2012-12-28: 500 mg via INTRAVENOUS
  Filled 2012-12-28: qty 500

## 2012-12-28 MED ORDER — ONDANSETRON HCL 4 MG PO TABS: 4.0000 mg | ORAL_TABLET | Freq: Four times a day (QID) | ORAL | Status: DC | PRN

## 2012-12-28 MED ORDER — TRAMADOL HCL 50 MG PO TABS
100.0000 mg | ORAL_TABLET | Freq: Three times a day (TID) | ORAL | Status: DC | PRN
  Administered 2012-12-31 – 2013-01-01 (×2): 100 mg via ORAL
  Filled 2012-12-28 (×2): qty 2

## 2012-12-28 MED ORDER — HYDROMORPHONE HCL PF 1 MG/ML IJ SOLN
1.0000 mg | INTRAMUSCULAR | Status: DC | PRN
  Administered 2012-12-28 – 2012-12-29 (×2): 2 mg via INTRAVENOUS
  Administered 2012-12-29: 1 mg via INTRAVENOUS
  Administered 2012-12-30 – 2013-01-01 (×11): 2 mg via INTRAVENOUS
  Administered 2013-01-01 – 2013-01-02 (×3): 1 mg via INTRAVENOUS
  Filled 2012-12-28 (×4): qty 2
  Filled 2012-12-28: qty 1
  Filled 2012-12-28 (×3): qty 2
  Filled 2012-12-28: qty 1
  Filled 2012-12-28: qty 2
  Filled 2012-12-28: qty 1
  Filled 2012-12-28 (×2): qty 2
  Filled 2012-12-28: qty 1
  Filled 2012-12-28 (×2): qty 2

## 2012-12-28 MED ORDER — BIOTENE DRY MOUTH MT LIQD
15.0000 mL | Freq: Two times a day (BID) | OROMUCOSAL | Status: DC
  Administered 2012-12-28 – 2013-01-02 (×11): 15 mL via OROMUCOSAL

## 2012-12-28 MED ORDER — RISAQUAD PO CAPS
1.0000 | ORAL_CAPSULE | Freq: Every day | ORAL | Status: DC
  Administered 2012-12-29 – 2013-01-02 (×5): 1 via ORAL
  Filled 2012-12-28 (×6): qty 1

## 2012-12-28 MED ORDER — FERROUS SULFATE 325 (65 FE) MG PO TABS
325.0000 mg | ORAL_TABLET | Freq: Three times a day (TID) | ORAL | Status: DC
  Administered 2012-12-28 – 2013-01-02 (×16): 325 mg via ORAL
  Filled 2012-12-28 (×19): qty 1

## 2012-12-28 MED ORDER — ALPRAZOLAM 0.25 MG PO TABS
0.2500 mg | ORAL_TABLET | Freq: Three times a day (TID) | ORAL | Status: DC | PRN
  Administered 2012-12-29: 0.25 mg via ORAL
  Filled 2012-12-28: qty 1

## 2012-12-28 MED ORDER — AZITHROMYCIN 500 MG PO TABS
500.0000 mg | ORAL_TABLET | Freq: Every day | ORAL | Status: DC
  Administered 2012-12-28 – 2012-12-31 (×4): 500 mg via ORAL
  Filled 2012-12-28 (×5): qty 1

## 2012-12-28 MED ORDER — RIVAROXABAN 20 MG PO TABS
20.0000 mg | ORAL_TABLET | Freq: Every evening | ORAL | Status: DC
  Administered 2012-12-28 – 2013-01-01 (×5): 20 mg via ORAL
  Filled 2012-12-28 (×6): qty 1

## 2012-12-28 MED ORDER — SODIUM CHLORIDE 0.9 % IV SOLN
250.0000 mg | Freq: Four times a day (QID) | INTRAVENOUS | Status: DC
  Administered 2012-12-28 – 2013-01-02 (×20): 250 mg via INTRAVENOUS
  Filled 2012-12-28 (×22): qty 250

## 2012-12-28 MED ORDER — ALBUTEROL SULFATE (5 MG/ML) 0.5% IN NEBU
2.5000 mg | INHALATION_SOLUTION | Freq: Once | RESPIRATORY_TRACT | Status: AC
  Administered 2012-12-28: 2.5 mg via RESPIRATORY_TRACT
  Filled 2012-12-28: qty 0.5

## 2012-12-28 MED ORDER — AMLODIPINE BESYLATE 2.5 MG PO TABS
2.5000 mg | ORAL_TABLET | Freq: Every day | ORAL | Status: DC
  Administered 2012-12-28 – 2013-01-01 (×5): 2.5 mg via ORAL
  Filled 2012-12-28 (×6): qty 1

## 2012-12-28 MED ORDER — SODIUM CHLORIDE 0.9 % IV SOLN
500.0000 mg | Freq: Once | INTRAVENOUS | Status: AC
  Administered 2012-12-28: 500 mg via INTRAVENOUS
  Filled 2012-12-28 (×2): qty 500

## 2012-12-28 MED ORDER — FAMOTIDINE 20 MG PO TABS
20.0000 mg | ORAL_TABLET | Freq: Two times a day (BID) | ORAL | Status: DC
  Administered 2012-12-28 – 2013-01-02 (×11): 20 mg via ORAL
  Filled 2012-12-28 (×12): qty 1

## 2012-12-28 MED ORDER — POTASSIUM CHLORIDE CRYS ER 10 MEQ PO TBCR
10.0000 meq | EXTENDED_RELEASE_TABLET | Freq: Two times a day (BID) | ORAL | Status: DC
  Administered 2012-12-28 – 2013-01-02 (×11): 10 meq via ORAL
  Filled 2012-12-28 (×12): qty 1

## 2012-12-28 MED ORDER — SODIUM CHLORIDE 0.9 % IV SOLN
INTRAVENOUS | Status: DC
  Administered 2012-12-28: 1000 mL via INTRAVENOUS
  Administered 2012-12-29 – 2013-01-01 (×3): via INTRAVENOUS

## 2012-12-28 MED ORDER — ALIGN 4 MG PO CAPS
1.0000 | ORAL_CAPSULE | Freq: Every morning | ORAL | Status: DC
Start: 2012-12-28 — End: 2012-12-28

## 2012-12-28 NOTE — H&P (Signed)
Katherine Mckay is an 72 y.o. female.   Chief Complaint: back pain HPI: Katherine Mckay is a 72 yo female with severe copd (but not on oxygen at home recently although she has been in the past).  She reports that she was treated with cipro for a possible pneumonia a few weeks ago.  This time she reports to the ER with 2 days of feeling poorly.  She has had worsening back pain in the thoracic area.   She has a chronic productive on a daily basis just a few times a day.  She denies fever.  The cough is not changed.  She reports a bit more SOB in the last two days.  She reports a headache.  She denies blood above or below. She has had a lot of nausea without vomiting in the last two days as well. Last BM was two days ago. In the ER xrays show multiple compression fractures (hard to know if anything acute is present) and evidence of pneumonia and ileus. She will be admitted.  Past Medical History  Diagnosis Date  . Polio   . Osteoporosis     multiple compression fractures  . Crohn's disease     since 1966 on chronic steroids s/p ileal resection, fistula repair  . Brain tumor   . Migraines   . Hypercholesteremia   . Hypertension   . GERD (gastroesophageal reflux disease)   . Anxiety   . Depression   . Emphysema   . Macular degeneration   . Cataract   . Arthritis   . Pernicious anemia   . Primary adrenal deficiency   . COPD (chronic obstructive pulmonary disease)   . Neuropathy, idiopathic   . Anticoagulated on warfarin     Hx of DVT, PE  . Deafness in right ear     Past Surgical History  Procedure Laterality Date  . Cholecystectomy    . Cataract repair    . Ileal resection    . Hemicolectomy      rt due to crohns  . Colonoscopy  08/17/2011    Procedure: COLONOSCOPY;  Surgeon: Freddy Jaksch, MD;  Location: [facility];  Service: Endoscopy;  Laterality: N/A;  . Givens capsule study  09/27/2011    Procedure: GIVENS CAPSULE STUDY;  Surgeon: Freddy Jaksch, MD;  Location: [facility];   Service: Endoscopy;  Laterality: N/A;    Family History  Problem Relation Age of Onset  . Anesthesia problems Neg Hx   . Hypotension Neg Hx   . Malignant hyperthermia Neg Hx   . Pseudochol deficiency Neg Hx    Social History:  reports that she quit smoking about 14 months ago. Her smoking use included Cigarettes. She has a 55 pack-year smoking history. She has never used smokeless tobacco. She reports that she does not drink alcohol or use illicit drugs.  Married, Dennard Nip  46 yrs ago.   She 2 Children, one boy and one girl. 2 GC one boy , one girl.   She has a high school education.  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  Allergies:  Allergies  Allergen Reactions  . Celebrex (Celecoxib) Shortness Of Breath    No voiding  . Smz-Tmp Ds (Sulfamethoxazole W/Trimethoprim (Co-Trimoxazole)) Rash  . Ceclor (Cefaclor) Other (See Comments)    Stomach pain  . Ciprofloxacin Hcl     Possible GI Bleed.  . Diclofenac Sodium     Pt doesn't remember reaction  . Doxycycline  Stomach pains.  . Flagyl (Metronidazole Hcl)     Vaginal rash  . Hydrocodone     Stomach pain  . Iohexol      Code: HIVES, Desc: pt states she breaks out in hives and sneezes 10/01/08   Desc: STATES SHE ONLY REACTS BY SNEEZING   . Prilosec (Omeprazole) Other (See Comments)    Causes ulcers in stomach and esophagus  . Tylenol (Acetaminophen) Other (See Comments)    Liver damage  . Penicillins Rash    Medications Prior to Admission  Medication Sig Dispense Refill  . ALPRAZolam (XANAX) 0.25 MG tablet Take 0.25 mg by mouth 3 (three) times daily as needed for anxiety. For anxiety.      Marland Kitchen amLODipine (NORVASC) 2.5 MG tablet Take 2.5 mg by mouth at bedtime.       Marland Kitchen atorvastatin (LIPITOR) 40 MG tablet Take 1 tablet by mouth every evening.       . calcium carbonate (OS-CAL) 600 MG TABS Take 600 mg by mouth every evening.       . DULoxetine (CYMBALTA) 60 MG capsule Take 60 mg by mouth every morning.       . fentaNYL  (DURAGESIC - DOSED MCG/HR) 75 MCG/HR Place 1 patch onto the skin every other day.        . ferrous sulfate 325 (65 FE) MG tablet Take 1 tablet (325 mg total) by mouth 3 (three) times daily with meals.  90 tablet  3  . gabapentin (NEURONTIN) 300 MG capsule Take 300 mg by mouth every morning.      . mometasone-formoterol (DULERA) 100-5 MCG/ACT AERO Inhale 2 puffs into the lungs 2 (two) times daily.  1 Inhaler  11  . nadolol (CORGARD) 20 MG tablet Take 20 mg by mouth every morning.       . potassium chloride (KLOR-CON) 10 MEQ CR tablet Take 5-10 mEq by mouth every morning. Take 1 tablet every day except take 1/2 tablet on Saturday and Sunday      . predniSONE (DELTASONE) 5 MG tablet Take 5 mg by mouth every morning.       . Probiotic Product (ALIGN) 4 MG CAPS Take 1 capsule by mouth every morning.       . promethazine (PHENERGAN) 25 MG tablet Take 25 mg by mouth every 6 (six) hours as needed for nausea. Nausea      . ranitidine (ZANTAC) 300 MG capsule Take 300 mg by mouth 2 (two) times daily.        . Rivaroxaban 20 MG TABS Take 1 tablet by mouth every evening.      . tiotropium (SPIRIVA) 18 MCG inhalation capsule Place 18 mcg into inhaler and inhale every morning.       . tolterodine (DETROL) 2 MG tablet Take 2 mg by mouth every morning.       . traMADol (ULTRAM) 50 MG tablet Take 100 mg by mouth every 8 (eight) hours as needed for pain.         Results for orders placed during the hospital encounter of 12/27/12 (from the past 48 hour(s))  CBC WITH DIFFERENTIAL     Status: Abnormal   Collection Time    12/27/12 11:00 PM      Result Value Range   WBC 10.8 (*) 4.0 - 10.5 K/uL   RBC 4.88  3.87 - 5.11 MIL/uL   Hemoglobin 15.6 (*) 12.0 - 15.0 g/dL   HCT 91.4 (*) 78.2 - 95.6 %   MCV 97.1  78.0 - 100.0 fL   MCH 32.0  26.0 - 34.0 pg   MCHC 32.9  30.0 - 36.0 g/dL   RDW 40.9  81.1 - 91.4 %   Platelets 190  150 - 400 K/uL   Neutrophils Relative % 67  43 - 77 %   Neutro Abs 7.2  1.7 - 7.7 K/uL    Lymphocytes Relative 19  12 - 46 %   Lymphs Abs 2.1  0.7 - 4.0 K/uL   Monocytes Relative 13 (*) 3 - 12 %   Monocytes Absolute 1.4 (*) 0.1 - 1.0 K/uL   Eosinophils Relative 1  0 - 5 %   Eosinophils Absolute 0.1  0.0 - 0.7 K/uL   Basophils Relative 0  0 - 1 %   Basophils Absolute 0.0  0.0 - 0.1 K/uL  COMPREHENSIVE METABOLIC PANEL     Status: Abnormal   Collection Time    12/27/12 11:00 PM      Result Value Range   Sodium 133 (*) 135 - 145 mEq/L   Potassium 3.6  3.5 - 5.1 mEq/L   Chloride 91 (*) 96 - 112 mEq/L   CO2 30  19 - 32 mEq/L   Glucose, Bld 91  70 - 99 mg/dL   BUN 10  6 - 23 mg/dL   Creatinine, Ser 7.82  0.50 - 1.10 mg/dL   Calcium 9.7  8.4 - 95.6 mg/dL   Total Protein 6.8  6.0 - 8.3 g/dL   Albumin 2.9 (*) 3.5 - 5.2 g/dL   AST 19  0 - 37 U/L   ALT 16  0 - 35 U/L   Alkaline Phosphatase 105  39 - 117 U/L   Total Bilirubin 1.1  0.3 - 1.2 mg/dL   GFR calc non Af Amer 84 (*) >90 mL/min   GFR calc Af Amer >90  >90 mL/min   Comment:            The eGFR has been calculated     using the CKD EPI equation.     This calculation has not been     validated in all clinical     situations.     eGFR's persistently     <90 mL/min signify     possible Chronic Kidney Disease.  URINALYSIS, ROUTINE W REFLEX MICROSCOPIC     Status: Abnormal   Collection Time    12/28/12 12:15 AM      Result Value Range   Color, Urine YELLOW  YELLOW   APPearance CLOUDY (*) CLEAR   Specific Gravity, Urine 1.013  1.005 - 1.030   pH 7.5  5.0 - 8.0   Glucose, UA NEGATIVE  NEGATIVE mg/dL   Hgb urine dipstick LARGE (*) NEGATIVE   Bilirubin Urine NEGATIVE  NEGATIVE   Ketones, ur 40 (*) NEGATIVE mg/dL   Protein, ur NEGATIVE  NEGATIVE mg/dL   Urobilinogen, UA 0.2  0.0 - 1.0 mg/dL   Nitrite NEGATIVE  NEGATIVE   Leukocytes, UA NEGATIVE  NEGATIVE  URINE MICROSCOPIC-ADD ON     Status: None   Collection Time    12/28/12 12:15 AM      Result Value Range   Squamous Epithelial / LPF RARE  RARE   WBC, UA 0-2   <3 WBC/hpf   RBC / HPF TOO NUMEROUS TO COUNT  <3 RBC/hpf   Dg Thoracic Spine 2 View  12/28/2012   *RADIOLOGY REPORT*  Clinical Data: Back pain and headache  THORACIC SPINE - 2 VIEW  Comparison: 12/08/2012  Findings:  Bones are markedly osteopenic which significantly diminishes bone detail.  Again noted are multilevel compression fractures identified throughout the thoracic spine with resultant kyphosis deformity.  When compared with previous exam the overall appearance is not significantly changed.  There is a calcified atherosclerotic disease affecting the thoracic aorta.  IMPRESSION:  1. Multilevel compression deformities throughout the thoracic spine with associated kyphosis.   Original Report Authenticated By: Signa Kell, M.D.   Ct Head Wo Contrast  12/28/2012   *RADIOLOGY REPORT*  Clinical Data: Back pain and headache  CT HEAD WITHOUT CONTRAST  Technique:  Contiguous axial images were obtained from the base of the skull through the vertex without contrast.  Comparison: 04/08/2012  Findings: There is diffuse patchy low density throughout the subcortical and periventricular white matter consistent with chronic small vessel ischemic change.  Chronic bilateral basal ganglia lacunar infarcts appears similar to previous exam  There is prominence of the sulci and ventricles consistent with brain atrophy.  There is no evidence for acute brain infarct, hemorrhage or mass.  The paranasal sinuses appear clear.  The mastoid air cells are clear.  The skull is intact.  IMPRESSION:  1.  Small vessel ischemic disease and brain atrophy. 2.  No acute intracranial abnormalities   Original Report Authenticated By: Signa Kell, M.D.   Dg Abd Acute W/chest  12/27/2012   *RADIOLOGY REPORT*  Clinical Data: Back pain.  COPD.  Emphysema.  Crohn's disease.  ACUTE ABDOMEN SERIES (ABDOMEN 2 VIEW & CHEST 1 VIEW)  Comparison: Multiple exams, including 12/08/2012 and 08/13/2011  Findings: Prominent kyphosis with multiple  thoracic compression fractures and multiple old bilateral rib fractures.  Patchy airspace opacity observed at the right lung base.  Chest radiograph prominently rotated to the right.  Tortuous thoracic aorta.  No free intraperitoneal gas observed on the lateral decubitus view. There are air-fluid levels in dilated loops of small bowel and in the gas filled ascending colon.  Atherosclerosis noted.  Lumbar compression fractures include L2.  IMPRESSION:  1.  Dilated loops of small bowel with air-fluid levels, and abnormal but nonspecific pattern which could reflect ileus or obstruction. 2.  Right lower lobe patchy airspace opacity, query pneumonia. 3.  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  Multiple old bilateral rib fractures.   Original Report Authenticated By: Gaylyn Rong, M.D.    ROS:as per hpi.  Normal walks without assistive device.  Blood pressure 153/76, pulse 82, temperature 98.2 F (36.8 C), temperature source Oral, resp. rate 20, height 5\' 2"  (1.575 m), weight 57.652 kg (127 lb 1.6 oz), SpO2 98.00%.  Elderly appearing female, appears older than stated age. No Acute distress.  A and O times 4.  some eructation.  She has no jvd, no pallor. Appears weak.  Lungs reveal slight insp. Rales at the bases bilaterally, no rhonchi or wheeze.RRR no m/r/g.  Abd is soft, not tender, no mass, no hsm, nabs. No edema. Moe x4 with grossly NL strength.  Assessment/Plan 72 yo female with severe copd who may have a pneumonia and copd exacerbation.   She also has an ileus (hopefully no psbo) and back pain (a new fracture in back is always possible given the severity of her osteoporosis although current xray is read as not truly different than past one).  We will admit, hydrate gently.  We will cover her with primaxin and azithro.   I will bump up her prednisone dose slightly.  We will see if she can  tolerate clear liquids.  She wants a DNR order and I will put that on her  chart.  She is very sure about this even though she has never had a DNR order in the past apparently.  This is reasonable given the severity of her COPD and osteoporosis.  Ezequiel Kayser, MD 12/28/2012, 7:34 AM

## 2012-12-28 NOTE — Progress Notes (Signed)
ANTIBIOTIC CONSULT NOTE - INITIAL  Pharmacy Consult for Primaxin Indication: pneumonia  Allergies  Allergen Reactions  . Celebrex (Celecoxib) Shortness Of Breath    No voiding  . Smz-Tmp Ds (Sulfamethoxazole W/Trimethoprim (Co-Trimoxazole)) Rash  . Ceclor (Cefaclor) Other (See Comments)    Stomach pain  . Ciprofloxacin Hcl     Possible GI Bleed.  . Diclofenac Sodium     Pt doesn't remember reaction  . Doxycycline     Stomach pains.  . Flagyl (Metronidazole Hcl)     Vaginal rash  . Hydrocodone     Stomach pain  . Iohexol      Code: HIVES, Desc: pt states she breaks out in hives and sneezes 10/01/08   Desc: STATES SHE ONLY REACTS BY SNEEZING   . Prilosec (Omeprazole) Other (See Comments)    Causes ulcers in stomach and esophagus  . Tylenol (Acetaminophen) Other (See Comments)    Liver damage  . Penicillins Rash    Patient Measurements: Height: 5\' 2"  (157.5 cm) Weight: 127 lb 1.6 oz (57.652 kg) IBW/kg (Calculated) : 50.1  Vital Signs: Temp: 98.2 F (36.8 C) (05/18 0601) Temp src: Oral (05/18 0601) BP: 153/76 mmHg (05/18 0601) Pulse Rate: 82 (05/18 0601) Intake/Output from previous day:   Intake/Output from this shift:    Labs:  Recent Labs  12/27/12 2300  WBC 10.8*  HGB 15.6*  PLT 190  CREATININE 0.75   Estimated Creatinine Clearance: 52.5 ml/min (by C-G formula based on Cr of 0.75). No results found for this basename: VANCOTROUGH, VANCOPEAK, VANCORANDOM, GENTTROUGH, GENTPEAK, GENTRANDOM, TOBRATROUGH, TOBRAPEAK, TOBRARND, AMIKACINPEAK, AMIKACINTROU, AMIKACIN,  in the last 72 hours   Microbiology: No results found for this or any previous visit (from the past 720 hour(s)).  Medical History: Past Medical History  Diagnosis Date  . Polio   . Osteoporosis     multiple compression fractures  . Crohn's disease     since 1966 on chronic steroids s/p ileal resection, fistula repair  . Brain tumor   . Migraines   . Hypercholesteremia   . Hypertension    . GERD (gastroesophageal reflux disease)   . Anxiety   . Depression   . Emphysema   . Macular degeneration   . Cataract   . Arthritis   . Pernicious anemia   . Primary adrenal deficiency   . COPD (chronic obstructive pulmonary disease)   . Neuropathy, idiopathic   . Anticoagulated on warfarin     Hx of DVT, PE  . Deafness in right ear     Assessment: 66 yof with severe COPD presented 5/17 with back pain, headache.  Pt reported she was treated with Ciprofloxacin a few weeks ago for presumed pneumonia. Head CT negative, CXR suspicious for PNA, abd film concerning for ileus vs obstruction.  MD ordered Primaxin for r/o pneumonia, azithromycin also on board. Pt received a dose of Primaxin in the ED at 0429 this AM.   Afebrile, WBC 10.8, Scr 0.75 for CG CrCl 53 ml/min, normalized CrCl of 75 ml/min.   No cultures ordered   Plan:   Primaxin 250 mg IV q6h  Pharmacy will f/u  Geoffry Paradise, PharmD, BCPS Pager: [phone] 8:37 AM Pharmacy #: [phone]

## 2012-12-28 NOTE — ED Provider Notes (Signed)
Medical screening examination/treatment/procedure(s) were performed by non-physician practitioner and as supervising physician I was immediately available for consultation/collaboration.  John-Adam Riyan Haile, M.D.     John-Adam Shekira Drummer, MD 12/28/12 0830 

## 2012-12-28 NOTE — ED Provider Notes (Signed)
History     CSN: [id number]  Arrival date & time 12/27/12  2115   First MD Initiated Contact with Patient 12/27/12 2152      Chief Complaint  Patient presents with  . Back Pain  . Headache    (Consider location/radiation/quality/duration/timing/severity/associated sxs/prior treatment) Patient is a 72 y.o. female presenting with back pain and headaches. The history is provided by the patient and the spouse.  Back Pain Location:  Thoracic spine Associated symptoms: headaches and weakness   Associated symptoms: no abdominal pain, no chest pain, no dysuria and no fever   Associated symptoms comment:  Upper back pain for several days without known injury. She denies dyspnea but pain is worse with movement, deep breathing and cough. Today she developed a gradual onset headache in the left frontal area associated with nausea and vomiting. She reports history of less intense headache but "its never been like this". No fever. She denies abdominal pain, dysuria or change in bowel habits. She is currently using a Fentanyl patch and Ultram for control of chronic back pain but neither of these measures is helping with her headache.  Headache Associated symptoms: back pain, cough, nausea and vomiting   Associated symptoms: no abdominal pain, no fever and no neck stiffness     Past Medical History  Diagnosis Date  . Polio   . Osteoporosis     multiple compression fractures  . Crohn's disease     since 1966 on chronic steroids s/p ileal resection, fistula repair  . Brain tumor   . Migraines   . Hypercholesteremia   . Hypertension   . GERD (gastroesophageal reflux disease)   . Anxiety   . Depression   . Emphysema   . Macular degeneration   . Cataract   . Arthritis   . Pernicious anemia   . Primary adrenal deficiency   . COPD (chronic obstructive pulmonary disease)   . Neuropathy, idiopathic   . Anticoagulated on warfarin     Hx of DVT, PE  . Deafness in right ear     Past  Surgical History  Procedure Laterality Date  . Cholecystectomy    . Cataract repair    . Ileal resection    . Hemicolectomy      rt due to crohns  . Colonoscopy  08/17/2011    Procedure: COLONOSCOPY;  Surgeon: Freddy Jaksch, MD;  Location: [facility];  Service: Endoscopy;  Laterality: N/A;  . Givens capsule study  09/27/2011    Procedure: GIVENS CAPSULE STUDY;  Surgeon: Freddy Jaksch, MD;  Location: [facility];  Service: Endoscopy;  Laterality: N/A;    Family History  Problem Relation Age of Onset  . Anesthesia problems Neg Hx   . Hypotension Neg Hx   . Malignant hyperthermia Neg Hx   . Pseudochol deficiency Neg Hx     History  Substance Use Topics  . Smoking status: Former Smoker -- 1.00 packs/day for 55 years    Types: Cigarettes    Quit date: 10/28/2011  . Smokeless tobacco: Never Used  . Alcohol Use: No    OB History   Grav Para Term Preterm Abortions TAB SAB Ect Mult Living                  Review of Systems  Constitutional: Negative for fever.  HENT: Negative for neck stiffness.   Respiratory: Positive for cough. Negative for shortness of breath.        Has chronic cough with history of  non-oxygen dependent emphysema.   Cardiovascular: Negative for chest pain.  Gastrointestinal: Positive for nausea and vomiting. Negative for abdominal pain.  Genitourinary: Negative for dysuria.  Musculoskeletal: Positive for back pain.  Neurological: Positive for weakness and headaches.  Psychiatric/Behavioral: Negative for confusion.    Allergies  Celebrex; Smz-tmp ds; Ceclor; Ciprofloxacin hcl; Diclofenac sodium; Doxycycline; Flagyl; Hydrocodone; Iohexol; Prilosec; Tylenol; and Penicillins  Home Medications   Current Outpatient Rx  Name  Route  Sig  Dispense  Refill  . ALPRAZolam (XANAX) 0.25 MG tablet   Oral   Take 0.25 mg by mouth 3 (three) times daily as needed for anxiety. For anxiety.         Marland Kitchen amLODipine (NORVASC) 2.5 MG tablet   Oral   Take 2.5 mg  by mouth at bedtime.          Marland Kitchen atorvastatin (LIPITOR) 40 MG tablet   Oral   Take 1 tablet by mouth every evening.          . calcium carbonate (OS-CAL) 600 MG TABS   Oral   Take 600 mg by mouth every evening.          . DULoxetine (CYMBALTA) 60 MG capsule   Oral   Take 60 mg by mouth every morning.          . fentaNYL (DURAGESIC - DOSED MCG/HR) 75 MCG/HR   Transdermal   Place 1 patch onto the skin every other day.           . ferrous sulfate 325 (65 FE) MG tablet   Oral   Take 1 tablet (325 mg total) by mouth 3 (three) times daily with meals.   90 tablet   3   . gabapentin (NEURONTIN) 300 MG capsule   Oral   Take 300 mg by mouth every morning.         . mometasone-formoterol (DULERA) 100-5 MCG/ACT AERO   Inhalation   Inhale 2 puffs into the lungs 2 (two) times daily.   1 Inhaler   11   . nadolol (CORGARD) 20 MG tablet   Oral   Take 20 mg by mouth every morning.          . potassium chloride (KLOR-CON) 10 MEQ CR tablet   Oral   Take 5-10 mEq by mouth every morning. Take 1 tablet every day except take 1/2 tablet on Saturday and Sunday         . predniSONE (DELTASONE) 5 MG tablet   Oral   Take 5 mg by mouth every morning.          . Probiotic Product (ALIGN) 4 MG CAPS   Oral   Take 1 capsule by mouth every morning.          . promethazine (PHENERGAN) 25 MG tablet   Oral   Take 25 mg by mouth every 6 (six) hours as needed for nausea. Nausea         . ranitidine (ZANTAC) 300 MG capsule   Oral   Take 300 mg by mouth 2 (two) times daily.           . Rivaroxaban 20 MG TABS   Oral   Take 1 tablet by mouth every evening.         . tiotropium (SPIRIVA) 18 MCG inhalation capsule   Inhalation   Place 18 mcg into inhaler and inhale every morning.          . tolterodine (DETROL) 2 MG tablet  Oral   Take 2 mg by mouth every morning.          . traMADol (ULTRAM) 50 MG tablet   Oral   Take 100 mg by mouth every 8 (eight) hours as  needed for pain.            BP 152/89  Pulse 91  Temp(Src) 98.5 F (36.9 C) (Oral)  Resp 18  SpO2 93%  Physical Exam  Constitutional: She is oriented to person, place, and time. She appears well-developed and well-nourished. No distress.  HENT:  Head: Normocephalic.  Mouth/Throat: Mucous membranes are dry.  Neck: Normal range of motion.  Cardiovascular: Normal rate.   No murmur heard. Pulmonary/Chest: Effort normal. She has no wheezes. She has rales.  Abdominal: Soft. Bowel sounds are normal. There is no tenderness. There is no rebound and no guarding.  Musculoskeletal: Normal range of motion. She exhibits no edema.  Bilateral paraspinal tenderness of thoracic and lumbar spine. No swelling, discoloration or deformity.  Neurological: She is alert and oriented to person, place, and time.  Skin: Skin is warm and dry.  Psychiatric: She has a normal mood and affect.    ED Course  Procedures (including critical care time)  Labs Reviewed  CBC WITH DIFFERENTIAL - Abnormal; Notable for the following:    WBC 10.8 (*)    Hemoglobin 15.6 (*)    HCT 47.4 (*)    Monocytes Relative 13 (*)    Monocytes Absolute 1.4 (*)    All other components within normal limits  COMPREHENSIVE METABOLIC PANEL - Abnormal; Notable for the following:    Sodium 133 (*)    Chloride 91 (*)    Albumin 2.9 (*)    GFR calc non Af Amer 84 (*)    All other components within normal limits  URINALYSIS, ROUTINE W REFLEX MICROSCOPIC   Dg Thoracic Spine 2 View  12/28/2012   *RADIOLOGY REPORT*  Clinical Data: Back pain and headache  THORACIC SPINE - 2 VIEW  Comparison: 12/08/2012  Findings:  Bones are markedly osteopenic which significantly diminishes bone detail.  Again noted are multilevel compression fractures identified throughout the thoracic spine with resultant kyphosis deformity.  When compared with previous exam the overall appearance is not significantly changed.  There is a calcified atherosclerotic  disease affecting the thoracic aorta.  IMPRESSION:  1. Multilevel compression deformities throughout the thoracic spine with associated kyphosis.   Original Report Authenticated By: Signa Kell, M.D.   Ct Head Wo Contrast  12/28/2012   *RADIOLOGY REPORT*  Clinical Data: Back pain and headache  CT HEAD WITHOUT CONTRAST  Technique:  Contiguous axial images were obtained from the base of the skull through the vertex without contrast.  Comparison: 04/08/2012  Findings: There is diffuse patchy low density throughout the subcortical and periventricular white matter consistent with chronic small vessel ischemic change.  Chronic bilateral basal ganglia lacunar infarcts appears similar to previous exam  There is prominence of the sulci and ventricles consistent with brain atrophy.  There is no evidence for acute brain infarct, hemorrhage or mass.  The paranasal sinuses appear clear.  The mastoid air cells are clear.  The skull is intact.  IMPRESSION:  1.  Small vessel ischemic disease and brain atrophy. 2.  No acute intracranial abnormalities   Original Report Authenticated By: Signa Kell, M.D.   Dg Abd Acute W/chest  12/27/2012   *RADIOLOGY REPORT*  Clinical Data: Back pain.  COPD.  Emphysema.  Crohn's disease.  ACUTE ABDOMEN SERIES (  ABDOMEN 2 VIEW & CHEST 1 VIEW)  Comparison: Multiple exams, including 12/08/2012 and 08/13/2011  Findings: Prominent kyphosis with multiple thoracic compression fractures and multiple old bilateral rib fractures.  Patchy airspace opacity observed at the right lung base.  Chest radiograph prominently rotated to the right.  Tortuous thoracic aorta.  No free intraperitoneal gas observed on the lateral decubitus view. There are air-fluid levels in dilated loops of small bowel and in the gas filled ascending colon.  Atherosclerosis noted.  Lumbar compression fractures include L2.  IMPRESSION:  1.  Dilated loops of small bowel with air-fluid levels, and abnormal but nonspecific pattern  which could reflect ileus or obstruction. 2.  Right lower lobe patchy airspace opacity, query pneumonia. 3.  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  Multiple old bilateral rib fractures.   Original Report Authenticated By: Gaylyn Rong, M.D.     No diagnosis found.  1. Community Acquired Pneumonia 2. Thoracic compression fractures 3. H/o emphysema   MDM  Patient appears nontoxic but uncomfortable. Neuro exam without deficit - normal head CT - headache improved with medication - no change in mentation on multiple re-evaluations. She was 88% on room air that improves to 92% on oxygen. CXR suspicious for pneumonia - abx started. Albuterol nebulizers also ordered. Plain films also show thoracic compression fractures, assume acute. Patient to be admitted for PNA treatment and pain control.   Abdominal film showing air fluid levels - ileus vs obstruction. She continues to deny abdominal pain, and is non-tender on re-evaluation. She is kept NPO and admitting physician made aware of findings.         Arnoldo Hooker, PA-C 12/28/12 0106

## 2012-12-29 LAB — CBC WITH DIFFERENTIAL/PLATELET
Eosinophils Absolute: 0 10*3/uL (ref 0.0–0.7)
Eosinophils Relative: 0 % (ref 0–5)
HCT: 45.3 % (ref 36.0–46.0)
Lymphocytes Relative: 20 % (ref 12–46)
Lymphs Abs: 1.3 10*3/uL (ref 0.7–4.0)
MCH: 32.3 pg (ref 26.0–34.0)
MCV: 95.6 fL (ref 78.0–100.0)
Monocytes Absolute: 0.7 10*3/uL (ref 0.1–1.0)
Monocytes Relative: 10 % (ref 3–12)
RBC: 4.74 MIL/uL (ref 3.87–5.11)
WBC: 6.5 10*3/uL (ref 4.0–10.5)

## 2012-12-29 LAB — COMPREHENSIVE METABOLIC PANEL
AST: 17 U/L (ref 0–37)
Albumin: 2.3 g/dL — ABNORMAL LOW (ref 3.5–5.2)
BUN: 24 mg/dL — ABNORMAL HIGH (ref 6–23)
Chloride: 94 mEq/L — ABNORMAL LOW (ref 96–112)
Creatinine, Ser: 1.25 mg/dL — ABNORMAL HIGH (ref 0.50–1.10)
Potassium: 4.4 mEq/L (ref 3.5–5.1)
Total Bilirubin: 0.6 mg/dL (ref 0.3–1.2)
Total Protein: 6 g/dL (ref 6.0–8.3)

## 2012-12-29 NOTE — Progress Notes (Signed)
Pt refused to sit up in barcalounger today, sleeping off and on. C/O pain on the Lt. Side of her neck that goes into her Lt. Ear and up to her temple. It also goes down to her upper back. States she has never felt pain that bad before in that area. Ask for me to call Dr. [name] and get a neurology consult while she is in the hospital. Spoke with Dr. Rinaldo Cloud nurse, she states Dr. [name] wants to do it as an outpt, he feels she has too much going on now. Pt has poor po intake. Voided once today around 1640-( 450 ml.

## 2012-12-29 NOTE — Progress Notes (Signed)
Subjective: Had a better night. Back pain is considerably better. Breathing is pretty good. No cough. No chills or sweats. Passing some gas but no BM. Eating little. Not out of bed any   Objective: Vital signs in last 24 hours: Temp:  [97.1 F (36.2 C)-98.2 F (36.8 C)] 97.6 F (36.4 C) (05/19 0605) Pulse Rate:  [65-77] 65 (05/19 0605) Resp:  [16] 16 (05/19 0605) BP: (90-156)/(46-71) 90/46 mmHg (05/19 0605) SpO2:  [93 %-98 %] 93 % (05/19 0605) Weight:  [57.38 kg (126 lb 8 oz)] 57.38 kg (126 lb 8 oz) (05/19 0605)  Intake/Output from previous day: 05/18 0701 - 05/19 0700 In: 882.9 [P.O.:640; I.V.:242.9] Out: -  Intake/Output this shift:    General: fatigued, non toxic. Lying on right side. Edentulous. Kyphotic with distant breath sounds, no wheeze. Ht regular, distant. abd min distention, BS's diminished. Awake. Alert mentating well  Lab Results   Recent Labs  12/27/12 2300 12/29/12 0355  WBC 10.8* 6.5  RBC 4.88 4.74  HGB 15.6* 15.3*  HCT 47.4* 45.3  MCV 97.1 95.6  MCH 32.0 32.3  RDW 14.0 13.9  PLT 190 206    Recent Labs  12/27/12 2300 12/29/12 0355  NA 133* 133*  K 3.6 4.4  CL 91* 94*  CO2 30 25  GLUCOSE 91 68*  BUN 10 24*  CREATININE 0.75 1.25*  CALCIUM 9.7 8.9    Studies/Results: Dg Thoracic Spine 2 View  12/28/2012   *RADIOLOGY REPORT*  Clinical Data: Back pain and headache  THORACIC SPINE - 2 VIEW  Comparison: 12/08/2012  Findings:  Bones are markedly osteopenic which significantly diminishes bone detail.  Again noted are multilevel compression fractures identified throughout the thoracic spine with resultant kyphosis deformity.  When compared with previous exam the overall appearance is not significantly changed.  There is a calcified atherosclerotic disease affecting the thoracic aorta.  IMPRESSION:  1. Multilevel compression deformities throughout the thoracic spine with associated kyphosis.   Original Report Authenticated By: Signa Kell, M.D.   Ct  Head Wo Contrast  12/28/2012   *RADIOLOGY REPORT*  Clinical Data: Back pain and headache  CT HEAD WITHOUT CONTRAST  Technique:  Contiguous axial images were obtained from the base of the skull through the vertex without contrast.  Comparison: 04/08/2012  Findings: There is diffuse patchy low density throughout the subcortical and periventricular white matter consistent with chronic small vessel ischemic change.  Chronic bilateral basal ganglia lacunar infarcts appears similar to previous exam  There is prominence of the sulci and ventricles consistent with brain atrophy.  There is no evidence for acute brain infarct, hemorrhage or mass.  The paranasal sinuses appear clear.  The mastoid air cells are clear.  The skull is intact.  IMPRESSION:  1.  Small vessel ischemic disease and brain atrophy. 2.  No acute intracranial abnormalities   Original Report Authenticated By: Signa Kell, M.D.   Dg Abd Acute W/chest  12/27/2012   *RADIOLOGY REPORT*  Clinical Data: Back pain.  COPD.  Emphysema.  Crohn's disease.  ACUTE ABDOMEN SERIES (ABDOMEN 2 VIEW & CHEST 1 VIEW)  Comparison: Multiple exams, including 12/08/2012 and 08/13/2011  Findings: Prominent kyphosis with multiple thoracic compression fractures and multiple old bilateral rib fractures.  Patchy airspace opacity observed at the right lung base.  Chest radiograph prominently rotated to the right.  Tortuous thoracic aorta.  No free intraperitoneal gas observed on the lateral decubitus view. There are air-fluid levels in dilated loops of small bowel and in the gas  filled ascending colon.  Atherosclerosis noted.  Lumbar compression fractures include L2.  IMPRESSION:  1.  Dilated loops of small bowel with air-fluid levels, and abnormal but nonspecific pattern which could reflect ileus or obstruction. 2.  Right lower lobe patchy airspace opacity, query pneumonia. 3.  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  Multiple old bilateral rib  fractures.   Original Report Authenticated By: Gaylyn Rong, M.D.    Scheduled Meds: . acidophilus  1 capsule Oral Daily  . amLODipine  2.5 mg Oral QHS  . antiseptic oral rinse  15 mL Mouth Rinse BID  . atorvastatin  40 mg Oral QPM  . azithromycin  500 mg Oral Daily  . DULoxetine  60 mg Oral q morning - 10a  . famotidine  20 mg Oral BID  . [START ON 12/30/2012] fentaNYL  75 mcg Transdermal QODAY  . ferrous sulfate  325 mg Oral TID WC  . gabapentin  300 mg Oral QHS  . imipenem-cilastatin  250 mg Intravenous Q6H  . mometasone-formoterol  2 puff Inhalation BID  . nadolol  20 mg Oral q morning - 10a  . oxybutynin  10 mg Oral QHS  . potassium chloride  10 mEq Oral BID  . predniSONE  10 mg Oral q morning - 10a  . Rivaroxaban  20 mg Oral QPM  . tiotropium  18 mcg Inhalation q morning - 10a   Continuous Infusions: . sodium chloride 1,000 mL (12/28/12 0817)   PRN Meds:ALPRAZolam, HYDROmorphone (DILAUDID) injection, ondansetron (ZOFRAN) IV, ondansetron, promethazine, traMADol  Assessment/Plan: PNEUMONIA: doing pretty well. Continue Abx BACK PAIN: much better ILEUS: seems to be improving COPD: continue O2 HYPERTENSION: BP a bit on the low side RENAL INSUFFICIENCY : increase fluids. Cr up some DEPRESSION: continue Rx OSTEOPOROSIS WITH MULTIPLE FX: pain is better DVT PROPHYLAXIS: On Xarelto, no bleeding noted    LOS: 2 days   Lilton Pare [name] 12/29/2012, 8:27 AM

## 2012-12-30 LAB — CBC WITH DIFFERENTIAL/PLATELET
Eosinophils Absolute: 0 10*3/uL (ref 0.0–0.7)
Hemoglobin: 12.9 g/dL (ref 12.0–15.0)
Lymphocytes Relative: 21 % (ref 12–46)
Lymphs Abs: 1.1 10*3/uL (ref 0.7–4.0)
MCH: 30.8 pg (ref 26.0–34.0)
Monocytes Relative: 14 % — ABNORMAL HIGH (ref 3–12)
Neutro Abs: 3.3 10*3/uL (ref 1.7–7.7)
Neutrophils Relative %: 65 % (ref 43–77)
Platelets: 205 10*3/uL (ref 150–400)
RBC: 4.19 MIL/uL (ref 3.87–5.11)
WBC: 5.1 10*3/uL (ref 4.0–10.5)

## 2012-12-30 LAB — COMPREHENSIVE METABOLIC PANEL
AST: 16 U/L (ref 0–37)
BUN: 26 mg/dL — ABNORMAL HIGH (ref 6–23)
CO2: 27 mEq/L (ref 19–32)
Calcium: 8.5 mg/dL (ref 8.4–10.5)
Creatinine, Ser: 1.03 mg/dL (ref 0.50–1.10)
GFR calc non Af Amer: 54 mL/min — ABNORMAL LOW (ref >90)

## 2012-12-30 MED ORDER — GABAPENTIN 300 MG PO CAPS
300.0000 mg | ORAL_CAPSULE | Freq: Two times a day (BID) | ORAL | Status: DC
  Administered 2012-12-30 – 2013-01-02 (×7): 300 mg via ORAL
  Filled 2012-12-30 (×8): qty 1

## 2012-12-30 NOTE — Progress Notes (Signed)
Subjective: Had nuchal pain and left ear area pain yesterday, basically resolved today. Back is doing better. Passing some gas, no BM yet. No abd pain. Some cough. Eating little. Not mobile  Objective: Vital signs in last 24 hours: Temp:  [97.4 F (36.3 C)-97.6 F (36.4 C)] 97.4 F (36.3 C) (05/20 0630) Pulse Rate:  [57-63] 57 (05/20 0630) Resp:  [16-20] 16 (05/20 0630) BP: (106-138)/(53-71) 106/53 mmHg (05/20 0630) SpO2:  [92 %-98 %] 92 % (05/20 0758) Weight:  [58.832 kg (129 lb 11.2 oz)] 58.832 kg (129 lb 11.2 oz) (05/20 0630)  Intake/Output from previous day: 05/19 0701 - 05/20 0700 In: 1450 [P.O.:650; I.V.:600; IV Piggyback:200] Out: 1100 [Urine:1100] Intake/Output this shift:    General: appears older than stated age sitting up in no distress. Face symmetric, kyphotic with reduced breath sounds., ht regular distant. abd non distended, hypoactive BS's. Awake, alert, mentating well  Lab Results   Recent Labs  12/29/12 0355 12/30/12 0400  WBC 6.5 5.1  RBC 4.74 4.19  HGB 15.3* 12.9  HCT 45.3 40.6  MCV 95.6 96.9  MCH 32.3 30.8  RDW 13.9 14.0  PLT 206 205    Recent Labs  12/29/12 0355 12/30/12 0400  NA 133* 136  K 4.4 4.7  CL 94* 102  CO2 25 27  GLUCOSE 68* 83  BUN 24* 26*  CREATININE 1.25* 1.03  CALCIUM 8.9 8.5    Studies/Results: No results found.  Scheduled Meds: . acidophilus  1 capsule Oral Daily  . amLODipine  2.5 mg Oral QHS  . antiseptic oral rinse  15 mL Mouth Rinse BID  . atorvastatin  40 mg Oral QPM  . azithromycin  500 mg Oral Daily  . DULoxetine  60 mg Oral q morning - 10a  . famotidine  20 mg Oral BID  . fentaNYL  75 mcg Transdermal QODAY  . ferrous sulfate  325 mg Oral TID WC  . gabapentin  300 mg Oral QHS  . imipenem-cilastatin  250 mg Intravenous Q6H  . mometasone-formoterol  2 puff Inhalation BID  . nadolol  20 mg Oral q morning - 10a  . oxybutynin  10 mg Oral QHS  . potassium chloride  10 mEq Oral BID  . predniSONE  10 mg Oral  q morning - 10a  . Rivaroxaban  20 mg Oral QPM  . tiotropium  18 mcg Inhalation q morning - 10a   Continuous Infusions: . sodium chloride 75 mL/hr at 12/29/12 2233   PRN Meds:ALPRAZolam, HYDROmorphone (DILAUDID) injection, ondansetron (ZOFRAN) IV, ondansetron, promethazine, traMADol  Assessment/Plan:  PNEUMONIA: doing pretty well. Continue Abx . Check XR in AM BACK PAIN: much better overall ILEUS: seems to be improving , advance diet COPD: continue O2  HYPERTENSION: BP a bit on the low side  RENAL INSUFFICIENCY : increase fluids. Cr better today  DEPRESSION: continue Rx  OSTEOPOROSIS WITH MULTIPLE FX: pain is better  DVT PROPHYLAXIS: On Xarelto, no bleeding noted CHRONIC PAIN: try increasing neurontin    LOS: 3 days   Katherine Mckay Katherine Mckay 12/30/2012, 8:27 AM

## 2012-12-31 ENCOUNTER — Inpatient Hospital Stay (HOSPITAL_COMMUNITY): Payer: Medicare Other

## 2012-12-31 LAB — COMPREHENSIVE METABOLIC PANEL
ALT: 10 U/L (ref 0–35)
Alkaline Phosphatase: 70 U/L (ref 39–117)
BUN: 24 mg/dL — ABNORMAL HIGH (ref 6–23)
CO2: 28 mEq/L (ref 19–32)
Chloride: 105 mEq/L (ref 96–112)
GFR calc Af Amer: 65 mL/min — ABNORMAL LOW (ref >90)
GFR calc non Af Amer: 56 mL/min — ABNORMAL LOW (ref >90)
Glucose, Bld: 100 mg/dL — ABNORMAL HIGH (ref 70–99)
Potassium: 4.3 mEq/L (ref 3.5–5.1)
Sodium: 140 mEq/L (ref 135–145)
Total Bilirubin: 0.4 mg/dL (ref 0.3–1.2)

## 2012-12-31 LAB — CBC WITH DIFFERENTIAL/PLATELET
Basophils Absolute: 0 10*3/uL (ref 0.0–0.1)
Basophils Relative: 0 % (ref 0–1)
Lymphocytes Relative: 25 % (ref 12–46)
MCHC: 31.7 g/dL (ref 30.0–36.0)
Monocytes Absolute: 0.7 10*3/uL (ref 0.1–1.0)
Neutro Abs: 3.2 10*3/uL (ref 1.7–7.7)
Platelets: 198 10*3/uL (ref 150–400)
RDW: 13.9 % (ref 11.5–15.5)
WBC: 5.1 10*3/uL (ref 4.0–10.5)

## 2012-12-31 MED ORDER — POLYETHYLENE GLYCOL 3350 17 G PO PACK
17.0000 g | PACK | Freq: Every day | ORAL | Status: DC
  Administered 2012-12-31 – 2013-01-02 (×3): 17 g via ORAL
  Filled 2012-12-31 (×3): qty 1

## 2012-12-31 NOTE — Progress Notes (Signed)
ANTIBIOTIC CONSULT NOTE - Follow Up  Pharmacy Consult for Primaxin Indication: pneumonia  Allergies  Allergen Reactions  . Celebrex (Celecoxib) Shortness Of Breath    No voiding  . Smz-Tmp Ds (Sulfamethoxazole W/Trimethoprim (Co-Trimoxazole)) Rash  . Ceclor (Cefaclor) Other (See Comments)    Stomach pain  . Ciprofloxacin Hcl     Possible GI Bleed.  . Diclofenac Sodium     Pt doesn't remember reaction  . Doxycycline     Stomach pains.  . Flagyl (Metronidazole Hcl)     Vaginal rash  . Hydrocodone     Stomach pain  . Iohexol      Code: HIVES, Desc: pt states she breaks out in hives and sneezes 10/01/08   Desc: STATES SHE ONLY REACTS BY SNEEZING   . Prilosec (Omeprazole) Other (See Comments)    Causes ulcers in stomach and esophagus  . Tylenol (Acetaminophen) Other (See Comments)    Liver damage  . Penicillins Rash    Patient Measurements: Height: 5\' 2"  (157.5 cm) Weight: 131 lb 9.8 oz (59.7 kg) IBW/kg (Calculated) : 50.1  Vital Signs: Temp: 97.6 F (36.4 C) (05/21 0500) Temp src: Oral (05/21 0500) BP: 153/61 mmHg (05/21 0500) Pulse Rate: 93 (05/21 0500) Intake/Output from previous day: 05/20 0701 - 05/21 0700 In: 1080 [P.O.:480; I.V.:600] Out: 650 [Urine:650] Intake/Output from this shift:    Labs:  Recent Labs  12/29/12 0355 12/30/12 0400 12/31/12 0345  WBC 6.5 5.1 5.1  HGB 15.3* 12.9 13.5  PLT 206 205 198  CREATININE 1.25* 1.03 1.00   Estimated Creatinine Clearance: 42 ml/min (by C-G formula based on Cr of 1). Normalized crcl 64ml/min/1.73m2  Microbiology: No results found for this or any previous visit (from the past 720 hour(s)).  Medical History: Past Medical History  Diagnosis Date  . Polio   . Osteoporosis     multiple compression fractures  . Crohn's disease     since 1966 on chronic steroids s/p ileal resection, fistula repair  . Brain tumor   . Migraines   . Hypercholesteremia   . Hypertension   . GERD (gastroesophageal reflux  disease)   . Anxiety   . Depression   . Emphysema   . Macular degeneration   . Cataract   . Arthritis   . Pernicious anemia   . Primary adrenal deficiency   . COPD (chronic obstructive pulmonary disease)   . Neuropathy, idiopathic   . Anticoagulated on warfarin     Hx of DVT, PE  . Deafness in right ear     Assessment: 42 yof with severe COPD presented 5/17 with back pain, headache.  Pt reported she was treated with Ciprofloxacin a few weeks ago for presumed pneumonia. Head CT negative, CXR suspicious for PNA, abd film concerning for ileus vs obstruction.     Afebrile, WBC down to wnl, Scr 1 for CG CrCl 42 ml/min, normalized CrCl of 60 ml/min.   No cultures ordered  Day # 4 Primaxin 250mg  IV q6h and Azithromycin 500mg  PO daily per MD    Plan:   Continue Primaxin 250 mg IV q6h  Continue to monitor labs and vitals and adjust dose as necessary  Gwen Her PharmD  [phone] 12/31/2012 9:01 AM

## 2012-12-31 NOTE — Progress Notes (Signed)
Subjective: Had some pain with swallowing yesterday, resolved. No abd pain this AM. Back is still doing better than before. Neck pain still improved. No chills or sweats. No BM yet  Objective: Vital signs in last 24 hours: Temp:  [97.5 F (36.4 C)-97.8 F (36.6 C)] 97.6 F (36.4 C) (05/21 0500) Pulse Rate:  [61-93] 93 (05/21 0500) Resp:  [18-20] 20 (05/21 0500) BP: (124-153)/(49-77) 153/61 mmHg (05/21 0500) SpO2:  [92 %-97 %] 94 % (05/21 0500) Weight:  [59.7 kg (131 lb 9.8 oz)] 59.7 kg (131 lb 9.8 oz) (05/21 0500)  Intake/Output from previous day: 05/20 0701 - 05/21 0700 In: 1080 [P.O.:480; I.V.:600] Out: 650 [Urine:650] Intake/Output this shift:    General: no distress, lying flat, O2 in place. Lungs distant but no wheeze, ht regular. abd a bit doughy with better BS's. Awake, alert  Lab Results   Recent Labs  12/30/12 0400 12/31/12 0345  WBC 5.1 5.1  RBC 4.19 4.35  HGB 12.9 13.5  HCT 40.6 42.6  MCV 96.9 97.9  MCH 30.8 31.0  RDW 14.0 13.9  PLT 205 198    Recent Labs  12/30/12 0400 12/31/12 0345  NA 136 140  K 4.7 4.3  CL 102 105  CO2 27 28  GLUCOSE 83 100*  BUN 26* 24*  CREATININE 1.03 1.00  CALCIUM 8.5 9.0    Studies/Results: No results found.  Scheduled Meds: . acidophilus  1 capsule Oral Daily  . amLODipine  2.5 mg Oral QHS  . antiseptic oral rinse  15 mL Mouth Rinse BID  . atorvastatin  40 mg Oral QPM  . azithromycin  500 mg Oral Daily  . DULoxetine  60 mg Oral q morning - 10a  . famotidine  20 mg Oral BID  . fentaNYL  75 mcg Transdermal QODAY  . ferrous sulfate  325 mg Oral TID WC  . gabapentin  300 mg Oral BID  . imipenem-cilastatin  250 mg Intravenous Q6H  . mometasone-formoterol  2 puff Inhalation BID  . nadolol  20 mg Oral q morning - 10a  . oxybutynin  10 mg Oral QHS  . potassium chloride  10 mEq Oral BID  . predniSONE  10 mg Oral q morning - 10a  . Rivaroxaban  20 mg Oral QPM  . tiotropium  18 mcg Inhalation q morning - 10a    Continuous Infusions: . sodium chloride 75 mL/hr at 12/31/12 0356   PRN Meds:ALPRAZolam, HYDROmorphone (DILAUDID) injection, ondansetron (ZOFRAN) IV, ondansetron, promethazine, traMADol  Assessment/Plan:  PNEUMONIA: doing pretty well. Continue Abx . Check XR pending BACK PAIN: staying much better overall  ILEUS: seems to be improving , advance diet add some miralax today COPD continue O2  HYPERTENSION: BP sl better  RENAL INSUFFICIENCY : increase fluids. Cr better today  DEPRESSION: continue Rx  OSTEOPOROSIS WITH MULTIPLE FX: pain is better  DVT PROPHYLAXIS: On Xarelto, no bleeding noted  CHRONIC PAIN: increased neurontin tolerated    LOS: 4 days   Demon Volante [name] 12/31/2012, 7:09 AM

## 2013-01-01 LAB — COMPREHENSIVE METABOLIC PANEL
ALT: 10 U/L (ref 0–35)
AST: 18 U/L (ref 0–37)
Albumin: 2.3 g/dL — ABNORMAL LOW (ref 3.5–5.2)
CO2: 32 mEq/L (ref 19–32)
Calcium: 9.3 mg/dL (ref 8.4–10.5)
Chloride: 103 mEq/L (ref 96–112)
Creatinine, Ser: 1 mg/dL (ref 0.50–1.10)
GFR calc non Af Amer: 56 mL/min — ABNORMAL LOW (ref >90)
Sodium: 139 mEq/L (ref 135–145)

## 2013-01-01 LAB — CBC WITH DIFFERENTIAL/PLATELET
Basophils Relative: 0 % (ref 0–1)
Eosinophils Absolute: 0 10*3/uL (ref 0.0–0.7)
Eosinophils Relative: 0 % (ref 0–5)
Hemoglobin: 14.1 g/dL (ref 12.0–15.0)
Lymphs Abs: 1.5 10*3/uL (ref 0.7–4.0)
MCH: 30.9 pg (ref 26.0–34.0)
MCHC: 30.7 g/dL (ref 30.0–36.0)
MCV: 100.7 fL — ABNORMAL HIGH (ref 78.0–100.0)
Monocytes Absolute: 0.6 10*3/uL (ref 0.1–1.0)
Monocytes Relative: 13 % — ABNORMAL HIGH (ref 3–12)
RBC: 4.56 MIL/uL (ref 3.87–5.11)

## 2013-01-01 MED ORDER — BISACODYL 10 MG RE SUPP
10.0000 mg | Freq: Every day | RECTAL | Status: DC | PRN
  Administered 2013-01-01: 10 mg via RECTAL
  Filled 2013-01-01: qty 1

## 2013-01-01 NOTE — Progress Notes (Signed)
Subjective: Had a pretty good night. Still very weak and unsteady when up. Still no BM despite cathartics. Pt reports dulcolax supp works. Breathing is pretty good, less cough. Back pain is doing well   Objective: Vital signs in last 24 hours: Temp:  [97.5 F (36.4 C)-98 F (36.7 C)] 97.7 F (36.5 C) (05/22 0453) Pulse Rate:  [58-74] 58 (05/22 0453) Resp:  [16-20] 16 (05/22 0453) BP: (141-154)/(64-74) 153/73 mmHg (05/22 0453) SpO2:  [94 %-100 %] 100 % (05/22 0453) Weight:  [60.238 kg (132 lb 12.8 oz)] 60.238 kg (132 lb 12.8 oz) (05/22 0453)  Intake/Output from previous day: 05/21 0701 - 05/22 0700 In: -  Out: 1850 [Urine:1850] Intake/Output this shift:    General: no distress, min wheeze, distant breath sounds, ht regular distant. abd sl distended, hypoactive BS's  Lab Results   Recent Labs  12/31/12 0345 01/01/13 0435  WBC 5.1 4.9  RBC 4.35 4.56  HGB 13.5 14.1  HCT 42.6 45.9  MCV 97.9 100.7*  MCH 31.0 30.9  RDW 13.9 14.0  PLT 198 213    Recent Labs  12/31/12 0345 01/01/13 0435  NA 140 139  K 4.3 4.4  CL 105 103  CO2 28 32  GLUCOSE 100* 87  BUN 24* 16  CREATININE 1.00 1.00  CALCIUM 9.0 9.3    Studies/Results: Dg Chest 2 View  12/31/2012   *RADIOLOGY REPORT*  Clinical Data: Follow up pneumonia  CHEST - 2 VIEW  Comparison: 12/08/2012  Findings: Heart size appears normal.  There is a right pleural effusion and mild diffuse interstitial edema which appear increased from previous exam.  Worsening aeration to the right lung base noted.  There are old healed right posterior rib fractures noted.  IMPRESSION:  1.  Mild congestive heart failure. 2.  Right base airspace consolidation compatible with pneumonia.   Original Report Authenticated By: Signa Kell, M.D.    Scheduled Meds: . acidophilus  1 capsule Oral Daily  . amLODipine  2.5 mg Oral QHS  . antiseptic oral rinse  15 mL Mouth Rinse BID  . atorvastatin  40 mg Oral QPM  . DULoxetine  60 mg Oral q morning  - 10a  . famotidine  20 mg Oral BID  . fentaNYL  75 mcg Transdermal QODAY  . ferrous sulfate  325 mg Oral TID WC  . gabapentin  300 mg Oral BID  . imipenem-cilastatin  250 mg Intravenous Q6H  . mometasone-formoterol  2 puff Inhalation BID  . nadolol  20 mg Oral q morning - 10a  . oxybutynin  10 mg Oral QHS  . polyethylene glycol  17 g Oral Daily  . potassium chloride  10 mEq Oral BID  . predniSONE  10 mg Oral q morning - 10a  . Rivaroxaban  20 mg Oral QPM  . tiotropium  18 mcg Inhalation q morning - 10a   Continuous Infusions:  PRN Meds:ALPRAZolam, bisacodyl, HYDROmorphone (DILAUDID) injection, ondansetron (ZOFRAN) IV, ondansetron, promethazine, traMADol  Assessment/Plan  PNEUMONIA: doing pretty well. Continue Abx .XR sl better. ? Some fluid, reduce fluids. Stop zithromax BACK PAIN: staying much better overall  ILEUS: no BM yet, try dulcolax COPD continue O2  HYPERTENSION: BP sl better  RENAL INSUFFICIENCY : increase fluids. Cr better today  DEPRESSION: continue Rx  OSTEOPOROSIS WITH MULTIPLE FX: pain is better  DVT PROPHYLAXIS: On Xarelto, no bleeding noted  CHRONIC PAIN: increased neurontin tolerated Disp: if we can mobilize and get BM's working, home to get home tomorrow :  LOS: 5 days   Katherine Mckay Katherine Mckay 01/01/2013, 8:11 AM

## 2013-01-01 NOTE — Progress Notes (Signed)
Pt. ambulated 50' down the hall today x2 without any shortness of breath or other adverse complications.

## 2013-01-02 LAB — COMPREHENSIVE METABOLIC PANEL
Albumin: 2.3 g/dL — ABNORMAL LOW (ref 3.5–5.2)
Alkaline Phosphatase: 68 U/L (ref 39–117)
BUN: 19 mg/dL (ref 6–23)
Calcium: 9.1 mg/dL (ref 8.4–10.5)
Creatinine, Ser: 1 mg/dL (ref 0.50–1.10)
GFR calc Af Amer: 65 mL/min — ABNORMAL LOW (ref >90)
Glucose, Bld: 90 mg/dL (ref 70–99)
Total Protein: 5.5 g/dL — ABNORMAL LOW (ref 6.0–8.3)

## 2013-01-02 LAB — CBC WITH DIFFERENTIAL/PLATELET
Basophils Absolute: 0 10*3/uL (ref 0.0–0.1)
HCT: 42.3 % (ref 36.0–46.0)
Hemoglobin: 13.6 g/dL (ref 12.0–15.0)
Lymphocytes Relative: 25 % (ref 12–46)
Lymphs Abs: 1.6 10*3/uL (ref 0.7–4.0)
Monocytes Absolute: 0.7 10*3/uL (ref 0.1–1.0)
Neutro Abs: 4.1 10*3/uL (ref 1.7–7.7)
RBC: 4.29 MIL/uL (ref 3.87–5.11)
RDW: 13.8 % (ref 11.5–15.5)
WBC: 6.4 10*3/uL (ref 4.0–10.5)

## 2013-01-02 MED ORDER — CEFUROXIME AXETIL 500 MG PO TABS
500.0000 mg | ORAL_TABLET | Freq: Two times a day (BID) | ORAL | Status: DC
  Filled 2013-01-02 (×2): qty 1

## 2013-01-02 MED ORDER — PREDNISONE 10 MG PO TABS: 10.0000 mg | ORAL_TABLET | Freq: Every morning | ORAL | Status: DC

## 2013-01-02 MED ORDER — POLYETHYLENE GLYCOL 3350 17 G PO PACK: 17.0000 g | PACK | Freq: Every day | ORAL | Status: DC

## 2013-01-02 MED ORDER — GABAPENTIN 300 MG PO CAPS: 300.0000 mg | ORAL_CAPSULE | Freq: Two times a day (BID) | ORAL | Status: DC

## 2013-01-02 MED ORDER — FLEET ENEMA 7-19 GM/118ML RE ENEM
1.0000 | ENEMA | Freq: Once | RECTAL | Status: AC
  Administered 2013-01-02: 09:00:00 via RECTAL
  Filled 2013-01-02: qty 1

## 2013-01-02 MED ORDER — CEFUROXIME AXETIL 500 MG PO TABS: 500.0000 mg | ORAL_TABLET | Freq: Two times a day (BID) | ORAL | Status: DC

## 2013-01-02 NOTE — Progress Notes (Signed)
All discharge forms/teaching provided. Patient expressed understanding. Will go home with spouse.

## 2013-01-02 NOTE — Discharge Summary (Signed)
DISCHARGE SUMMARY  Katherine CABRIALES  MR#: [id number]  DOB:10-25-1940  Date of Admission: 12/27/2012 Date of Discharge: 01/02/2013  Attending Physician:Tijuana Scheidegger [name]  Patient's JYN:[id number] Katherine Diener, MD  Consults:  none  Discharge Diagnoses: Principal Problem:   Pneumonia Osteoporosis with multiple compression fractures Back pain, improved Advanced COPD Impaired glucose tolerance Hypertension Hyperlipidemia Crohn's disease Chronic pain syndrome Neck pain, improved Depression History of pulmonary embolism Anemia Neuropathic pain Dehydration with renal insufficiency Mild ileus Adult failure to thrive with protein calorie malnutrition Discharge Medications:   Medication List    TAKE these medications       ALIGN 4 MG Caps  Take 1 capsule by mouth every morning.     ALPRAZolam 0.25 MG tablet  Commonly known as:  XANAX  Take 0.25 mg by mouth 3 (three) times daily as needed for anxiety. For anxiety.     amLODipine 2.5 MG tablet  Commonly known as:  NORVASC  Take 2.5 mg by mouth at bedtime.     atorvastatin 40 MG tablet  Commonly known as:  LIPITOR  Take 1 tablet by mouth every evening.     calcium carbonate 600 MG Tabs  Commonly known as:  OS-CAL  Take 600 mg by mouth every evening.     cefUROXime 500 MG tablet  Commonly known as:  CEFTIN  Take 1 tablet (500 mg total) by mouth 2 (two) times daily with a meal.     DULoxetine 60 MG capsule  Commonly known as:  CYMBALTA  Take 60 mg by mouth every morning.     fentaNYL 75 MCG/HR  Commonly known as:  DURAGESIC - dosed mcg/hr  Place 1 patch onto the skin every other day.     ferrous sulfate 325 (65 FE) MG tablet  Take 1 tablet (325 mg total) by mouth 3 (three) times daily with meals.     gabapentin 300 MG capsule  Commonly known as:  NEURONTIN  Take 1 capsule (300 mg total) by mouth 2 (two) times daily.     mometasone-formoterol 100-5 MCG/ACT Aero  Commonly known as:  DULERA  Inhale 2 puffs into  the lungs 2 (two) times daily.     nadolol 20 MG tablet  Commonly known as:  CORGARD  Take 20 mg by mouth every morning.     polyethylene glycol packet  Commonly known as:  MIRALAX / GLYCOLAX  Take 17 g by mouth daily.     potassium chloride 10 MEQ CR tablet  Commonly known as:  KLOR-CON  Take 5-10 mEq by mouth every morning. Take 1 tablet every day except take 1/2 tablet on Saturday and Sunday     predniSONE 10 MG tablet  Commonly known as:  DELTASONE  Take 1 tablet (10 mg total) by mouth every morning.     promethazine 25 MG tablet  Commonly known as:  PHENERGAN  Take 25 mg by mouth every 6 (six) hours as needed for nausea. Nausea     ranitidine 300 MG capsule  Commonly known as:  ZANTAC  Take 300 mg by mouth 2 (two) times daily.     Rivaroxaban 20 MG Tabs  Commonly known as:  XARELTO  Take 1 tablet by mouth every evening.     tiotropium 18 MCG inhalation capsule  Commonly known as:  SPIRIVA  Place 18 mcg into inhaler and inhale every morning.     tolterodine 2 MG tablet  Commonly known as:  DETROL  Take 2 mg by mouth every morning.  traMADol 50 MG tablet  Commonly known as:  ULTRAM  Take 100 mg by mouth every 8 (eight) hours as needed for pain.        Hospital Procedures: Dg Chest 2 View  12/31/2012   *RADIOLOGY REPORT*  Clinical Data: Follow up pneumonia  CHEST - 2 VIEW  Comparison: 12/08/2012  Findings: Heart size appears normal.  There is a right pleural effusion and mild diffuse interstitial edema which appear increased from previous exam.  Worsening aeration to the right lung base noted.  There are old healed right posterior rib fractures noted.  IMPRESSION:  1.  Mild congestive heart failure. 2.  Right base airspace consolidation compatible with pneumonia.   Original Report Authenticated By: Signa Kell, M.D.   Dg Thoracic Spine 2 View  12/28/2012   *RADIOLOGY REPORT*  Clinical Data: Back pain and headache  THORACIC SPINE - 2 VIEW  Comparison:  12/08/2012  Findings:  Bones are markedly osteopenic which significantly diminishes bone detail.  Again noted are multilevel compression fractures identified throughout the thoracic spine with resultant kyphosis deformity.  When compared with previous exam the overall appearance is not significantly changed.  There is a calcified atherosclerotic disease affecting the thoracic aorta.  IMPRESSION:  1. Multilevel compression deformities throughout the thoracic spine with associated kyphosis.   Original Report Authenticated By: Signa Kell, M.D.   Ct Head Wo Contrast  12/28/2012   *RADIOLOGY REPORT*  Clinical Data: Back pain and headache  CT HEAD WITHOUT CONTRAST  Technique:  Contiguous axial images were obtained from the base of the skull through the vertex without contrast.  Comparison: 04/08/2012  Findings: There is diffuse patchy low density throughout the subcortical and periventricular white matter consistent with chronic small vessel ischemic change.  Chronic bilateral basal ganglia lacunar infarcts appears similar to previous exam  There is prominence of the sulci and ventricles consistent with brain atrophy.  There is no evidence for acute brain infarct, hemorrhage or mass.  The paranasal sinuses appear clear.  The mastoid air cells are clear.  The skull is intact.  IMPRESSION:  1.  Small vessel ischemic disease and brain atrophy. 2.  No acute intracranial abnormalities   Original Report Authenticated By: Signa Kell, M.D.   Ct Chest Wo Contrast  12/08/2012   *RADIOLOGY REPORT*  Clinical Data: Evaluate pulmonary infiltrates after antibiotic therapy.  Productive cough.  CT CHEST WITHOUT CONTRAST  Technique:  Multidetector CT imaging of the chest was performed following the standard protocol without IV contrast.  Comparison: 01/29/2012 and 03/01/2006.  Findings: No pathologically enlarged mediastinal or axillary lymph nodes.  Hilar regions are difficult to definitively evaluate without IV contrast.   Atherosclerotic calcification of the arterial vasculature, including coronary arteries.  Heart size normal.  No pericardial effusion.  Moderate centrilobular emphysema.  A 6 mm irregular nodular density in the posterior segment right upper lobe (image 16) is unchanged from 01/29/2012 but not well seen on 03/01/2006.  4 mm right upper lobe nodule (image 21), stable from 03/01/2006.  There are additional scattered areas of mild bronchiectasis and ground-glass (example image 21, right upper lobe), new from prior exams. Mild bronchiectasis in the lingula.  No subpleural reticulation, architectural distortion or honeycombing. No air trapping on inspiratory expiratory imaging.  No pleural fluid.  Airway is unremarkable.  Incidental imaging of the upper abdomen shows no acute findings. No worrisome lytic or sclerotic lesions.  Old bilateral rib fractures.  Multiple lower thoracic and upper lumbar compression fractures are unchanged, with resultant  kyphosis.  IMPRESSION:  1.  No evidence of interstitial lung disease. 2.  Centrilobular emphysema with scattered areas of mild bronchiectasis and peribronchovascular ground-glass / nodular density, predominantly in the right upper lobe. An infectious/post infectious etiology is favored.   Original Report Authenticated By: Leanna Battles, M.D.   Dg Abd Acute W/chest  12/27/2012   *RADIOLOGY REPORT*  Clinical Data: Back pain.  COPD.  Emphysema.  Crohn's disease.  ACUTE ABDOMEN SERIES (ABDOMEN 2 VIEW & CHEST 1 VIEW)  Comparison: Multiple exams, including 12/08/2012 and 08/13/2011  Findings: Prominent kyphosis with multiple thoracic compression fractures and multiple old bilateral rib fractures.  Patchy airspace opacity observed at the right lung base.  Chest radiograph prominently rotated to the right.  Tortuous thoracic aorta.  No free intraperitoneal gas observed on the lateral decubitus view. There are air-fluid levels in dilated loops of small bowel and in the gas filled  ascending colon.  Atherosclerosis noted.  Lumbar compression fractures include L2.  IMPRESSION:  1.  Dilated loops of small bowel with air-fluid levels, and abnormal but nonspecific pattern which could reflect ileus or obstruction. 2.  Right lower lobe patchy airspace opacity, query pneumonia. 3.  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  Multiple old bilateral rib fractures.   Original Report Authenticated By: Gaylyn Rong, M.D.    History of Present Illness: There is a 72 year old white female presented with cough and back pain. She was found to have a right lower lobe pneumonia. She is treated with broad-spectrum antibiotics and has done well from an infectious standpoint. [name] had no fever and several days. Her risk for status is done moderately well. She does have advanced COPD. Her back pain is improved dramatically since admission. She did have one episode of neck and ear pain has resolved. Her bowels and slow movement of work a little bit. She said no nausea and vomiting diet has been advanced. There was a question of mild ileus by exam she has good bowel sounds. She has mobilized a bit. Her chronic pain issues are at baseline. Her labs look quite a bit better after hydration. She feels comfortable going home. Importantly she has tolerated imipenem while here. She had previously tolerated cephalosporins despite a stated penicillin allergy. I believe we get by with Ceftin orally based on this history.  Hospital Course: #1 pneumonia: Patient will finish out for more days of therapy. #2 back pain: This is under control the present time with current pain medications #3 ileus: This appears to be doing well. #4 osteoporosis with multiple compression fractures: I doubt a new fractures is present #5 COPD: She has oxygen at home for this. [name] on a trial of Spiriva for the next couple of weeks and will see Dr. Shelle Iron back #6 dehydration: This is improved #7  hypertension: Blood pressure looks good on current medications #8 hyperlipidemia: Remains on therapy. #9 Crohn's: This is clinically silent at the present time #10 adult failure to thrive with protein calorie malnutrition: This is been going on for quite some time. [name] encouraged to continue her supplements. Albumin is noted to be only 2.3.  Day of Discharge Exam BP 113/77  Pulse 53  Temp(Src) 98.2 F (36.8 C) (Oral)  Resp 16  Ht 5\' 2"  (1.575 m)  Wt 60.238 kg (132 lb 12.8 oz)  BMI 24.28 kg/m2  SpO2 92%  Physical Exam: General appearance: Frail pale white female lying quietly in no distress oxygen is infusing. Face is symmetric she is edentulous  and oral membranes are moist. Eyes: no scleral icterus  Resp: Distant with no wheezing. No accessory muscles are in use. Marked kyphosis is present. Cardio: Regular with systolic murmur. GI: Minimally distended soft, non-tender; bowel sounds are good with no rushes.  no masses,  no organomegaly Extremities: Diminished pulses with no edema Neurologic exam: Patient is awake alert and mentating well speech is clear hearing is diminished she is globally weak. She is in good spirits. Skin is warm and dry with marked thinness and quite a bit of bruising.  Discharge Labs:  Recent Labs  01/01/13 0435 01/02/13 0420  NA 139 142  K 4.4 4.0  CL 103 103  CO2 32 35*  GLUCOSE 87 90  BUN 16 19  CREATININE 1.00 1.00  CALCIUM 9.3 9.1    Recent Labs  01/01/13 0435 01/02/13 0420  AST 18 22  ALT 10 13  ALKPHOS 71 68  BILITOT 0.4 0.3  PROT 5.9* 5.5*  ALBUMIN 2.3* 2.3*    Recent Labs  01/01/13 0435 01/02/13 0420  WBC 4.9 6.4  NEUTROABS 2.8 4.1  HGB 14.1 13.6  HCT 45.9 42.3  MCV 100.7* 98.6  PLT 213 243        Discharge instructions:      Future Appointments Provider Department Dept Phone   04/10/2013 10:30 AM Barbaraann Share, MD [facility] [phone]      Disposition: To home  Follow-up Appts: Follow-up  with Dr. [name] at [facility] in 2 weeks.  Call for appointment.  Condition on Discharge: Improved  Tests Needing Follow-up: None  Signed: Okla Qazi [name] 01/02/2013, 8:15 AM

## 2013-01-07 ENCOUNTER — Telehealth: Payer: Self-pay | Admitting: Pulmonary Disease

## 2013-01-07 NOTE — Telephone Encounter (Signed)
Pt's spouse returned call.  Holly D Pryor ° °

## 2013-01-07 NOTE — Telephone Encounter (Signed)
Pt was started on Spiriva at last ov on 12-10-11 and was then admitted to [facility] on 12-27-12 for pneumonia.  Was treated by Dr [name] and is due to f/u with him next week. Pt's husband requested that Dr Shelle Iron be called in to see pt but was unable to get them to do this.   Pt's husband has concerns that maybe Spriva could have somehow caused the pneumonia and is not sure if she should continue taking this.  Please advise.

## 2013-01-07 NOTE — Telephone Encounter (Signed)
Please refer to prior message regarding these concerns.  Will close this message.

## 2013-01-07 NOTE — Telephone Encounter (Signed)
LMOMTCB x 1 

## 2013-01-07 NOTE — Telephone Encounter (Signed)
Let them know very unlikely spiriva caused the pna.  There are some other pulmonary inhalers that have been associated with rare occurrence of pna.  I would be happy to see her as posthospital to evaluate her further.  Would just need to make an apptm.

## 2013-01-07 NOTE — Telephone Encounter (Signed)
Spoke with pt's spouse and notified of recs per [facility] He verbalized understanding  Will have the pt keep appt 04/10/13 and call for a sooner one if needed

## 2013-01-19 ENCOUNTER — Telehealth: Payer: Self-pay | Admitting: Pulmonary Disease

## 2013-01-19 NOTE — Telephone Encounter (Signed)
I can not see where patient was called.  Spoke with patients spouse made him aware of this. He verbalized understanding and would like to give Dr. Shelle Iron update: Patients spouse states patient is not getting any better on spiriva and will no longer continue it at this time Patient was scheduled to come in 04/10/13 and would like to cancel this appt, he states they will follow up with their primary unless patients condition changes Dennard Nip would like to thank Dr. Shelle Iron for his evaluation of patient.  Will forward to Dr. Shelle Iron as Lorain Childes

## 2013-01-19 NOTE — Telephone Encounter (Signed)
Noted  

## 2013-04-10 ENCOUNTER — Ambulatory Visit: Payer: Medicare Other | Admitting: Pulmonary Disease

## 2013-05-27 ENCOUNTER — Ambulatory Visit: Payer: Self-pay

## 2013-06-03 ENCOUNTER — Other Ambulatory Visit: Payer: Self-pay | Admitting: Orthopedic Surgery

## 2013-06-03 DIAGNOSIS — T148XXA Other injury of unspecified body region, initial encounter: Secondary | ICD-10-CM

## 2013-06-04 ENCOUNTER — Ambulatory Visit
Admission: RE | Admit: 2013-06-04 | Discharge: 2013-06-04 | Disposition: A | Discharge status: Home / Self Care | Payer: Medicare Other | Source: Ambulatory Visit | Attending: Orthopedic Surgery | Admitting: Orthopedic Surgery

## 2013-06-04 DIAGNOSIS — T148XXA Other injury of unspecified body region, initial encounter: Secondary | ICD-10-CM

## 2013-11-10 ENCOUNTER — Ambulatory Visit (INDEPENDENT_AMBULATORY_CARE_PROVIDER_SITE_OTHER): Payer: Medicare Other

## 2013-11-10 VITALS — BP 149/78 | HR 70 | Resp 12

## 2013-11-10 DIAGNOSIS — M2040 Other hammer toe(s) (acquired), unspecified foot: Secondary | ICD-10-CM

## 2013-11-10 DIAGNOSIS — B351 Tinea unguium: Secondary | ICD-10-CM

## 2013-11-10 DIAGNOSIS — M79609 Pain in unspecified limb: Secondary | ICD-10-CM

## 2013-11-10 DIAGNOSIS — Q828 Other specified congenital malformations of skin: Secondary | ICD-10-CM

## 2013-11-10 NOTE — Progress Notes (Signed)
   Subjective:    Patient ID: Katherine Mckay, female    DOB: May 29, 1941, 73 y.o.   MRN: [id number]  HPI   TOENAILS NEED TO BE TRIM.   Review of Systems  Constitutional: Negative.   HENT: Negative.   Eyes: Negative.   Respiratory: Negative.   Cardiovascular: Negative.   Gastrointestinal: Negative.   Endocrine: Negative.   Genitourinary: Negative.   Skin: Negative.   Allergic/Immunologic: Negative.   Neurological: Positive for numbness.  Hematological: Negative.   Psychiatric/Behavioral: Negative.        Objective:   Physical Exam Lotion objective findings as follows vascular status is intact with pedal pulses palpable DP and PT plus one over 4 bilateral capillary refill time 3 seconds all digits skin temperature warm to cool turgor diminished there is no edema were pallor or varicosities noted. Neurologically epicritic and proprioceptive sensations intact and symmetric bilateral patient does have some paresthesia and abnormal sensations burning in her feet hypersensitivity as well as some numbness up in the toe area. Neurologically skin color pigment normal hair growth absent nails criptotic incurvated friable with discoloration. There brittle painful tender in several months or more in 6 months as they've been cut appropriately. No secondary infection at this time patient is rigid digital contractures with hammertoe deformities in particular has a keratotic lesion lateral third digit right foot and interface the third and fourth toe grating of painful keratoses or 4 keratoses which is debrided there is no open wounds or ulcerations noted no secondary infection is noted to Patient was hospitalized for lung. Time with multiple complications difficulties has severe osteopenia or osteoporosis is taking 40 004T the medication otherwise no other changes medications suspending last seen.      Assessment & Plan:  Assessment this time is onychomycosis painful mycotic nails with glucose  incurvation and dystrophy. Patient also has painful keratotic lesion second hammertoe deformity and 4 keratoses third toe left foot are right foot. The same keratotic lesions and lesion debridement multiple painful mycotic nails 1 through 4 bilateral debrided return for future palliative care in 3 months or as recommended  Harriet Masson DPM

## 2013-11-10 NOTE — Patient Instructions (Signed)
Corns and Calluses Corns are small areas of thickened skin that usually occur on the top, sides, or tip of a toe. They contain a cone-shaped core with a point that can press on a nerve below. This causes pain. Calluses are areas of thickened skin that usually develop on hands, fingers, palms, soles of the feet, and heels. These are areas that experience frequent friction or pressure. CAUSES  Corns are usually the result of rubbing (friction) or pressure from shoes that are too tight or do not fit properly. Calluses are caused by repeated friction and pressure on the affected areas. SYMPTOMS  A hard growth on the skin.  Pain or tenderness under the skin.  Sometimes, redness and swelling.  Increased discomfort while wearing tight-fitting shoes. DIAGNOSIS  Your caregiver can usually tell what the problem is by doing a physical exam. TREATMENT  Removing the cause of the friction or pressure is usually the only treatment needed. However, sometimes medicines can be used to help soften the hardened, thickened areas. These medicines include salicylic acid plasters and 12% ammonium lactate lotion. These medicines should only be used under the direction of your caregiver. HOME CARE INSTRUCTIONS   Try to remove pressure from the affected area.  You may wear donut-shaped corn pads to protect your skin.  You may use a pumice stone or nonmetallic nail file to gently reduce the thickness of a corn.  Wear properly fitted footwear.  If you have calluses on the hands, wear gloves during activities that cause friction.  If you have diabetes, you should regularly examine your feet. Tell your caregiver if you notice any problems with your feet. SEEK IMMEDIATE MEDICAL CARE IF:   You have increased pain, swelling, redness, or warmth in the affected area.  Your corn or callus starts to drain fluid or bleeds.  You are not getting better, even with treatment. Document Released: 05/05/2004 Document  Revised: 10/22/2011 Document Reviewed: 03/27/2011 [facility] Patient Information 2014 [place], [place]. Onychomycosis/Fungal Toenails  WHAT IS IT? An infection that lies within the keratin of your nail plate that is caused by a fungus.  WHY ME? Fungal infections affect all ages, sexes, races, and creeds.  There may be many factors that predispose you to a fungal infection such as age, coexisting medical conditions such as diabetes, or an autoimmune disease; stress, medications, fatigue, genetics, etc.  Bottom line: fungus thrives in a warm, moist environment and your shoes offer such a location.  IS IT CONTAGIOUS? Theoretically, yes.  You do not want to share shoes, nail clippers or files with someone who has fungal toenails.  Walking around barefoot in the same room or sleeping in the same bed is unlikely to transfer the organism.  It is important to realize, however, that fungus can spread easily from one nail to the next on the same foot.  HOW DO WE TREAT THIS?  There are several ways to treat this condition.  Treatment may depend on many factors such as age, medications, pregnancy, liver and kidney conditions, etc.  It is best to ask your doctor which options are available to you.  1. No treatment.   Unlike many other medical concerns, you can live with this condition.  However for many people this can be a painful condition and may lead to ingrown toenails or a bacterial infection.  It is recommended that you keep the nails cut short to help reduce the amount of fungal nail. 2. Topical treatment.  These range from herbal remedies to  prescription strength nail lacquers.  About 40-50% effective, topicals require twice daily application for approximately 9 to 12 months or until an entirely new nail has grown out.  The most effective topicals are medical grade medications available through physicians offices. 3. Oral antifungal medications.  With an 80-90% cure rate, the most common oral medication  requires 3 to 4 months of therapy and stays in your system for a year as the new nail grows out.  Oral antifungal medications do require blood work to make sure it is a safe drug for you.  A liver function panel will be performed prior to starting the medication and after the first month of treatment.  It is important to have the blood work performed to avoid any harmful side effects.  In general, this medication safe but blood work is required. 4. Laser Therapy.  This treatment is performed by applying a specialized laser to the affected nail plate.  This therapy is noninvasive, fast, and non-painful.  It is not covered by insurance and is therefore, out of pocket.  The results have been very good with a 80-95% cure rate.  The [facility] is the only practice in the area to offer this therapy. 5. Permanent Nail Avulsion.  Removing the entire nail so that a new nail will not grow back.

## 2013-11-15 ENCOUNTER — Emergency Department (HOSPITAL_COMMUNITY): Payer: Medicare Other

## 2013-11-15 ENCOUNTER — Encounter (HOSPITAL_COMMUNITY): Payer: Self-pay | Admitting: Emergency Medicine

## 2013-11-15 ENCOUNTER — Inpatient Hospital Stay (HOSPITAL_COMMUNITY)
Admission: EM | Admit: 2013-11-15 | Discharge: 2013-11-21 | DRG: 190 | Disposition: A | Discharge status: Skilled Nursing Facility | Payer: Medicare Other | Attending: Endocrinology | Admitting: Endocrinology

## 2013-11-15 DIAGNOSIS — E2740 Unspecified adrenocortical insufficiency: Secondary | ICD-10-CM

## 2013-11-15 DIAGNOSIS — J479 Bronchiectasis, uncomplicated: Secondary | ICD-10-CM

## 2013-11-15 DIAGNOSIS — Z888 Allergy status to other drugs, medicaments and biological substances status: Secondary | ICD-10-CM

## 2013-11-15 DIAGNOSIS — K219 Gastro-esophageal reflux disease without esophagitis: Secondary | ICD-10-CM | POA: Diagnosis present

## 2013-11-15 DIAGNOSIS — H3530 Unspecified macular degeneration: Secondary | ICD-10-CM | POA: Diagnosis present

## 2013-11-15 DIAGNOSIS — F329 Major depressive disorder, single episode, unspecified: Secondary | ICD-10-CM | POA: Diagnosis present

## 2013-11-15 DIAGNOSIS — G529 Cranial nerve disorder, unspecified: Secondary | ICD-10-CM | POA: Diagnosis present

## 2013-11-15 DIAGNOSIS — I10 Essential (primary) hypertension: Secondary | ICD-10-CM | POA: Diagnosis present

## 2013-11-15 DIAGNOSIS — Z8612 Personal history of poliomyelitis: Secondary | ICD-10-CM

## 2013-11-15 DIAGNOSIS — R269 Unspecified abnormalities of gait and mobility: Secondary | ICD-10-CM | POA: Diagnosis present

## 2013-11-15 DIAGNOSIS — J159 Unspecified bacterial pneumonia: Secondary | ICD-10-CM | POA: Diagnosis present

## 2013-11-15 DIAGNOSIS — Z9981 Dependence on supplemental oxygen: Secondary | ICD-10-CM

## 2013-11-15 DIAGNOSIS — J439 Emphysema, unspecified: Secondary | ICD-10-CM

## 2013-11-15 DIAGNOSIS — M542 Cervicalgia: Secondary | ICD-10-CM | POA: Diagnosis present

## 2013-11-15 DIAGNOSIS — F411 Generalized anxiety disorder: Secondary | ICD-10-CM | POA: Diagnosis present

## 2013-11-15 DIAGNOSIS — G8929 Other chronic pain: Secondary | ICD-10-CM | POA: Diagnosis present

## 2013-11-15 DIAGNOSIS — K5900 Constipation, unspecified: Secondary | ICD-10-CM | POA: Diagnosis present

## 2013-11-15 DIAGNOSIS — Z66 Do not resuscitate: Secondary | ICD-10-CM | POA: Diagnosis not present

## 2013-11-15 DIAGNOSIS — Z885 Allergy status to narcotic agent status: Secondary | ICD-10-CM

## 2013-11-15 DIAGNOSIS — I2782 Chronic pulmonary embolism: Secondary | ICD-10-CM | POA: Diagnosis present

## 2013-11-15 DIAGNOSIS — E46 Unspecified protein-calorie malnutrition: Secondary | ICD-10-CM | POA: Diagnosis present

## 2013-11-15 DIAGNOSIS — J449 Chronic obstructive pulmonary disease, unspecified: Secondary | ICD-10-CM

## 2013-11-15 DIAGNOSIS — Z86718 Personal history of other venous thrombosis and embolism: Secondary | ICD-10-CM

## 2013-11-15 DIAGNOSIS — Z87891 Personal history of nicotine dependence: Secondary | ICD-10-CM

## 2013-11-15 DIAGNOSIS — F3289 Other specified depressive episodes: Secondary | ICD-10-CM | POA: Diagnosis present

## 2013-11-15 DIAGNOSIS — J441 Chronic obstructive pulmonary disease with (acute) exacerbation: Secondary | ICD-10-CM

## 2013-11-15 DIAGNOSIS — E860 Dehydration: Secondary | ICD-10-CM | POA: Diagnosis present

## 2013-11-15 DIAGNOSIS — Z881 Allergy status to other antibiotic agents status: Secondary | ICD-10-CM

## 2013-11-15 DIAGNOSIS — Z79899 Other long term (current) drug therapy: Secondary | ICD-10-CM

## 2013-11-15 DIAGNOSIS — M549 Dorsalgia, unspecified: Secondary | ICD-10-CM | POA: Diagnosis present

## 2013-11-15 DIAGNOSIS — D649 Anemia, unspecified: Secondary | ICD-10-CM | POA: Diagnosis present

## 2013-11-15 DIAGNOSIS — E7800 Pure hypercholesterolemia, unspecified: Secondary | ICD-10-CM | POA: Diagnosis present

## 2013-11-15 DIAGNOSIS — H9190 Unspecified hearing loss, unspecified ear: Secondary | ICD-10-CM | POA: Diagnosis present

## 2013-11-15 DIAGNOSIS — E785 Hyperlipidemia, unspecified: Secondary | ICD-10-CM | POA: Diagnosis present

## 2013-11-15 DIAGNOSIS — K921 Melena: Secondary | ICD-10-CM | POA: Diagnosis present

## 2013-11-15 DIAGNOSIS — R300 Dysuria: Secondary | ICD-10-CM | POA: Diagnosis present

## 2013-11-15 DIAGNOSIS — J9620 Acute and chronic respiratory failure, unspecified whether with hypoxia or hypercapnia: Secondary | ICD-10-CM | POA: Diagnosis present

## 2013-11-15 DIAGNOSIS — Z7901 Long term (current) use of anticoagulants: Secondary | ICD-10-CM

## 2013-11-15 DIAGNOSIS — K5090 Crohn's disease, unspecified, without complications: Secondary | ICD-10-CM | POA: Diagnosis present

## 2013-11-15 DIAGNOSIS — M4000 Postural kyphosis, site unspecified: Secondary | ICD-10-CM | POA: Diagnosis present

## 2013-11-15 DIAGNOSIS — IMO0002 Reserved for concepts with insufficient information to code with codable children: Secondary | ICD-10-CM

## 2013-11-15 DIAGNOSIS — J9691 Respiratory failure, unspecified with hypoxia: Secondary | ICD-10-CM

## 2013-11-15 DIAGNOSIS — Z880 Allergy status to penicillin: Secondary | ICD-10-CM

## 2013-11-15 DIAGNOSIS — M810 Age-related osteoporosis without current pathological fracture: Secondary | ICD-10-CM | POA: Diagnosis present

## 2013-11-15 DIAGNOSIS — G894 Chronic pain syndrome: Secondary | ICD-10-CM | POA: Diagnosis present

## 2013-11-15 DIAGNOSIS — R627 Adult failure to thrive: Secondary | ICD-10-CM | POA: Diagnosis present

## 2013-11-15 DIAGNOSIS — K5909 Other constipation: Secondary | ICD-10-CM | POA: Diagnosis present

## 2013-11-15 LAB — TYPE AND SCREEN
ABO/RH(D): A NEG
Antibody Screen: NEGATIVE

## 2013-11-15 LAB — BLOOD GAS, ARTERIAL
Acid-Base Excess: 4.9 mmol/L — ABNORMAL HIGH (ref 0.0–2.0)
BICARBONATE: 31.2 meq/L — AB (ref 20.0–24.0)
Drawn by: 307971
O2 Content: 4 L/min
O2 Saturation: 94.3 %
PH ART: 7.381 (ref 7.350–7.450)
Patient temperature: 98.6
TCO2: 26.9 mmol/L (ref 0–100)
pCO2 arterial: 53.7 mmHg — ABNORMAL HIGH (ref 35.0–45.0)
pO2, Arterial: 74.5 mmHg — ABNORMAL LOW (ref 80.0–100.0)

## 2013-11-15 LAB — PRO B NATRIURETIC PEPTIDE: Pro B Natriuretic peptide (BNP): 1718 pg/mL — ABNORMAL HIGH (ref 0–125)

## 2013-11-15 LAB — BASIC METABOLIC PANEL
BUN: 14 mg/dL (ref 6–23)
CO2: 33 meq/L — AB (ref 19–32)
CREATININE: 1.06 mg/dL (ref 0.50–1.10)
Calcium: 10.1 mg/dL (ref 8.4–10.5)
Chloride: 92 mEq/L — ABNORMAL LOW (ref 96–112)
GFR calc Af Amer: 60 mL/min — ABNORMAL LOW (ref >90)
GFR calc non Af Amer: 52 mL/min — ABNORMAL LOW (ref >90)
Glucose, Bld: 103 mg/dL — ABNORMAL HIGH (ref 70–99)
Potassium: 3.5 mEq/L — ABNORMAL LOW (ref 3.7–5.3)
Sodium: 135 mEq/L — ABNORMAL LOW (ref 137–147)

## 2013-11-15 LAB — CBC
HCT: 48.6 % — ABNORMAL HIGH (ref 36.0–46.0)
Hemoglobin: 15.7 g/dL — ABNORMAL HIGH (ref 12.0–15.0)
MCH: 32.9 pg (ref 26.0–34.0)
MCHC: 32.3 g/dL (ref 30.0–36.0)
MCV: 101.9 fL — ABNORMAL HIGH (ref 78.0–100.0)
PLATELETS: 167 10*3/uL (ref 150–400)
RBC: 4.77 MIL/uL (ref 3.87–5.11)
RDW: 14.2 % (ref 11.5–15.5)
WBC: 10.8 10*3/uL — AB (ref 4.0–10.5)

## 2013-11-15 LAB — PROTIME-INR
INR: 1.7 — AB (ref 0.00–1.49)
Prothrombin Time: 19.5 seconds — ABNORMAL HIGH (ref 11.6–15.2)

## 2013-11-15 LAB — I-STAT TROPONIN, ED: TROPONIN I, POC: 0.01 ng/mL (ref 0.00–0.08)

## 2013-11-15 LAB — I-STAT CG4 LACTIC ACID, ED: Lactic Acid, Venous: 1.01 mmol/L (ref 0.5–2.2)

## 2013-11-15 LAB — POC OCCULT BLOOD, ED: Fecal Occult Bld: POSITIVE — AB

## 2013-11-15 MED ORDER — POTASSIUM CHLORIDE ER 10 MEQ PO TBCR
10.0000 meq | EXTENDED_RELEASE_TABLET | Freq: Every morning | ORAL | Status: DC
  Administered 2013-11-15 – 2013-11-16 (×2): 10 meq via ORAL
  Filled 2013-11-15 (×3): qty 1

## 2013-11-15 MED ORDER — ALBUTEROL SULFATE (2.5 MG/3ML) 0.083% IN NEBU
2.5000 mg | INHALATION_SOLUTION | Freq: Four times a day (QID) | RESPIRATORY_TRACT | Status: DC
  Administered 2013-11-16 – 2013-11-20 (×14): 2.5 mg via RESPIRATORY_TRACT
  Filled 2013-11-15 (×14): qty 3

## 2013-11-15 MED ORDER — DULOXETINE HCL 60 MG PO CPEP
60.0000 mg | ORAL_CAPSULE | Freq: Every morning | ORAL | Status: DC
  Administered 2013-11-15 – 2013-11-21 (×7): 60 mg via ORAL
  Filled 2013-11-15 (×7): qty 1

## 2013-11-15 MED ORDER — AMLODIPINE BESYLATE 2.5 MG PO TABS
2.5000 mg | ORAL_TABLET | Freq: Every day | ORAL | Status: DC
  Administered 2013-11-15 – 2013-11-20 (×6): 2.5 mg via ORAL
  Filled 2013-11-15 (×7): qty 1

## 2013-11-15 MED ORDER — ALIGN 4 MG PO CAPS
1.0000 | ORAL_CAPSULE | Freq: Every morning | ORAL | Status: DC
  Administered 2013-11-15 – 2013-11-21 (×7): 4 mg via ORAL
  Filled 2013-11-15 (×7): qty 1

## 2013-11-15 MED ORDER — OXYCODONE-ACETAMINOPHEN 5-325 MG PO TABS
1.0000 | ORAL_TABLET | Freq: Four times a day (QID) | ORAL | Status: DC | PRN
  Administered 2013-11-15: 2 via ORAL
  Administered 2013-11-15: 1 via ORAL
  Administered 2013-11-16 (×2): 2 via ORAL
  Administered 2013-11-16: 1 via ORAL
  Administered 2013-11-17 – 2013-11-20 (×7): 2 via ORAL
  Administered 2013-11-20: 1 via ORAL
  Administered 2013-11-20 – 2013-11-21 (×3): 2 via ORAL
  Filled 2013-11-15 (×11): qty 2
  Filled 2013-11-15 (×3): qty 1
  Filled 2013-11-15 (×3): qty 2

## 2013-11-15 MED ORDER — DEXTROSE 5 % IV SOLN: 1.0000 g | INTRAVENOUS | Status: DC

## 2013-11-15 MED ORDER — METHYLPREDNISOLONE SODIUM SUCC 125 MG IJ SOLR
125.0000 mg | Freq: Once | INTRAMUSCULAR | Status: AC
  Administered 2013-11-15: 125 mg via INTRAVENOUS
  Filled 2013-11-15: qty 2

## 2013-11-15 MED ORDER — SODIUM CHLORIDE 0.9 % IV SOLN
Freq: Once | INTRAVENOUS | Status: AC
  Administered 2013-11-15: 08:00:00 via INTRAVENOUS

## 2013-11-15 MED ORDER — FENTANYL 75 MCG/HR TD PT72
75.0000 ug | MEDICATED_PATCH | TRANSDERMAL | Status: DC
  Administered 2013-11-16 – 2013-11-20 (×3): 75 ug via TRANSDERMAL
  Filled 2013-11-15 (×3): qty 1

## 2013-11-15 MED ORDER — ALBUTEROL SULFATE (2.5 MG/3ML) 0.083% IN NEBU
5.0000 mg | INHALATION_SOLUTION | Freq: Once | RESPIRATORY_TRACT | Status: AC
  Administered 2013-11-15: 5 mg via RESPIRATORY_TRACT
  Filled 2013-11-15: qty 6

## 2013-11-15 MED ORDER — FAMOTIDINE 20 MG PO TABS
20.0000 mg | ORAL_TABLET | Freq: Every day | ORAL | Status: DC
  Administered 2013-11-15 – 2013-11-21 (×7): 20 mg via ORAL
  Filled 2013-11-15 (×7): qty 1

## 2013-11-15 MED ORDER — METHYLPREDNISOLONE SODIUM SUCC 125 MG IJ SOLR
80.0000 mg | Freq: Two times a day (BID) | INTRAMUSCULAR | Status: DC
  Administered 2013-11-15: 80 mg via INTRAVENOUS
  Filled 2013-11-15 (×4): qty 1.28

## 2013-11-15 MED ORDER — POLYETHYLENE GLYCOL 3350 17 G PO PACK
17.0000 g | PACK | Freq: Every day | ORAL | Status: DC | PRN
Start: 2013-11-15 — End: 2013-11-21
  Filled 2013-11-15: qty 1

## 2013-11-15 MED ORDER — LEVOFLOXACIN IN D5W 750 MG/150ML IV SOLN
750.0000 mg | INTRAVENOUS | Status: DC
  Administered 2013-11-15 – 2013-11-19 (×3): 750 mg via INTRAVENOUS
  Filled 2013-11-15 (×4): qty 150

## 2013-11-15 MED ORDER — ALBUTEROL SULFATE (2.5 MG/3ML) 0.083% IN NEBU
2.5000 mg | INHALATION_SOLUTION | Freq: Four times a day (QID) | RESPIRATORY_TRACT | Status: DC
  Administered 2013-11-15: 2.5 mg via RESPIRATORY_TRACT
  Filled 2013-11-15: qty 3

## 2013-11-15 MED ORDER — ALPRAZOLAM 0.25 MG PO TABS
0.2500 mg | ORAL_TABLET | Freq: Three times a day (TID) | ORAL | Status: DC | PRN
  Administered 2013-11-15 – 2013-11-20 (×5): 0.25 mg via ORAL
  Filled 2013-11-15 (×5): qty 1

## 2013-11-15 MED ORDER — TIOTROPIUM BROMIDE MONOHYDRATE 18 MCG IN CAPS
18.0000 ug | ORAL_CAPSULE | Freq: Every day | RESPIRATORY_TRACT | Status: DC | PRN
  Filled 2013-11-15: qty 5

## 2013-11-15 MED ORDER — ONDANSETRON HCL 4 MG/2ML IJ SOLN: 4.0000 mg | Freq: Four times a day (QID) | INTRAMUSCULAR | Status: DC | PRN

## 2013-11-15 MED ORDER — ATORVASTATIN CALCIUM 40 MG PO TABS
40.0000 mg | ORAL_TABLET | Freq: Every evening | ORAL | Status: DC
  Administered 2013-11-15 – 2013-11-20 (×6): 40 mg via ORAL
  Filled 2013-11-15 (×7): qty 1

## 2013-11-15 MED ORDER — TRAMADOL HCL 50 MG PO TABS
100.0000 mg | ORAL_TABLET | Freq: Four times a day (QID) | ORAL | Status: DC
  Administered 2013-11-15 – 2013-11-21 (×21): 100 mg via ORAL
  Filled 2013-11-15 (×21): qty 2

## 2013-11-15 MED ORDER — ALBUTEROL SULFATE (2.5 MG/3ML) 0.083% IN NEBU
2.5000 mg | INHALATION_SOLUTION | RESPIRATORY_TRACT | Status: DC
  Administered 2013-11-15: 2.5 mg via RESPIRATORY_TRACT

## 2013-11-15 MED ORDER — ALBUTEROL SULFATE (2.5 MG/3ML) 0.083% IN NEBU
INHALATION_SOLUTION | RESPIRATORY_TRACT | Status: AC
  Filled 2013-11-15: qty 3

## 2013-11-15 MED ORDER — GUAIFENESIN ER 600 MG PO TB12
600.0000 mg | ORAL_TABLET | Freq: Two times a day (BID) | ORAL | Status: DC
  Administered 2013-11-15 – 2013-11-21 (×13): 600 mg via ORAL
  Filled 2013-11-15 (×14): qty 1

## 2013-11-15 MED ORDER — ALBUTEROL SULFATE (2.5 MG/3ML) 0.083% IN NEBU: 2.5000 mg | INHALATION_SOLUTION | RESPIRATORY_TRACT | Status: DC | PRN

## 2013-11-15 MED ORDER — MOMETASONE FURO-FORMOTEROL FUM 200-5 MCG/ACT IN AERO
2.0000 | INHALATION_SPRAY | Freq: Two times a day (BID) | RESPIRATORY_TRACT | Status: DC
  Administered 2013-11-15 – 2013-11-21 (×13): 2 via RESPIRATORY_TRACT
  Filled 2013-11-15: qty 8.8

## 2013-11-15 MED ORDER — DEXTROSE 5 % IV SOLN: 500.0000 mg | INTRAVENOUS | Status: DC

## 2013-11-15 MED ORDER — POLYETHYLENE GLYCOL 3350 17 G PO PACK: 17.0000 g | PACK | Freq: Every day | ORAL | Status: DC | PRN

## 2013-11-15 MED ORDER — NADOLOL 20 MG PO TABS
20.0000 mg | ORAL_TABLET | Freq: Every evening | ORAL | Status: DC
  Administered 2013-11-15 – 2013-11-20 (×6): 20 mg via ORAL
  Filled 2013-11-15 (×7): qty 1

## 2013-11-15 MED ORDER — RIVAROXABAN 20 MG PO TABS
20.0000 mg | ORAL_TABLET | Freq: Every day | ORAL | Status: DC
  Administered 2013-11-15 – 2013-11-20 (×6): 20 mg via ORAL
  Filled 2013-11-15 (×7): qty 1

## 2013-11-15 MED ORDER — ONDANSETRON HCL 4 MG PO TABS
4.0000 mg | ORAL_TABLET | Freq: Four times a day (QID) | ORAL | Status: DC | PRN
  Administered 2013-11-15 (×2): 4 mg via ORAL
  Filled 2013-11-15 (×2): qty 1

## 2013-11-15 MED ORDER — GABAPENTIN 300 MG PO CAPS
300.0000 mg | ORAL_CAPSULE | Freq: Two times a day (BID) | ORAL | Status: DC
  Administered 2013-11-15 – 2013-11-21 (×13): 300 mg via ORAL
  Filled 2013-11-15 (×14): qty 1

## 2013-11-15 NOTE — ED Notes (Signed)
Bed: PJ09 Expected date:  Expected time:  Means of arrival:  Comments: Hold- Rm #21

## 2013-11-15 NOTE — ED Notes (Signed)
Bed: WA21 Expected date:  Expected time:  Means of arrival:  Comments: 

## 2013-11-15 NOTE — Care Management Note (Addendum)
    Page 1 of 1   11/20/2013     4:29:19 PM   CARE MANAGEMENT NOTE 11/20/2013  Patient:  Katherine Mckay, Katherine Mckay   Account Number:  [id number]  Date Initiated:  11/15/2013  Documentation initiated by:  Dessa Phi  Subjective/Objective Assessment:   73 Y/O F ADMITTED W/SOB.COPD.     Action/Plan:   FROM HOME W/SPOUSE.HAS CANE,RW,W/C.HAS PCP,PHARMACY.   Anticipated DC Date:  11/20/2013   Anticipated DC Plan:  [place]  CM consult      Choice offered to / List presented to:  C-1 Patient           Status of service:  In process, will continue to follow Medicare Important Message given?   (If response is "NO", the following Medicare IM given date fields will be blank) Date Medicare IM given:   Date Additional Medicare IM given:    Discharge Disposition:    Per UR Regulation:  Reviewed for med. necessity/level of care/duration of stay  If discussed at Long Length of Stay Meetings, dates discussed:    Comments:  11/20/13 Georgeana Oertel RN,BSN NCM [place].HAS USED [facility] IN THE PAST AGREE TO USE THEM AGAIN IF NEEDED.TC KRISTEN AWARE OF REFERRAL FOR HHPT.AWAIT FINAL HHPT ORDER.CAN ARRANGE HOME 02 IF QUALIFIES.  11/18/13 Zac Torti RN,BSN NCM [place] [facility],COPD,WHEEZING,02,IV [place]. IF HOME 02,OR [place] NEEDED CAN ARRANGE.  11/15/13 Aldine Grainger RN,BSN NCM [phone]

## 2013-11-15 NOTE — ED Notes (Signed)
Hospitalist at bedside 

## 2013-11-15 NOTE — ED Notes (Signed)
Pt states that she has hx of COPD.  Pt states that she has been having severe SOB since yesterday.  C/o headache.  Denies cough.  States that others around her have been sick with cough and cold.  Is also concerned about her stool because it has been very black and tarry and she has been on xarelto.

## 2013-11-15 NOTE — ED Notes (Signed)
Patient transported to CT 

## 2013-11-15 NOTE — ED Provider Notes (Signed)
CSN: [id number]     Arrival date & time 11/15/13  0726 History   First MD Initiated Contact with Patient 11/15/13 [phone]     Chief Complaint  Patient presents with  . Shortness of Breath     (Consider location/radiation/quality/duration/timing/severity/associated sxs/prior Treatment) HPI Comments: Patient with history of COPD presenting with difficulty breathing and shortness of breath since last night. She states she has been coughing but not getting anything up. Denies any chest pain. Her husband has been sick recently. She denies any fevers. She used her dulera without relief. She denied using the albuterol. She denies any leg pain or leg swelling. She denies abdominal pain, nausea or vomiting. She endorses gradual onset headache that happened this morning. Similar to previous. Denies any falls or trauma. No focal weakness, numbness or tingling. She also endorses dark stools for the past several months since she has been on xarelto. She states she is always dizzy and lightheaded at baseline. She is hypoxic in triage 84%. She used to be on oxygen at home but is no longer.  The history is provided by the patient and the spouse. The history is limited by the condition of the patient.    Past Medical History  Diagnosis Date  . Polio   . Osteoporosis     multiple compression fractures  . Crohn's disease     since 1966 on chronic steroids s/p ileal resection, fistula repair  . Brain tumor   . Migraines   . Hypercholesteremia   . Hypertension   . GERD (gastroesophageal reflux disease)   . Anxiety   . Depression   . Emphysema   . Macular degeneration   . Cataract   . Arthritis   . Pernicious anemia   . Primary adrenal deficiency   . COPD (chronic obstructive pulmonary disease)   . Neuropathy, idiopathic   . Anticoagulated on warfarin     Hx of DVT, PE  . Deafness in right ear    Past Surgical History  Procedure Laterality Date  . Cholecystectomy    . Cataract repair    . Ileal  resection    . Hemicolectomy      rt due to crohns  . Colonoscopy  08/17/2011    Procedure: COLONOSCOPY;  Surgeon: Landry Dyke, MD;  Location: [facility];  Service: Endoscopy;  Laterality: N/A;  . Givens capsule study  09/27/2011    Procedure: GIVENS CAPSULE STUDY;  Surgeon: Landry Dyke, MD;  Location: [facility];  Service: Endoscopy;  Laterality: N/A;   Family History  Problem Relation Age of Onset  . Anesthesia problems Neg Hx   . Hypotension Neg Hx   . Malignant hyperthermia Neg Hx   . Pseudochol deficiency Neg Hx    History  Substance Use Topics  . Smoking status: Former Smoker -- 1.00 packs/day for 55 years    Types: Cigarettes    Quit date: 10/28/2011  . Smokeless tobacco: Never Used  . Alcohol Use: No   OB History   Grav Para Term Preterm Abortions TAB SAB Ect Mult Living                 Review of Systems  Constitutional: Positive for activity change, appetite change and fatigue. Negative for fever.  HENT: Negative for congestion and rhinorrhea.   Respiratory: Positive for cough and shortness of breath. Negative for chest tightness.   Cardiovascular: Negative for chest pain.  Gastrointestinal: Positive for blood in stool. Negative for nausea, vomiting and abdominal  pain.  Genitourinary: Negative for dysuria, hematuria, vaginal bleeding and vaginal discharge.  Musculoskeletal: Positive for arthralgias and myalgias. Negative for back pain.  Skin: Negative for rash.  Neurological: Positive for dizziness, weakness, light-headedness and headaches.  A complete 10 system review of systems was obtained and all systems are negative except as noted in the HPI and PMH.      Allergies  Celebrex; Penicillins; Smz-tmp ds; Ceclor; Ciprofloxacin hcl; Diclofenac sodium; Doxycycline; Flagyl; Hydrocodone; Iohexol; Other; Percocet; Prilosec; and Tylenol  Home Medications   No current outpatient prescriptions on file. BP 135/58  Pulse 81  Temp(Src) 97.3 F (36.3 C)  (Oral)  Resp 18  Ht 5\' 3"  (1.6 m)  Wt 121 lb 11.2 oz (55.203 kg)  BMI 21.56 kg/m2  SpO2 95% Physical Exam  Constitutional: She is oriented to person, place, and time. She appears well-developed and well-nourished. She appears distressed.  Mild tachypnea and respiratory distress, speaking in short sentences  HENT:  Head: Normocephalic and atraumatic.  Mouth/Throat: Oropharynx is clear and moist. No oropharyngeal exudate.  Eyes: Conjunctivae and EOM are normal. Pupils are equal, round, and reactive to light.  Neck: Normal range of motion. Neck supple.  Cardiovascular: Normal rate, regular rhythm and normal heart sounds.   Pulmonary/Chest: Breath sounds normal. She is in respiratory distress.  Decreased breath sounds throughout, no appreciable wheezing  Abdominal: Soft. There is no tenderness. There is no rebound and no guarding.  Genitourinary:  Melanotic stool  Musculoskeletal: Normal range of motion. She exhibits no edema and no tenderness.  Neurological: She is alert and oriented to person, place, and time. No cranial nerve deficit. She exhibits normal muscle tone. Coordination normal.  Skin: Skin is warm.    ED Course  Procedures (including critical care time) Labs Review Labs Reviewed  BASIC METABOLIC PANEL - Abnormal; Notable for the following:    Sodium 135 (*)    Potassium 3.5 (*)    Chloride 92 (*)    CO2 33 (*)    Glucose, Bld 103 (*)    GFR calc non Af Amer 52 (*)    GFR calc Af Amer 60 (*)    All other components within normal limits  CBC - Abnormal; Notable for the following:    WBC 10.8 (*)    Hemoglobin 15.7 (*)    HCT 48.6 (*)    MCV 101.9 (*)    All other components within normal limits  PROTIME-INR - Abnormal; Notable for the following:    Prothrombin Time 19.5 (*)    INR 1.70 (*)    All other components within normal limits  BLOOD GAS, ARTERIAL - Abnormal; Notable for the following:    pCO2 arterial 53.7 (*)    pO2, Arterial 74.5 (*)    Bicarbonate  31.2 (*)    Acid-Base Excess 4.9 (*)    All other components within normal limits  PRO B NATRIURETIC PEPTIDE - Abnormal; Notable for the following:    Pro B Natriuretic peptide (BNP) 1718.0 (*)    All other components within normal limits  POC OCCULT BLOOD, ED - Abnormal; Notable for the following:    Fecal Occult Bld POSITIVE (*)    All other components within normal limits  I-STAT TROPOININ, ED  I-STAT CG4 LACTIC ACID, ED  TYPE AND SCREEN   Imaging Review Ct Head Wo Contrast  11/15/2013   CLINICAL DATA:  Headaches  EXAM: CT HEAD WITHOUT CONTRAST  TECHNIQUE: Contiguous axial images were obtained from the base of the  skull through the vertex without intravenous contrast.  COMPARISON:  12/27/2012  FINDINGS: Bony calvarium is intact. Mild atrophic changes are again identified. Scattered areas of decreased attenuation identified consistent with chronic white matter ischemic change. Basal ganglia lacunar infarcts are again identified bilaterally. No findings to suggest acute hemorrhage, acute infarction or space-occupying mass lesion are noted.  IMPRESSION: Chronic changes without acute abnormality.   Electronically Signed   By: Inez Catalina M.D.   On: 11/15/2013 08:43   Dg Chest Port 1 View  11/15/2013   CLINICAL DATA:  Shortness of breath  EXAM: PORTABLE CHEST - 1 VIEW  COMPARISON:  12/31/2012  FINDINGS: Cardiac shadow is within normal limits. Diffuse interstitial changes are noted throughout both lungs. No focal infiltrate is seen. No acute bony abnormality is noted.  IMPRESSION: Chronic changes without acute abnormality.   Electronically Signed   By: Inez Catalina M.D.   On: 11/15/2013 08:24     EKG Interpretation   Date/Time:  Sunday November 15 2013 07:41:22 EDT Ventricular Rate:  78 PR Interval:  140 QRS Duration: 92 QT Interval:  362 QTC Calculation: 412 R Axis:   62 Text Interpretation:  Sinus rhythm No significant change was found  Confirmed by Wyvonnia Dusky  MD, Jaydrien Wassenaar ([id number]) on 11/15/2013  8:48:06 AM      MDM   Final diagnoses:  Respiratory failure with hypoxia  COPD (chronic obstructive pulmonary disease)   Increased work of breathing and shortness of breath since last night with dark tarry stools. Hypoxic on arrival with respiratory distress. Placed on oxygen with increase in O2 saturation to 92. Lungs have decreased breath sounds throughout. Patient given nebulizers and steroids. We'll obtain chest x-ray and EKG.  CXR without acute change. EKG unchanged. Nebs and steroids have improved work of breathing and tachypnea. Doubt PE as patient on xarelto.  Will treat for COPD exacerbation.  [place], on xarelto but HD stable, hemoglobin stable.  Patient with ongoing hypoxia and increased work of breathing. Dr. Reynaldo Minium to admit.  BP 135/58  Pulse 81  Temp(Src) 97.3 F (36.3 C) (Oral)  Resp 18  Ht 5\' 3"  (1.6 m)  Wt 121 lb 11.2 oz (55.203 kg)  BMI 21.56 kg/m2  SpO2 95%    Ezequiel Essex, MD 11/15/13 1724

## 2013-11-15 NOTE — H&P (Signed)
PCP:   [name], MD   Chief Complaint:  Congestion and shortness of breath  HPI: Katherine Mckay is a 73 year old patient of Dr. Forde Dandy with a complex medical history mainly including COPD, hyperlipidemia, essential hypertension, osteoporosis with multiple compression fractures and pain, and Crohn disease presenting at this time with increasing shortness of breath, cough and congestion. She does live with her husband who is recently been ill and relates that she is caught "his bug". She has had increasing chest congestion and cough with very little productive sputum. Accompanying that she's had increasing air hunger and presents in the early morning hours unable to catch her breath. It should be noted in the past she has been oxygen dependent but of recent weeks has been off of her oxygen with stable saturations. Upon presenting to the emergency room her oxygen saturation was in the low 80% range and should markedly dyspneic and air hunger. She has settled down to a large extent with supplemental option, continues bronchodilators and IV steroids but remains hypoxic and remained with continued subjective shortness of breath. She has had some cough congestion and rattling but no sputum production. [name] had no fever, no blood, no nausea or vomiting and no chest pain. Mentally she is been clear with no motor changes. [name] had no abdominal pain nausea vomiting bowels have been normal.   Past Medical History: Past Medical History  Diagnosis Date  . Polio   . Osteoporosis     multiple compression fractures  . Crohn's disease     since 1966 on chronic steroids s/p ileal resection, fistula repair  . Brain tumor   . Migraines   . Hypercholesteremia   . Hypertension   . GERD (gastroesophageal reflux disease)   . Anxiety   . Depression   . Emphysema   . Macular degeneration   . Cataract   . Arthritis   . Pernicious anemia   . Primary adrenal deficiency   . COPD (chronic obstructive pulmonary  disease)   . Neuropathy, idiopathic   . Anticoagulated on warfarin     Hx of DVT, PE  . Deafness in right ear    Past Surgical History  Procedure Laterality Date  . Cholecystectomy    . Cataract repair    . Ileal resection    . Hemicolectomy      rt due to crohns  . Colonoscopy  08/17/2011    Procedure: COLONOSCOPY;  Surgeon: Landry Dyke, MD;  Location: [facility];  Service: Endoscopy;  Laterality: N/A;  . Givens capsule study  09/27/2011    Procedure: GIVENS CAPSULE STUDY;  Surgeon: Landry Dyke, MD;  Location: [facility];  Service: Endoscopy;  Laterality: N/A;    Medications: Prior to Admission medications   Medication Sig Start Date End Date Taking? Authorizing Provider  ALPRAZolam (XANAX) 0.25 MG tablet Take 0.25 mg by mouth 3 (three) times daily as needed for anxiety.    Yes Historical Provider, MD  amLODipine (NORVASC) 2.5 MG tablet Take 2.5 mg by mouth at bedtime.    Yes Historical Provider, MD  atorvastatin (LIPITOR) 40 MG tablet Take 40 mg by mouth every evening.  05/12/12  Yes Historical Provider, MD  calcium carbonate (OS-CAL) 600 MG TABS Take 600 mg by mouth every evening.    Yes Historical Provider, MD  Cholecalciferol (VITAMIN D3) 50000 UNITS CAPS Take 100,000 Units by mouth every Sunday.   Yes Historical Provider, MD  DULoxetine (CYMBALTA) 60 MG capsule Take 60 mg by mouth every  morning.    Yes Historical Provider, MD  fentaNYL (DURAGESIC - DOSED MCG/HR) 75 MCG/HR Place 75 mcg onto the skin every other day.    Yes Historical Provider, MD  ferrous sulfate 325 (65 FE) MG tablet Take 325 mg by mouth 3 (three) times daily with meals.   Yes Historical Provider, MD  gabapentin (NEURONTIN) 300 MG capsule Take 1 capsule (300 mg total) by mouth 2 (two) times daily. 01/02/13  Yes [name], MD  mometasone-formoterol [facility]) 200-5 MCG/ACT AERO Inhale 2 puffs into the lungs 2 (two) times daily.   Yes Historical Provider, MD  nadolol (CORGARD) 20 MG tablet Take 20  mg by mouth every evening.    Yes Historical Provider, MD  polyethylene glycol (MIRALAX / GLYCOLAX) packet Take 17 g by mouth daily as needed for mild constipation.   Yes Historical Provider, MD  potassium chloride (KLOR-CON) 10 MEQ CR tablet Take 10 mEq by mouth every morning.    Yes Historical Provider, MD  predniSONE (DELTASONE) 10 MG tablet Take 1 tablet (10 mg total) by mouth every morning. 01/02/13  Yes [name], MD  Probiotic Product (ALIGN) 4 MG CAPS Take 1 capsule by mouth every morning.    Yes Historical Provider, MD  promethazine (PHENERGAN) 25 MG tablet Take 25 mg by mouth every 6 (six) hours as needed for nausea. Nausea   Yes Historical Provider, MD  ranitidine (ZANTAC) 300 MG capsule Take 300 mg by mouth every evening.    Yes Historical Provider, MD  Rivaroxaban 20 MG TABS Take 20 mg by mouth every evening.    Yes Historical Provider, MD  Teriparatide, Recombinant, (FORTEO [place]) Inject into the skin every morning.   Yes Historical Provider, MD  tiotropium (SPIRIVA) 18 MCG inhalation capsule Place 18 mcg into inhaler and inhale daily as needed (For shortness of breath.).    Yes Historical Provider, MD  tolterodine (DETROL) 2 MG tablet Take 2 mg by mouth every morning.    Yes Historical Provider, MD  traMADol (ULTRAM) 50 MG tablet Take 100 mg by mouth every 8 (eight) hours as needed for pain.    Yes Historical Provider, MD    Allergies:   Allergies  Allergen Reactions  . Celebrex [Celecoxib] Shortness Of Breath    No voiding  . Penicillins Shortness Of Breath and Rash  . Smz-Tmp Ds [Sulfamethoxazole W/Trimethoprim (Co-Trimoxazole)] Rash  . Ceclor [Cefaclor] Other (See Comments)    Stomach pain  . Ciprofloxacin Hcl     Possible GI Bleed.  . Diclofenac Sodium     Pt doesn't remember reaction  . Doxycycline     Stomach pains.  . Flagyl [Metronidazole Hcl]     Vaginal rash  . Hydrocodone Other (See Comments)    Severe stomach pain  . Iohexol      Code: HIVES, Desc:  pt states she breaks out in hives and sneezes 10/01/08   Desc: STATES SHE ONLY REACTS BY SNEEZING   . Other Other (See Comments)    Darvocet - liver damage  . Percocet [Oxycodone-Acetaminophen] Other (See Comments)    Liver damage  . Prilosec [Omeprazole] Other (See Comments)    Causes ulcers in stomach and esophagus  . Tylenol [Acetaminophen] Other (See Comments)    Liver damage    Social History:  reports that she quit smoking about 2 years ago. Her smoking use included Cigarettes. She has a 55 pack-year smoking history. She has never used smokeless tobacco. She reports that she does not  drink alcohol or use illicit drugs.  Family History: Family History  Problem Relation Age of Onset  . Anesthesia problems Neg Hx   . Hypotension Neg Hx   . Malignant hyperthermia Neg Hx   . Pseudochol deficiency Neg Hx     Physical Exam: Filed Vitals:   11/15/13 0900 11/15/13 0930 11/15/13 1000 11/15/13 1015  BP:    145/76  Pulse: 75 76 77 79  Temp:      TempSrc:      Resp: 25 22 23 22   SpO2: 98% 98% 99% 98%   General appearance: alert, cooperative and mild distress, mild accessory muscle utilization, frail Head: Normocephalic, without obvious abnormality, atraumatic Eyes: conjunctivae/corneas clear. PERRL, EOM's intact.  Nose: Nares normal. Septum midline. Mucosa normal. No drainage or sinus tenderness. Throat: lips, mucosa, and tongue normal; teeth and gums normal Neck: no adenopathy, no carotid bruit, no JVD and thyroid not enlarged, symmetric, no tenderness/mass/nodules Resp:  prolonged expiratory phase few scattered rhonchi and diminished breath sounds throughout Cardio: regular rate and rhythm GI: soft, non-tender; bowel sounds normal; no masses,  no organomegaly Extremities: extremities normal, atraumatic, no cyanosis or edema Pulses: 2+ and symmetric Lymph nodes: Cervical adenopathy: no cervical lymphadenopathy Neurologic: Alert and oriented X 3, normal strength and tone.  Normal symmetric reflexes.     Labs on Admission:   Recent Labs  11/15/13 0800  NA 135*  K 3.5*  CL 92*  CO2 33*  GLUCOSE 103*  BUN 14  CREATININE 1.06  CALCIUM 10.1   No results found for this basename: AST, ALT, ALKPHOS, BILITOT, PROT, ALBUMIN,  in the last 72 hours No results found for this basename: LIPASE, AMYLASE,  in the last 72 hours  Recent Labs  11/15/13 0800  WBC 10.8*  HGB 15.7*  HCT 48.6*  MCV 101.9*  PLT 167   No results found for this basename: CKTOTAL, CKMB, CKMBINDEX, TROPONINI,  in the last 72 hours No results found for this basename: TSH, T4TOTAL, FREET3, T3FREE, THYROIDAB,  in the last 72 hours No results found for this basename: VITAMINB12, FOLATE, FERRITIN, TIBC, IRON, RETICCTPCT,  in the last 72 hours  Radiological Exams on Admission: Ct Head Wo Contrast  11/15/2013   CLINICAL DATA:  Headaches  EXAM: CT HEAD WITHOUT CONTRAST  TECHNIQUE: Contiguous axial images were obtained from the base of the skull through the vertex without intravenous contrast.  COMPARISON:  12/27/2012  FINDINGS: Bony calvarium is intact. Mild atrophic changes are again identified. Scattered areas of decreased attenuation identified consistent with chronic white matter ischemic change. Basal ganglia lacunar infarcts are again identified bilaterally. No findings to suggest acute hemorrhage, acute infarction or space-occupying mass lesion are noted.  IMPRESSION: Chronic changes without acute abnormality.   Electronically Signed   By: Inez Catalina M.D.   On: 11/15/2013 08:43   Dg Chest Port 1 View  11/15/2013   CLINICAL DATA:  Shortness of breath  EXAM: PORTABLE CHEST - 1 VIEW  COMPARISON:  12/31/2012  FINDINGS: Cardiac shadow is within normal limits. Diffuse interstitial changes are noted throughout both lungs. No focal infiltrate is seen. No acute bony abnormality is noted.  IMPRESSION: Chronic changes without acute abnormality.   Electronically Signed   By: Inez Catalina M.D.   On:  11/15/2013 08:24   Orders placed during the hospital encounter of 11/15/13  . ED EKG  . ED EKG  . EKG 12-LEAD  . EKG 12-LEAD    Assessment/Plan Active Problems:  #1 acute respiratory  failure secondary to exacerbation of COPD  #2 acute bronchitis secondary URI no obvious evidence of pneumonia at this time  #3 osteoporosis with chronic pain and compression fractures on Forteo and narcotics  #4 chronic constipation  #5 essential hypertension  #6 gastroesophageal reflux disease  #7 protein calorie malnutrition  Admit for continued supplemental oxygen, IV steroids, empiric antibiotics   Arsal Tappan A 11/15/2013, 10:29 AM

## 2013-11-15 NOTE — ED Notes (Signed)
Pt's husband would like to give Pt her home dose of Forteo.  This RN will page the Hospitalist.

## 2013-11-16 MED ORDER — PREDNISONE 20 MG PO TABS
30.0000 mg | ORAL_TABLET | Freq: Every day | ORAL | Status: DC
  Administered 2013-11-16 – 2013-11-17 (×2): 30 mg via ORAL
  Filled 2013-11-16 (×3): qty 1

## 2013-11-16 NOTE — Progress Notes (Signed)
Subjective: Had a pretty good night. No chills or sweats. No fever noted. Breathing is a bit easier. Less coughing. No diarrhea since arriving. Slept about 4 hrs  Objective: Vital signs in last 24 hours: Temp:  [97.3 F (36.3 C)-97.6 F (36.4 C)] 97.6 F (36.4 C) (04/06 0447) Pulse Rate:  [57-81] 57 (04/06 0447) Resp:  [18-25] 18 (04/06 0447) BP: (94-156)/(50-83) 94/50 mmHg (04/06 0447) SpO2:  [95 %-100 %] 96 % (04/06 0447) Weight:  [55.203 kg (121 lb 11.2 oz)] 55.203 kg (121 lb 11.2 oz) (04/05 1142)  Intake/Output from previous day: 04/05 0701 - 04/06 0700 In: 23 [P.O.:240; I.V.:30; IV Piggyback:150] Out: 350 [Urine:350] Intake/Output this shift:    Chronically ill but in no distress. Oral membranes moist. Lungs diminished throughout with some left sided rhonchi, no consolidation. Kyphosis is pronounced. Ht regular with murmur. abd soft NT. No edema. Awake, mentating OK  Lab Results   Recent Labs  11/15/13 0800  WBC 10.8*  RBC 4.77  HGB 15.7*  HCT 48.6*  MCV 101.9*  MCH 32.9  RDW 14.2  PLT 167    Recent Labs  11/15/13 0800  NA 135*  K 3.5*  CL 92*  CO2 33*  GLUCOSE 103*  BUN 14  CREATININE 1.06  CALCIUM 10.1    Studies/Results: Ct Head Wo Contrast  11/15/2013   CLINICAL DATA:  Headaches  EXAM: CT HEAD WITHOUT CONTRAST  TECHNIQUE: Contiguous axial images were obtained from the base of the skull through the vertex without intravenous contrast.  COMPARISON:  12/27/2012  FINDINGS: Bony calvarium is intact. Mild atrophic changes are again identified. Scattered areas of decreased attenuation identified consistent with chronic white matter ischemic change. Basal ganglia lacunar infarcts are again identified bilaterally. No findings to suggest acute hemorrhage, acute infarction or space-occupying mass lesion are noted.  IMPRESSION: Chronic changes without acute abnormality.   Electronically Signed   By: Inez Catalina M.D.   On: 11/15/2013 08:43   Dg Chest Port 1  View  11/15/2013   CLINICAL DATA:  Shortness of breath  EXAM: PORTABLE CHEST - 1 VIEW  COMPARISON:  12/31/2012  FINDINGS: Cardiac shadow is within normal limits. Diffuse interstitial changes are noted throughout both lungs. No focal infiltrate is seen. No acute bony abnormality is noted.  IMPRESSION: Chronic changes without acute abnormality.   Electronically Signed   By: Inez Catalina M.D.   On: 11/15/2013 08:24    Scheduled Meds: . albuterol  2.5 mg Nebulization Q6H WA  . ALIGN  1 capsule Oral q morning - 10a  . amLODipine  2.5 mg Oral QHS  . atorvastatin  40 mg Oral QPM  . DULoxetine  60 mg Oral q morning - 10a  . famotidine  20 mg Oral Daily  . fentaNYL  75 mcg Transdermal QODAY  . gabapentin  300 mg Oral BID  . guaiFENesin  600 mg Oral BID  . levofloxacin (LEVAQUIN) IV  750 mg Intravenous Q48H  . methylPREDNISolone (SOLU-MEDROL) injection  80 mg Intravenous Q12H  . mometasone-formoterol  2 puff Inhalation BID  . nadolol  20 mg Oral QPM  . potassium chloride  10 mEq Oral q morning - 10a  . Rivaroxaban  20 mg Oral Q supper  . traMADol  100 mg Oral 4 times per day   Continuous Infusions:  PRN Meds:albuterol, ALPRAZolam, ondansetron (ZOFRAN) IV, ondansetron, oxyCODONE-acetaminophen, polyethylene glycol, tiotropium  Assessment/Plan:  #1 acute respiratory failure secondary to exacerbation of COPD : doing a bit better. Will mobilize  today. Change to PO steroids. Continue O2. Will need home O2 #2 acute bronchitis secondary URI no obvious evidence of pneumonia at this time . XR negative for infiltrates #3 osteoporosis with chronic pain and compression fractures on Forteo and narcotics  #4 chronic constipation: doing OK  #5 essential hypertension : BP OK overall #6 gastroesophageal reflux disease : on Rx #7 protein calorie malnutrition: a chronic issue    LOS: 1 day   Darden Flemister [name] 11/16/2013, 7:49 AM

## 2013-11-17 ENCOUNTER — Inpatient Hospital Stay (HOSPITAL_COMMUNITY): Payer: Medicare Other

## 2013-11-17 DIAGNOSIS — J449 Chronic obstructive pulmonary disease, unspecified: Secondary | ICD-10-CM

## 2013-11-17 DIAGNOSIS — J438 Other emphysema: Secondary | ICD-10-CM

## 2013-11-17 DIAGNOSIS — E2749 Other adrenocortical insufficiency: Secondary | ICD-10-CM

## 2013-11-17 DIAGNOSIS — J9600 Acute respiratory failure, unspecified whether with hypoxia or hypercapnia: Secondary | ICD-10-CM

## 2013-11-17 DIAGNOSIS — J479 Bronchiectasis, uncomplicated: Secondary | ICD-10-CM

## 2013-11-17 LAB — COMPREHENSIVE METABOLIC PANEL
ALBUMIN: 2.7 g/dL — AB (ref 3.5–5.2)
ALK PHOS: 90 U/L (ref 39–117)
ALT: 11 U/L (ref 0–35)
AST: 19 U/L (ref 0–37)
BILIRUBIN TOTAL: 0.3 mg/dL (ref 0.3–1.2)
BUN: 43 mg/dL — ABNORMAL HIGH (ref 6–23)
CHLORIDE: 96 meq/L (ref 96–112)
CO2: 30 mEq/L (ref 19–32)
Calcium: 9 mg/dL (ref 8.4–10.5)
Creatinine, Ser: 1.68 mg/dL — ABNORMAL HIGH (ref 0.50–1.10)
GFR calc Af Amer: 34 mL/min — ABNORMAL LOW (ref >90)
GFR calc non Af Amer: 30 mL/min — ABNORMAL LOW (ref >90)
Glucose, Bld: 102 mg/dL — ABNORMAL HIGH (ref 70–99)
POTASSIUM: 5.1 meq/L (ref 3.7–5.3)
SODIUM: 137 meq/L (ref 137–147)
Total Protein: 6.2 g/dL (ref 6.0–8.3)

## 2013-11-17 LAB — CBC
HCT: 43.7 % (ref 36.0–46.0)
Hemoglobin: 14.2 g/dL (ref 12.0–15.0)
MCH: 32.9 pg (ref 26.0–34.0)
MCHC: 32.5 g/dL (ref 30.0–36.0)
MCV: 101.2 fL — AB (ref 78.0–100.0)
PLATELETS: 177 10*3/uL (ref 150–400)
RBC: 4.32 MIL/uL (ref 3.87–5.11)
RDW: 14.2 % (ref 11.5–15.5)
WBC: 9 10*3/uL (ref 4.0–10.5)

## 2013-11-17 MED ORDER — METHYLPREDNISOLONE SODIUM SUCC 40 MG IJ SOLR
40.0000 mg | Freq: Two times a day (BID) | INTRAMUSCULAR | Status: DC
  Administered 2013-11-17 – 2013-11-18 (×2): 40 mg via INTRAVENOUS
  Filled 2013-11-17 (×3): qty 1

## 2013-11-17 MED ORDER — SODIUM CHLORIDE 0.9 % IV SOLN
INTRAVENOUS | Status: DC
  Administered 2013-11-17 – 2013-11-19 (×4): via INTRAVENOUS

## 2013-11-17 NOTE — Consult Note (Addendum)
PULMONARY / CRITICAL CARE MEDICINE   Name: Katherine Mckay MRN: [id number] DOB: Oct 31, 1940    ADMISSION DATE:  11/15/2013 CONSULTATION DATE: 4/7  REFERRING MD : [facility] PRIMARY SERVICE: [facility]  CHIEF COMPLAINT: SOB  BRIEF PATIENT DESCRIPTION:  73 yo female with severe lung disease, bronchiectasis, followed by K. Clance, dlco 41% severe obstruction and air trapping. She can walk around the house and has not been out to store in 2 years. 1 week she had foot surgery and noted URI sxs 1 day later.  She was admitted 4/5 with congestion and sob, tx with abx , O2(she has been o2 dependent before) along with  PO steroids.  4/7 early am she had increasing WOB, cough but has improved with 35% vent mask and is in nad currently. PCCM asked to evaluate.  SIGNIFICANT EVENTS / STUDIES:    LINES / TUBES:   CULTURES:   ANTIBIOTICS: 4/6 levaquin>>  HISTORY OF PRESENT ILLNESS:   73 yo female with severe lung disease, bronchiectasis, followed by K. Clance, dlco 41% severe obstruction and air trapping. She can walk around the house and has not been out to store in 2 years. 1 week she had foot surgery and noted URI sxs 1 day later.  She was admitted 4/5 with congestion and sob, tx with abx , O2(she has been o2 dependent before) along with  PO steroids.  4/7 early am she had increasing WOB, cough but has improved with 35% vent mask and is in nad currently. PCCM asked to evaluate.  PAST MEDICAL HISTORY :  Past Medical History  Diagnosis Date  . Polio   . Osteoporosis     multiple compression fractures  . Crohn's disease     since 1966 on chronic steroids s/p ileal resection, fistula repair  . Brain tumor   . Migraines   . Hypercholesteremia   . Hypertension   . GERD (gastroesophageal reflux disease)   . Anxiety   . Depression   . Emphysema   . Macular degeneration   . Cataract   . Arthritis   . Pernicious anemia   . Primary adrenal deficiency   . COPD (chronic obstructive pulmonary disease)    . Neuropathy, idiopathic   . Anticoagulated on warfarin     Hx of DVT, PE  . Deafness in right ear    Past Surgical History  Procedure Laterality Date  . Cholecystectomy    . Cataract repair    . Ileal resection    . Hemicolectomy      rt due to crohns  . Colonoscopy  08/17/2011    Procedure: COLONOSCOPY;  Surgeon: Landry Dyke, MD;  Location: [facility];  Service: Endoscopy;  Laterality: N/A;  . Givens capsule study  09/27/2011    Procedure: GIVENS CAPSULE STUDY;  Surgeon: Landry Dyke, MD;  Location: [facility];  Service: Endoscopy;  Laterality: N/A;   Prior to Admission medications   Medication Sig Start Date End Date Taking? Authorizing Provider  ALPRAZolam (XANAX) 0.25 MG tablet Take 0.25 mg by mouth 3 (three) times daily as needed for anxiety.    Yes Historical Provider, MD  amLODipine (NORVASC) 2.5 MG tablet Take 2.5 mg by mouth at bedtime.    Yes Historical Provider, MD  atorvastatin (LIPITOR) 40 MG tablet Take 40 mg by mouth every evening.  05/12/12  Yes Historical Provider, MD  calcium carbonate (OS-CAL) 600 MG TABS Take 600 mg by mouth every evening.    Yes Historical Provider, MD  Cholecalciferol (  VITAMIN D3) 50000 UNITS CAPS Take 100,000 Units by mouth every Sunday.   Yes Historical Provider, MD  DULoxetine (CYMBALTA) 60 MG capsule Take 60 mg by mouth every morning.    Yes Historical Provider, MD  fentaNYL (DURAGESIC - DOSED MCG/HR) 75 MCG/HR Place 75 mcg onto the skin every other day.    Yes Historical Provider, MD  ferrous sulfate 325 (65 FE) MG tablet Take 325 mg by mouth 3 (three) times daily with meals.   Yes Historical Provider, MD  gabapentin (NEURONTIN) 300 MG capsule Take 1 capsule (300 mg total) by mouth 2 (two) times daily. 01/02/13  Yes [name], MD  mometasone-formoterol [facility]) 200-5 MCG/ACT AERO Inhale 2 puffs into the lungs 2 (two) times daily.   Yes Historical Provider, MD  nadolol (CORGARD) 20 MG tablet Take 20 mg by mouth every evening.     Yes Historical Provider, MD  polyethylene glycol (MIRALAX / GLYCOLAX) packet Take 17 g by mouth daily as needed for mild constipation.   Yes Historical Provider, MD  potassium chloride (KLOR-CON) 10 MEQ CR tablet Take 10 mEq by mouth every morning.    Yes Historical Provider, MD  predniSONE (DELTASONE) 10 MG tablet Take 1 tablet (10 mg total) by mouth every morning. 01/02/13  Yes [name], MD  Probiotic Product (ALIGN) 4 MG CAPS Take 1 capsule by mouth every morning.    Yes Historical Provider, MD  promethazine (PHENERGAN) 25 MG tablet Take 25 mg by mouth every 6 (six) hours as needed for nausea. Nausea   Yes Historical Provider, MD  ranitidine (ZANTAC) 300 MG capsule Take 300 mg by mouth every evening.    Yes Historical Provider, MD  Rivaroxaban 20 MG TABS Take 20 mg by mouth every evening.    Yes Historical Provider, MD  Teriparatide, Recombinant, (FORTEO [place]) Inject 20 mcg into the skin every morning.    Yes Historical Provider, MD  tiotropium (SPIRIVA) 18 MCG inhalation capsule Place 18 mcg into inhaler and inhale daily as needed (For shortness of breath.).    Yes Historical Provider, MD  tolterodine (DETROL) 2 MG tablet Take 2 mg by mouth every morning.    Yes Historical Provider, MD  traMADol (ULTRAM) 50 MG tablet Take 100 mg by mouth every 8 (eight) hours as needed for pain.    Yes Historical Provider, MD   Allergies  Allergen Reactions  . Celebrex [Celecoxib] Shortness Of Breath    No voiding  . Penicillins Shortness Of Breath and Rash  . Smz-Tmp Ds [Sulfamethoxazole W/Trimethoprim (Co-Trimoxazole)] Rash  . Ceclor [Cefaclor] Other (See Comments)    Stomach pain  . Ciprofloxacin Hcl     Possible GI Bleed.  . Diclofenac Sodium     Pt doesn't remember reaction  . Doxycycline     Stomach pains.  . Flagyl [Metronidazole Hcl]     Vaginal rash  . Hydrocodone Other (See Comments)    Severe stomach pain  . Iohexol      Code: HIVES, Desc: pt states she breaks out in hives and  sneezes 10/01/08   Desc: STATES SHE ONLY REACTS BY SNEEZING   . Other Other (See Comments)    Darvocet - liver damage  . Percocet [Oxycodone-Acetaminophen] Other (See Comments)    Liver damage  . Prilosec [Omeprazole] Other (See Comments)    Causes ulcers in stomach and esophagus  . Tylenol [Acetaminophen] Other (See Comments)    Liver damage    FAMILY HISTORY:  Family History  Problem  Relation Age of Onset  . Anesthesia problems Neg Hx   . Hypotension Neg Hx   . Malignant hyperthermia Neg Hx   . Pseudochol deficiency Neg Hx    SOCIAL HISTORY:  reports that she quit smoking about 2 years ago. Her smoking use included Cigarettes. She has a 55 pack-year smoking history. She has never used smokeless tobacco. She reports that she does not drink alcohol or use illicit drugs.  REVIEW OF SYSTEMS:  10 point review of system taken, please see HPI for positives and negatives.   SUBJECTIVE:   VITAL SIGNS: Temp:  [97.5 F (36.4 C)-97.7 F (36.5 C)] 97.5 F (36.4 C) (04/07 0523) Pulse Rate:  [58-63] 58 (04/07 0523) Resp:  [20-24] 24 (04/07 0523) BP: (130-136)/(66-91) 130/66 mmHg (04/07 0523) SpO2:  [86 %-98 %] 95 % (04/07 1355) FiO2 (%):  [45 %-55 %] 45 % (04/07 1355) HEMODYNAMICS:   VENTILATOR SETTINGS: Vent Mode:  [-]  FiO2 (%):  [45 %-55 %] 45 % INTAKE / OUTPUT: Intake/Output     04/06 0701 - 04/07 0700 04/07 0701 - 04/08 0700   P.O. 800 360   I.V. (mL/kg)     IV Piggyback     Total Intake(mL/kg) 800 (14.5) 360 (6.5)   Urine (mL/kg/hr) 1500 (1.1) 500 (1.1)   Total Output 1500 500   Net -700 -140          PHYSICAL EXAMINATION: General:  Frail thin chronically ill appearing female  Neuro:  INTACT HEENT: No LAN/JVD Cardiovascular:  HSD Lungs: Exp wheezes, poor air movement  Abdomen: Soft +bs Musculoskeletal:  Intact Skin:  Warm rt foot unremarkable, no edema  LABS:  CBC  Recent Labs Lab 11/15/13 0800 11/17/13 0510  WBC 10.8* 9.0  HGB 15.7* 14.2  HCT  48.6* 43.7  PLT 167 177   Coag's  Recent Labs Lab 11/15/13 0800  INR 1.70*   BMET  Recent Labs Lab 11/15/13 0800 11/17/13 0510  NA 135* 137  K 3.5* 5.1  CL 92* 96  CO2 33* 30  BUN 14 43*  CREATININE 1.06 1.68*  GLUCOSE 103* 102*   Electrolytes  Recent Labs Lab 11/15/13 0800 11/17/13 0510  CALCIUM 10.1 9.0   Sepsis Markers  Recent Labs Lab 11/15/13 0816  LATICACIDVEN 1.01   ABG  Recent Labs Lab 11/15/13 0758  PHART 7.381  PCO2ART 53.7*  PO2ART 74.5*   Liver Enzymes  Recent Labs Lab 11/17/13 0510  AST 19  ALT 11  ALKPHOS 90  BILITOT 0.3  ALBUMIN 2.7*   Cardiac Enzymes  Recent Labs Lab 11/15/13 0800  PROBNP 1718.0*   Glucose No results found for this basename: GLUCAP,  in the last 168 hours  Imaging Dg Chest 2 View  11/17/2013   CLINICAL DATA:  Cough.  Shortness of breath  EXAM: CHEST  2 VIEW  COMPARISON:  DG CHEST 1V PORT dated 11/15/2013; DG CHEST 2 VIEW dated 12/31/2012  FINDINGS: Mediastinum Edit hilar structures are normal. Poor inspiration with basilar atelectasis. Mild infiltrate medial right lung base cannot be excluded. No pleural effusion or pneumothorax. Heart size normal. Degenerative changes thoracic spine with the multiple compression fractures, these appear stable.  IMPRESSION: 1. Poor inspiration with mild basilar atelectasis, infiltrate and medial right lung base cannot be excluded. 2. Diffuse thoracic spine osteopenia, DJD, multiple compression fractures.   Electronically Signed   By: Marcello Moores  Register   On: 11/17/2013 10:15       ASSESSMENT / PLAN:  PULMONARY A: Acute on  chronic resp failure in setting of severe copd, bronchiectasis , seasonal changes. P:   O2 as needed Change to low dose IV steroids Continue levofloxacin. Continue BD's She was adamant about no intubation but is listed as a full code (she does not need intubation just asked the question just in case).  TODAY'S SUMMARY:  73 yo female with severe lung  disease, bronchiectasis, followed by K. Clance, dlco 41% severe obstruction and air trapping. She can walk around the house and has not been out to store in 2 years. 1 week she had foot surgery and noted URI sxs 1 day later.  She was admitted 4/5 with congestion and sob, tx with abx , O2(she has been o2 dependent before) along with  PO steroids.  4/7 early am she had increasing WOB, cough but has improved with 35% vent mask and is in nad currently. PCCM asked to evaluate.  Patient seen and examined, agree with above note.  I dictated the care and orders written for this patient under my direction.  Rush Farmer, MD [phone]

## 2013-11-17 NOTE — Progress Notes (Signed)
Subjective: Katherine Mckay had a tough night. She desaturated to the 88% range he required more supplemental oxygen and breathing treatments overnight. At the moment she still feels short of breath in the sitting up with a breathing treatment in place. [name] had more productive coughing and increased work of breathing. Her by mouth intake has been fair at best. Her bowels have not worked. [name] had no fever. Her chronic pain is at baseline.   Objective: Vital signs in last 24 hours: Temp:  [97.5 F (36.4 C)-98.1 F (36.7 C)] 97.5 F (36.4 C) (04/07 0523) Pulse Rate:  [58-65] 58 (04/07 0523) Resp:  [18-24] 24 (04/07 0523) BP: (101-136)/(44-91) 130/66 mmHg (04/07 0523) SpO2:  [86 %-98 %] 97 % (04/07 0625) FiO2 (%):  [55 %] 55 % (04/07 0625)  Intake/Output from previous day: 04/06 0701 - 04/07 0700 In: 800 [P.O.:800] Out: 1500 [Urine:1500] Intake/Output this shift:    For a chronically ill-appearing white female sitting up in oxygen in place. Face is symmetric. Lungs reveal diffuse rhonchi and a few wheezes. She is quite kyphotic. Heart is faster and fairly regular. Abdomen soft nontender. Trace edema is present. She is awake alert and slightly more anxious.  Lab Results   Recent Labs  11/15/13 0800 11/17/13 0510  WBC 10.8* 9.0  RBC 4.77 4.32  HGB 15.7* 14.2  HCT 48.6* 43.7  MCV 101.9* 101.2*  MCH 32.9 32.9  RDW 14.2 14.2  PLT 167 177    Recent Labs  11/15/13 0800 11/17/13 0510  NA 135* 137  K 3.5* 5.1  CL 92* 96  CO2 33* 30  GLUCOSE 103* 102*  BUN 14 43*  CREATININE 1.06 1.68*  CALCIUM 10.1 9.0    Studies/Results: Ct Head Wo Contrast  11/15/2013   CLINICAL DATA:  Headaches  EXAM: CT HEAD WITHOUT CONTRAST  TECHNIQUE: Contiguous axial images were obtained from the base of the skull through the vertex without intravenous contrast.  COMPARISON:  12/27/2012  FINDINGS: Bony calvarium is intact. Mild atrophic changes are again identified. Scattered areas of decreased  attenuation identified consistent with chronic white matter ischemic change. Basal ganglia lacunar infarcts are again identified bilaterally. No findings to suggest acute hemorrhage, acute infarction or space-occupying mass lesion are noted.  IMPRESSION: Chronic changes without acute abnormality.   Electronically Signed   By: Inez Catalina M.D.   On: 11/15/2013 08:43   Dg Chest Port 1 View  11/15/2013   CLINICAL DATA:  Shortness of breath  EXAM: PORTABLE CHEST - 1 VIEW  COMPARISON:  12/31/2012  FINDINGS: Cardiac shadow is within normal limits. Diffuse interstitial changes are noted throughout both lungs. No focal infiltrate is seen. No acute bony abnormality is noted.  IMPRESSION: Chronic changes without acute abnormality.   Electronically Signed   By: Inez Catalina M.D.   On: 11/15/2013 08:24    Scheduled Meds: . albuterol  2.5 mg Nebulization Q6H WA  . ALIGN  1 capsule Oral q morning - 10a  . amLODipine  2.5 mg Oral QHS  . atorvastatin  40 mg Oral QPM  . DULoxetine  60 mg Oral q morning - 10a  . famotidine  20 mg Oral Daily  . fentaNYL  75 mcg Transdermal QODAY  . gabapentin  300 mg Oral BID  . guaiFENesin  600 mg Oral BID  . levofloxacin (LEVAQUIN) IV  750 mg Intravenous Q48H  . mometasone-formoterol  2 puff Inhalation BID  . nadolol  20 mg Oral QPM  . potassium chloride  10 mEq Oral q morning - 10a  . predniSONE  30 mg Oral QAC breakfast  . Rivaroxaban  20 mg Oral Q supper  . traMADol  100 mg Oral 4 times per day   Continuous Infusions:  PRN Meds:albuterol, ALPRAZolam, ondansetron (ZOFRAN) IV, ondansetron, oxyCODONE-acetaminophen, polyethylene glycol, tiotropium  Assessment/Plan: #1 acute respiratory failure secondary to exacerbation of COPD : Clearly did poorly overnight. Will recheck chest x-ray. We'll get pulmonary involved. May need to go back on IV steroids #2 acute bronchitis secondary URI no obvious evidence of pneumonia at this time . Repeat x-ray #3 osteoporosis with chronic  pain and compression fractures on Forteo and narcotics  #4 chronic constipation: doing OK  #5 essential hypertension : BP OK overall  #6 gastroesophageal reflux disease : on Rx  #7 protein calorie malnutrition: a chronic issue  #8 dehydration: Creatinine is up to 1.68. Will increase fluids.    LOS: 2 days   Mahalie Kanner [name] 11/17/2013, 7:48 AM

## 2013-11-18 DIAGNOSIS — J441 Chronic obstructive pulmonary disease with (acute) exacerbation: Principal | ICD-10-CM

## 2013-11-18 LAB — BASIC METABOLIC PANEL
BUN: 35 mg/dL — ABNORMAL HIGH (ref 6–23)
CALCIUM: 8.7 mg/dL (ref 8.4–10.5)
CO2: 28 mEq/L (ref 19–32)
Chloride: 105 mEq/L (ref 96–112)
Creatinine, Ser: 1.19 mg/dL — ABNORMAL HIGH (ref 0.50–1.10)
GFR, EST AFRICAN AMERICAN: 52 mL/min — AB (ref >90)
GFR, EST NON AFRICAN AMERICAN: 45 mL/min — AB (ref >90)
Glucose, Bld: 125 mg/dL — ABNORMAL HIGH (ref 70–99)
Potassium: 4.3 mEq/L (ref 3.7–5.3)
Sodium: 144 mEq/L (ref 137–147)

## 2013-11-18 MED ORDER — METHYLPREDNISOLONE SODIUM SUCC 40 MG IJ SOLR
40.0000 mg | INTRAMUSCULAR | Status: DC
  Administered 2013-11-19: 40 mg via INTRAVENOUS
  Filled 2013-11-18: qty 1

## 2013-11-18 NOTE — Progress Notes (Signed)
Subjective: Had a better night. Had a large BM. Less cough and less production of sputum.no pain. Hasn't really mobilized yet.   Wishes to avoid spiriva, felt it led to a prior pneumonia   Objective: Vital signs in last 24 hours: Temp:  [98.2 F (36.8 C)-98.3 F (36.8 C)] 98.3 F (36.8 C) (04/08 4128) Pulse Rate:  [62-70] 70 (04/08 0613) Resp:  [20-24] 20 (04/08 0613) BP: (103-147)/(61-78) 147/61 mmHg (04/08 0613) SpO2:  [91 %-100 %] 96 % (04/08 0722) FiO2 (%):  [45 %] 45 % (04/07 1931)  Intake/Output from previous day: 04/07 0701 - 04/08 0700 In: 2635 [P.O.:480; I.V.:2155] Out: 950 [Urine:950] Intake/Output this shift:    Lying on right side. VM in place. Right mid and lower wheeze and rhonchi, marked kyphosis. abd soft NT. Extrems: no edema. Alert, clear speech, sl tremor  Lab Results   Recent Labs  11/15/13 0800 11/17/13 0510  WBC 10.8* 9.0  RBC 4.77 4.32  HGB 15.7* 14.2  HCT 48.6* 43.7  MCV 101.9* 101.2*  MCH 32.9 32.9  RDW 14.2 14.2  PLT 167 177    Recent Labs  11/17/13 0510 11/18/13 0507  NA 137 144  K 5.1 4.3  CL 96 105  CO2 30 28  GLUCOSE 102* 125*  BUN 43* 35*  CREATININE 1.68* 1.19*  CALCIUM 9.0 8.7    Studies/Results: Dg Chest 2 View  11/17/2013   CLINICAL DATA:  Cough.  Shortness of breath  EXAM: CHEST  2 VIEW  COMPARISON:  DG CHEST 1V PORT dated 11/15/2013; DG CHEST 2 VIEW dated 12/31/2012  FINDINGS: Mediastinum Edit hilar structures are normal. Poor inspiration with basilar atelectasis. Mild infiltrate medial right lung base cannot be excluded. No pleural effusion or pneumothorax. Heart size normal. Degenerative changes thoracic spine with the multiple compression fractures, these appear stable.  IMPRESSION: 1. Poor inspiration with mild basilar atelectasis, infiltrate and medial right lung base cannot be excluded. 2. Diffuse thoracic spine osteopenia, DJD, multiple compression fractures.   Electronically Signed   By: Marcello Moores  Register   On:  11/17/2013 10:15    Scheduled Meds: . albuterol  2.5 mg Nebulization Q6H WA  . ALIGN  1 capsule Oral q morning - 10a  . amLODipine  2.5 mg Oral QHS  . atorvastatin  40 mg Oral QPM  . DULoxetine  60 mg Oral q morning - 10a  . famotidine  20 mg Oral Daily  . fentaNYL  75 mcg Transdermal QODAY  . gabapentin  300 mg Oral BID  . guaiFENesin  600 mg Oral BID  . levofloxacin (LEVAQUIN) IV  750 mg Intravenous Q48H  . methylPREDNISolone (SOLU-MEDROL) injection  40 mg Intravenous Q12H  . mometasone-formoterol  2 puff Inhalation BID  . nadolol  20 mg Oral QPM  . Rivaroxaban  20 mg Oral Q supper  . traMADol  100 mg Oral 4 times per day   Continuous Infusions: . sodium chloride 100 mL/hr at 11/18/13 0719   PRN Meds:albuterol, ALPRAZolam, ondansetron (ZOFRAN) IV, ondansetron, oxyCODONE-acetaminophen, polyethylene glycol  Assessment/Plan:  #1 acute respiratory failure secondary to exacerbation of COPD : pulm help appreciated. Back on low dose solumedro. #2 acute bronchitis : now possibly a right sided infiltrate #3 osteoporosis with chronic pain and compression fractures on Forteo and narcotics  #4 chronic constipation: doing OK  #5 essential hypertension : BP OK overall  #6 gastroesophageal reflux disease : on Rx  #7 protein calorie malnutrition: a chronic issue  #8 dehydration: Creatinine better at 1.19 #  9 DNR    LOS: 3 days   [place] 11/18/2013, 7:51 AM

## 2013-11-18 NOTE — Progress Notes (Signed)
PULMONARY / CRITICAL CARE MEDICINE   Name: Katherine Mckay MRN: [id number]  DOB: 07-03-1941    ADMISSION DATE:  11/15/2013 CONSULTATION DATE: 4/7  [place] : [place] PRIMARY SERVICE: [facility]  CHIEF COMPLAINT: SOB  BRIEF PATIENT DESCRIPTION:  73 yo ex smoker with severe COPD, bronchiectasis, followed by K. Clance, dlco 41% severe obstruction and air trapping. She can walk around the house and has not been out to store in 2 years. 1 week she had foot surgery and noted URI sxs 1 day later.  She was admitted 4/5 with congestion and sob, tx with abx , O2(she has been o2 dependent before-taken off in 2012 ) along with  PO steroids.  4/7 early am she had increasing WOB, cough but has improved with 35% vent mask  PCCM asked to evaluate.  SIGNIFICANT EVENTS / STUDIES:    LINES / TUBES:   CULTURES:   ANTIBIOTICS: 4/6 levaquin>>   SUBJECTIVE: breathing better Green sputum afebrile  VITAL SIGNS: Temp:  [98.2 F (36.8 C)-98.3 F (36.8 C)] 98.3 F (36.8 C) (04/08 5643) Pulse Rate:  [62-70] 70 (04/08 0613) Resp:  [20-24] 20 (04/08 0613) BP: (103-147)/(61-78) 147/61 mmHg (04/08 0613) SpO2:  [91 %-100 %] 96 % (04/08 0722) FiO2 (%):  [45 %] 45 % (04/07 1931) HEMODYNAMICS:   VENTILATOR SETTINGS: Vent Mode:  [-]  FiO2 (%):  [45 %] 45 % INTAKE / OUTPUT: Intake/Output     04/07 0701 - 04/08 0700 04/08 0701 - 04/09 0700   P.O. 480    I.V. (mL/kg) 2155 (39)    Total Intake(mL/kg) 2635 (47.7)    Urine (mL/kg/hr) 950 (0.7)    Total Output 950     Net +1685          Urine Occurrence 1 x    Stool Occurrence 1 x      PHYSICAL EXAMINATION: General:  Frail thin chronically ill appearing female , feels better today Neuro:  INTACT HEENT: No LAN/JVD Cardiovascular:  HSD Lungs: decreased wheezes, increased air movement at bases, severe kyphosis Abdomen: Soft +bs Musculoskeletal:  Intact Skin:  Warm rt foot unremarkable, no edema  LABS:  CBC  Recent Labs Lab 11/15/13 0800  11/17/13 0510  WBC 10.8* 9.0  HGB 15.7* 14.2  HCT 48.6* 43.7  PLT 167 177   Coag's  Recent Labs Lab 11/15/13 0800  INR 1.70*   BMET  Recent Labs Lab 11/15/13 0800 11/17/13 0510 11/18/13 0507  NA 135* 137 144  K 3.5* 5.1 4.3  CL 92* 96 105  CO2 33* 30 28  BUN 14 43* 35*  CREATININE 1.06 1.68* 1.19*  GLUCOSE 103* 102* 125*   Electrolytes  Recent Labs Lab 11/15/13 0800 11/17/13 0510 11/18/13 0507  CALCIUM 10.1 9.0 8.7   Sepsis Markers  Recent Labs Lab 11/15/13 0816  LATICACIDVEN 1.01   ABG  Recent Labs Lab 11/15/13 0758  PHART 7.381  PCO2ART 53.7*  PO2ART 74.5*   Liver Enzymes  Recent Labs Lab 11/17/13 0510  AST 19  ALT 11  ALKPHOS 90  BILITOT 0.3  ALBUMIN 2.7*   Cardiac Enzymes  Recent Labs Lab 11/15/13 0800  PROBNP 1718.0*   Glucose No results found for this basename: GLUCAP,  in the last 168 hours  Imaging Dg Chest 2 View  11/17/2013   CLINICAL DATA:  Cough.  Shortness of breath  EXAM: CHEST  2 VIEW  COMPARISON:  DG CHEST 1V PORT dated 11/15/2013; DG CHEST 2 VIEW dated 12/31/2012  FINDINGS: Mediastinum  Edit hilar structures are normal. Poor inspiration with basilar atelectasis. Mild infiltrate medial right lung base cannot be excluded. No pleural effusion or pneumothorax. Heart size normal. Degenerative changes thoracic spine with the multiple compression fractures, these appear stable.  IMPRESSION: 1. Poor inspiration with mild basilar atelectasis, infiltrate and medial right lung base cannot be excluded. 2. Diffuse thoracic spine osteopenia, DJD, multiple compression fractures.   Electronically Signed   By: Marcello Moores  Register   On: 11/17/2013 10:15       ASSESSMENT / PLAN:  PULMONARY A: Acute on chronic resp failure in setting of severe copd, bronchiectasis , seasonal changes. P:   O2 as needed -satn 88% & above Dc cont pulse ox & do spot checks Change to low dose IV steroids(better 4/8) continue IV steroids for another day then  change back to PO and taper. Add flutter valve. Continue levofloxacin. Continue BD's DNR noted Does not want spiriva -resume steroid/LABA on discharge (dulera)  Stool occult blood pos - per PCP  TODAY'S SUMMARY:  73 yo female with severe lung disease, bronchiectasis,  dlco 41% severe obstruction and air trapping. 4/8 add flutter valve. Back on nasal cannula. She will likely need O2 on discharge this time She was followed by K. Clance  But would like to see different pulmonologist for Fu in our office Long discussion with pt & husband  Kara Mead MD. [facility]. [facility] Pager [phone] If no response call [phone]   11/18/2013, 10:07 AM

## 2013-11-18 NOTE — Progress Notes (Signed)
Pt on a continuous pulse ox. Sats 95% on 6lpm [place].

## 2013-11-19 LAB — COMPREHENSIVE METABOLIC PANEL
ALK PHOS: 71 U/L (ref 39–117)
ALT: 12 U/L (ref 0–35)
AST: 18 U/L (ref 0–37)
Albumin: 2.2 g/dL — ABNORMAL LOW (ref 3.5–5.2)
BUN: 24 mg/dL — ABNORMAL HIGH (ref 6–23)
CALCIUM: 8.6 mg/dL (ref 8.4–10.5)
CO2: 28 meq/L (ref 19–32)
Chloride: 108 mEq/L (ref 96–112)
Creatinine, Ser: 0.84 mg/dL (ref 0.50–1.10)
GFR calc Af Amer: 80 mL/min — ABNORMAL LOW (ref >90)
GFR calc non Af Amer: 69 mL/min — ABNORMAL LOW (ref >90)
Glucose, Bld: 94 mg/dL (ref 70–99)
POTASSIUM: 4.1 meq/L (ref 3.7–5.3)
SODIUM: 144 meq/L (ref 137–147)
Total Bilirubin: 0.3 mg/dL (ref 0.3–1.2)
Total Protein: 5.5 g/dL — ABNORMAL LOW (ref 6.0–8.3)

## 2013-11-19 LAB — CBC
HEMATOCRIT: 41.1 % (ref 36.0–46.0)
HEMOGLOBIN: 13 g/dL (ref 12.0–15.0)
MCH: 32.5 pg (ref 26.0–34.0)
MCHC: 31.6 g/dL (ref 30.0–36.0)
MCV: 102.8 fL — ABNORMAL HIGH (ref 78.0–100.0)
Platelets: 176 10*3/uL (ref 150–400)
RBC: 4 MIL/uL (ref 3.87–5.11)
RDW: 14.5 % (ref 11.5–15.5)
WBC: 9.6 10*3/uL (ref 4.0–10.5)

## 2013-11-19 MED ORDER — PREDNISONE 20 MG PO TABS
40.0000 mg | ORAL_TABLET | Freq: Every day | ORAL | Status: DC
  Administered 2013-11-20 – 2013-11-21 (×2): 40 mg via ORAL
  Filled 2013-11-19 (×3): qty 2

## 2013-11-19 NOTE — Progress Notes (Addendum)
11/19/13 1606 SATURATION QUALIFICATIONS: (This note is used to comply with regulatory documentation for home oxygen)  Patient Saturations on Room Air at Rest = 90%  Patient Saturations on Room Air while Ambulating = 86% x 10'  Patient Saturations on 4Liters of oxygen while Ambulating = 80% x 70'  Please briefly explain why patient needs home oxygen:Desaturation  without Gem PT [phone]

## 2013-11-19 NOTE — Evaluation (Signed)
Physical Therapy Evaluation Patient Details Name: Katherine Mckay MRN: [id number] DOB: 1941-07-08 Today's Date: 11/19/2013   History of Present Illness  73 yo female admitted 11/15/13 with increased SOB, hypoxia, COPD exacerbation.  Clinical Impression  Pt participated in ambulation and mobilizes well with RW, with noted desaturation on 4 l. O2 to 80%.  Pt will benefit from PT to address problems listed.   Follow Up Recommendations Home health PT    Equipment Recommendations  None recommended by PT    Recommendations for Other Services       Precautions / Restrictions Precautions Precautions: Fall Precaution Comments: monitor sats, O2 dependent      Mobility  Bed Mobility Overal bed mobility: Modified Independent                Transfers   Equipment used: Rolling walker (2 wheeled)             General transfer comment: cues for safety, rest breaks for dyspnea  Ambulation/Gait Ambulation/Gait assistance: Min guard Ambulation Distance (Feet): 20 Feet (then 70') Assistive device: Rolling walker (2 wheeled) Gait Pattern/deviations: Step-through pattern Gait velocity: slow,   General Gait Details: cues for pursed lip breathing.  Stairs            Wheelchair Mobility    Modified Rankin (Stroke Patients Only)       Balance                                             Pertinent Vitals/Pain Pain is all over- makes her "shake" more.  Pre 96% 4 l amb x 10' on RA= 86% amb x 70' on 4 l=80% HR 110 Recovered to 92%     Home Living Family/patient expects to be discharged to:: Private residence Living Arrangements: Spouse/significant other Available Help at Discharge: Family Type of Home: House Home Access: Ramped entrance;Stairs to enter   Entrance Stairs-Number of Steps: 1 Home Layout: One level Home Equipment: [place] - 2 wheels;Cane - quad;Wheelchair - Brewing technologist - built in      Prior Function Level of Independence:  Independent with assistive device(s)               Hand Dominance        Extremity/Trunk Assessment   Upper Extremity Assessment: Generalized weakness           Lower Extremity Assessment: Generalized weakness      Cervical / Trunk Assessment: Kyphotic  Communication   Communication: No difficulties  Cognition Arousal/Alertness: Awake/alert Behavior During Therapy: WFL for tasks assessed/performed Overall Cognitive Status: Within Functional Limits for tasks assessed                      General Comments      Exercises        Assessment/Plan    PT Assessment Patient needs continued PT services  PT Diagnosis Difficulty walking   PT Problem List Decreased strength;Decreased activity tolerance;Cardiopulmonary status limiting activity;Pain  PT Treatment Interventions DME instruction;Gait training;Functional mobility training;Therapeutic activities;Therapeutic exercise;Patient/family education   PT Goals (Current goals can be found in the Care Plan section) Acute Rehab PT Goals Patient Stated Goal: I want to go home soon PT Goal Formulation: With patient Time For Goal Achievement: 12/04/2013 Potential to Achieve Goals: Good    Frequency Min 3X/week   Barriers to discharge  Co-evaluation               End of Session Equipment Utilized During Treatment: Gait belt Activity Tolerance: Treatment limited secondary to medical complications (Comment) (dyspnea, decr. sats) Patient left: in bed Nurse Communication: Mobility status (desaturation)         Time: 7209-4709 PT Time Calculation (min): 24 min   Charges:   PT Evaluation $Initial PT Evaluation Tier I: 1 Procedure PT Treatments $Gait Training: 23-37 mins   PT G Codes:          Claretha Cooper 11/19/2013, 3:59 PM Tresa Endo PT [phone]

## 2013-11-19 NOTE — Progress Notes (Signed)
Rt gave flutter valve to pt. Pt knows and understands how to use.  

## 2013-11-19 NOTE — Progress Notes (Signed)
Patient ambulated in hallway 75 feet with walker and oxygen. Oxygen saturation before ambulation was 98% on 6L. While ambulating, oxygen saturation was 94% on 6L nasal cannula. Patient did have some mild shortness of breath and had to sit in chair to rest before going back to room. Patient sitting in chair at this time. Husband at bedside. Will continue to monitor patient. Pearline Cables Setzer

## 2013-11-19 NOTE — Progress Notes (Addendum)
Pt on continuous pulse ox. Sats 97% on 4LPM [place].

## 2013-11-19 NOTE — Progress Notes (Signed)
PULMONARY / CRITICAL CARE MEDICINE   Name: Katherine Mckay MRN: [id number]  DOB: January 20, 1941    ADMISSION DATE:  11/15/2013 CONSULTATION DATE: 4/7  REFERRING MD : [facility] PRIMARY SERVICE: [facility]  CHIEF COMPLAINT: SOB  BRIEF PATIENT DESCRIPTION: 73 y/o ex smoker with severe COPD, bronchiectasis, previously seen by Katherine Mckay (2 yr ago), dlco 41% severe obstruction and air trapping. She can walk around the house and has not been out to store in 2 years. 1 week she had foot surgery and noted URI sxs 1 day later.  She was admitted 4/5 with congestion and sob, tx with abx , O2 (she has been O2 dependent before-taken off in 2012 ) along with PO steroids.  4/7 early am she had increasing WOB, cough but has improved with 35% vent mask  PCCM asked to evaluate.  ANTIBIOTICS: 4/6 levaquin>>  SUBJECTIVE:  Pt reports feeling better, reduced wheezing  VITAL SIGNS: Temp:  [97.5 F (36.4 C)-98.1 F (36.7 C)] 97.6 F (36.4 C) (04/09 0453) Pulse Rate:  [55-63] 55 (04/09 0453) Resp:  [19-22] 19 (04/09 0453) BP: (122-153)/(62-70) 153/69 mmHg (04/09 0453) SpO2:  [96 %-100 %] 97 % (04/09 0714)  INTAKE / OUTPUT: Intake/Output     04/08 0701 - 04/09 0700 04/09 0701 - 04/10 0700   P.O. 1200 600   I.V. (mL/kg) 1350 (24.5)    Total Intake(mL/kg) 2550 (46.2) 600 (10.9)   Urine (mL/kg/hr) 1250 (0.9) 300 (1.4)   Total Output 1250 300   Net +1300 +300        Urine Occurrence 4 x    Stool Occurrence 1 x      PHYSICAL EXAMINATION: General:  Frail thin chronically ill appearing female, NAD Neuro:  INTACT HEENT: No LAN/JVD Cardiovascular:  HSD Lungs: decreased wheezes, increased air movement at bases, severe kyphosis Abdomen: Soft +bs Musculoskeletal:  Intact Skin:  Warm rt foot unremarkable, no edema  LABS:  CBC  Recent Labs Lab 11/15/13 0800 11/17/13 0510 11/19/13 0400  WBC 10.8* 9.0 9.6  HGB 15.7* 14.2 13.0  HCT 48.6* 43.7 41.1  PLT 167 177 176   BMET  Recent Labs Lab 11/17/13 0510  11/18/13 0507 11/19/13 0400  NA 137 144 144  K 5.1 4.3 4.1  CL 96 105 108  CO2 30 28 28   BUN 43* 35* 24*  CREATININE 1.68* 1.19* 0.84  GLUCOSE 102* 125* 94   Imaging No results found.  ASSESSMENT / PLAN:  A: Acute on Chronic Resp Failure - multifactorial / as below AECOPD Restrictive Disease - in setting of Kyphosis Bronchiectasis Seasonal Allergies  P:   O2 to maintain sat 88% & above, will need assessment prior to discharge Q4 check saturations Change steroids to prednisone as of 4/10 dosing and taper back to baseline dose of 5mg  (hx chrons & AI due to pred usage).  Flutter valve. Continue levofloxacin. Continue BD's DNR noted Does not want spiriva - resume steroid/LABA on discharge (dulera) + PRN albuterol (discussed appropriate use, pt thought she wasn't sick enough to use) Pulmonary follow up arranged with Katherine Edison, NP for hospital follow up but would like to see a different pulmonologist (has not been seen in 2 years, MD's to discuss change - she wants to see Dr. Elsworth Mckay)  A: Stool occult blood positivs. P: per PCP  Katherine Gens, NP-C [place] Pgr: [phone] or [phone]   11/19/2013, 10:56 AM   Reviewed above, examined pt.  Pulmonary status slowly improving.  Will transition to po  prednisone.  Will ask PT/OT to assess.  She will need to be assessed for home oxygen set up prior to d/c home.  Husband raised question of whether she could have scripts for Abx and prednisone to keep on hand at home if she develops recurrent exacerbation, and use to avoid getting into situation when she can't see a physician quickly enough to avoid needing hospitalization >> explained that this decision is at discretion of individual provider.  Have set her up for outpt pulmonary follow up with Katherine Mckay on 11/25/13 at 10:30 am.  Pt has requested to then change pulmonary follow up to Dr. Elsworth Mckay.  PCCM will sign off.  Please call if additional help is needed  while she is in hospital.  Katherine Mires, MD [place] 11/19/2013, 12:03 PM Pager:  [phone] After 3pm call: [phone]

## 2013-11-19 NOTE — Progress Notes (Signed)
Subjective: Still desating easily. Breathless. Some bloating. No chills or sweats. Hasn't been out of bed at all yet   Objective: Vital signs in last 24 hours: Temp:  [97.5 F (36.4 C)-98.1 F (36.7 C)] 97.6 F (36.4 C) (04/09 0453) Pulse Rate:  [55-63] 55 (04/09 0453) Resp:  [19-22] 19 (04/09 0453) BP: (122-153)/(62-70) 153/69 mmHg (04/09 0453) SpO2:  [96 %-100 %] 97 % (04/09 0714)  Intake/Output from previous day: 04/08 0701 - 04/09 0700 In: 2550 [P.O.:1200; I.V.:1350] Out: 1250 [Urine:1250] Intake/Output this shift: Total I/O In: 360 [P.O.:360] Out: 300 [Urine:300]  Sitting up with nasal O2. Lungs distant with less wheeze. Ht reg. abd sl distended. Good BS's awake. alert  Lab Results   Recent Labs  11/17/13 0510 11/19/13 0400  WBC 9.0 9.6  RBC 4.32 4.00  HGB 14.2 13.0  HCT 43.7 41.1  MCV 101.2* 102.8*  MCH 32.9 32.5  RDW 14.2 14.5  PLT 177 176    Recent Labs  11/18/13 0507 11/19/13 0400  NA 144 144  K 4.3 4.1  CL 105 108  CO2 28 28  GLUCOSE 125* 94  BUN 35* 24*  CREATININE 1.19* 0.84  CALCIUM 8.7 8.6    Studies/Results: Dg Chest 2 View  11/17/2013   CLINICAL DATA:  Cough.  Shortness of breath  EXAM: CHEST  2 VIEW  COMPARISON:  DG CHEST 1V PORT dated 11/15/2013; DG CHEST 2 VIEW dated 12/31/2012  FINDINGS: Mediastinum Edit hilar structures are normal. Poor inspiration with basilar atelectasis. Mild infiltrate medial right lung base cannot be excluded. No pleural effusion or pneumothorax. Heart size normal. Degenerative changes thoracic spine with the multiple compression fractures, these appear stable.  IMPRESSION: 1. Poor inspiration with mild basilar atelectasis, infiltrate and medial right lung base cannot be excluded. 2. Diffuse thoracic spine osteopenia, DJD, multiple compression fractures.   Electronically Signed   By: Marcello Moores  Register   On: 11/17/2013 10:15    Scheduled Meds: . albuterol  2.5 mg Nebulization Q6H WA  . ALIGN  1 capsule Oral q morning -  10a  . amLODipine  2.5 mg Oral QHS  . atorvastatin  40 mg Oral QPM  . DULoxetine  60 mg Oral q morning - 10a  . famotidine  20 mg Oral Daily  . fentaNYL  75 mcg Transdermal QODAY  . gabapentin  300 mg Oral BID  . guaiFENesin  600 mg Oral BID  . levofloxacin (LEVAQUIN) IV  750 mg Intravenous Q48H  . methylPREDNISolone (SOLU-MEDROL) injection  40 mg Intravenous Q24H  . mometasone-formoterol  2 puff Inhalation BID  . nadolol  20 mg Oral QPM  . Rivaroxaban  20 mg Oral Q supper  . traMADol  100 mg Oral 4 times per day   Continuous Infusions: . sodium chloride 50 mL/hr at 11/19/13 0056   PRN Meds:albuterol, ALPRAZolam, ondansetron (ZOFRAN) IV, ondansetron, oxyCODONE-acetaminophen, polyethylene glycol  Assessment/Plan:   #1 acute respiratory failure secondary to exacerbation of COPD : doing fair. Needs to mobilize #2 acute bronchitis : on IV levaquin #3 osteoporosis with chronic pain and compression fractures on Forteo and narcotics  #4 chronic constipation: doing OK  #5 essential hypertension : BP OK overall  #6 gastroesophageal reflux disease : on Rx  #7 protein calorie malnutrition: a chronic issue  #8 dehydration: Creatinine better at 0.84 #9 DNR #10 DISPOSITION: will mobilize and try to get out in 1-2 days. Still fragile oxygenation status   LOS: 4 days   [place] 11/19/2013, 9:03 AM

## 2013-11-20 ENCOUNTER — Inpatient Hospital Stay (HOSPITAL_COMMUNITY): Payer: Medicare Other

## 2013-11-20 LAB — BASIC METABOLIC PANEL
BUN: 25 mg/dL — AB (ref 6–23)
CO2: 27 mEq/L (ref 19–32)
CREATININE: 0.91 mg/dL (ref 0.50–1.10)
Calcium: 8.5 mg/dL (ref 8.4–10.5)
Chloride: 107 mEq/L (ref 96–112)
GFR calc Af Amer: 72 mL/min — ABNORMAL LOW (ref >90)
GFR, EST NON AFRICAN AMERICAN: 62 mL/min — AB (ref >90)
GLUCOSE: 94 mg/dL (ref 70–99)
Potassium: 4.6 mEq/L (ref 3.7–5.3)
Sodium: 143 mEq/L (ref 137–147)

## 2013-11-20 LAB — URINALYSIS, ROUTINE W REFLEX MICROSCOPIC
BILIRUBIN URINE: NEGATIVE
Glucose, UA: NEGATIVE mg/dL
KETONES UR: NEGATIVE mg/dL
Leukocytes, UA: NEGATIVE
Nitrite: NEGATIVE
PROTEIN: NEGATIVE mg/dL
Specific Gravity, Urine: 1.014 (ref 1.005–1.030)
UROBILINOGEN UA: 0.2 mg/dL (ref 0.0–1.0)
pH: 5.5 (ref 5.0–8.0)

## 2013-11-20 LAB — URINE MICROSCOPIC-ADD ON

## 2013-11-20 NOTE — Progress Notes (Signed)
Physical Therapy Treatment Patient Details Name: JACOLE CAPLEY MRN: [id number] DOB: 1940-10-20 Today's Date: 11/20/2013    History of Present Illness 73 yo female admitted 11/15/13 with increased SOB, hypoxia, COPD exacerbation.    PT Comments    Pt in bed on 4 lts sats avg 88% at rest.  Pt sat EOB Indep.  Mild Dyspnea.  Required sitting EOB several min before amb attempt. Amb limited distance and needed to increase O2 to 6 lts to achieve sats at 88%.  Limited activity tolerance as pt required sitting rest breaks between each activity.  Amb twice then assited to [facility] then back to bed.  Returned pt to 4 lts.   Follow Up Recommendations  Home health PT     Equipment Recommendations  None recommended by PT    Recommendations for Other Services       Precautions / Restrictions Precautions Precautions: Fall Precaution Comments: monitor sats, O2 dependent Restrictions Weight Bearing Restrictions: No    Mobility  Bed Mobility Overal bed mobility: Modified Independent             General bed mobility comments: increased time  Transfers Overall transfer level: Needs assistance Equipment used: Rolling walker (2 wheeled) Transfers: Sit to/from Stand Sit to Stand: Supervision         General transfer comment: rest breaks for dyspnea.  Assist on/off bed and also on/off BSC.  Good use of hands and safety cognition.  Ambulation/Gait Ambulation/Gait assistance: Min guard Ambulation Distance (Feet): 80 Feet (40' x 2 one sitting rest break) Assistive device: Rolling walker (2 wheeled) Gait Pattern/deviations: Decreased stride length;Trunk flexed Gait velocity: decreased   General Gait Details: Amb on 4 lts sats decreased to 84% with poor return despite rest break and purse lip breathing.  Increased to 6 lts (just for amb) sats avg 89%.  DOE remains 3/4.  Very limited activity tolerance.  Requires freq rest breaks after each activity.     Stairs            Wheelchair  Mobility    Modified Rankin (Stroke Patients Only)       Balance                                    Cognition                            Exercises      General Comments        Pertinent Vitals/Pain     Home Living                      Prior Function            PT Goals (current goals can now be found in the care plan section) Progress towards PT goals: Progressing toward goals    Frequency  Min 3X/week    PT Plan      Co-evaluation             End of Session Equipment Utilized During Treatment: Gait belt;Oxygen Activity Tolerance: Treatment limited secondary to medical complications (Comment) (dyspnea)       Time: 9323-5573 PT Time Calculation (min): 25 min  Charges:  $Gait Training: 8-22 mins $Therapeutic Activity: 8-22 mins                    G  Codes:      Rica Koyanagi  PTA [facility]  Acute  Rehab Pager      [phone]

## 2013-11-20 NOTE — Evaluation (Signed)
Occupational Therapy Evaluation Patient Details Name: Katherine Mckay MRN: [id number] DOB: 08/28/1940 Today's Date: 11/20/2013    History of Present Illness 73 yo female admitted 11/15/13 with increased SOB, hypoxia, COPD exacerbation.   Clinical Impression   Pt admitted for above diagnosis and has the deficits listed below. Pt would benefit from cont OT to increase I energy conservation techniques she could be using for adls so she can be more efficient at home living with her husband.    Follow Up Recommendations  Home health OT;Supervision/Assistance - 24 hour    Equipment Recommendations  None recommended by OT    Recommendations for Other Services       Precautions / Restrictions Precautions Precautions: Fall Precaution Comments: monitor sats, O2 dependent Restrictions Weight Bearing Restrictions: No      Mobility Bed Mobility Overal bed mobility: Modified Independent                Transfers Overall transfer level: Needs assistance Equipment used: Rolling walker (2 wheeled) Transfers: Sit to/from [employer] Transfers Sit to Stand: Supervision Stand pivot transfers: Supervision       General transfer comment: cues for safety, rest breaks for dyspnea    Balance Overall balance assessment: No apparent balance deficits (not formally assessed)                                          ADL Overall ADL's : Needs assistance/impaired Eating/Feeding: Independent   Grooming: Wash/dry hands;Wash/dry face;Oral care;Applying deodorant;Brushing hair;Set up;Sitting Grooming Details (indicate cue type and reason): due to O2 sat levels being low, did not have pt stand for all activities (although she is able)bc her stats would drop. Upper Body Bathing: Set up;Sitting   Lower Body Bathing: Set up;Sit to/from stand Lower Body Bathing Details (indicate cue type and reason): pt safe when standing overall.  One cue to not hold to moving table.  Pt  did not lose balance but fatigued quickly. Upper Body Dressing : Set up;Sitting   Lower Body Dressing: Set up;Sit to/from stand   Toilet Transfer: Media planner Details (indicate cue type and reason): assist only b/c of IV lines and O2 lines Toileting- Clothing Manipulation and Hygiene: Supervision/safety;Sit to/from stand Toileting - Clothing Manipulation Details (indicate cue type and reason): assist more for lines than pt safety     Functional mobility during ADLs: Supervision/safety;Rolling walker General ADL Comments: Pt overall does very well with adls outside of becoming very fatigued.  Pt was safe on her feet but just cannot tolerate a lot of activity.      Vision                     Perception     Praxis      Pertinent Vitals/Pain O2 sats on 4L initially at 87% after doing her bath.  With deep pursed lip breathing she recovered to 93%.       Hand Dominance Right   Extremity/Trunk Assessment Upper Extremity Assessment Upper Extremity Assessment: Generalized weakness   Lower Extremity Assessment Lower Extremity Assessment: Defer to PT evaluation   Cervical / Trunk Assessment Cervical / Trunk Assessment: Kyphotic   Communication Communication Communication: No difficulties   Cognition Arousal/Alertness: Awake/alert Behavior During Therapy: WFL for tasks assessed/performed Overall Cognitive Status: Within Functional Limits for tasks assessed  General Comments       Exercises       Shoulder Instructions      Home Living Family/patient expects to be discharged to:: Private residence Living Arrangements: Spouse/significant other Available Help at Discharge: Family Type of Home: House Home Access: Ramped entrance;Stairs to enter Entrance Stairs-Number of Steps: 1   Home Layout: One level     Bathroom Shower/Tub: Tub/shower unit;Walk-in shower;Curtain Shower/tub characteristics:  Architectural technologist: Standard Bathroom Accessibility: Yes How Accessible: Accessible via walker Home Equipment: [place] - 2 wheels;Wheelchair - manual;Shower seat - built in;Shower seat;Bedside commode;Grab bars - toilet;Grab bars - tub/shower;Hand held shower head          Prior Functioning/Environment Level of Independence: Independent with assistive device(s)        Comments: helps with all laundry and cooking/cleaning    OT Diagnosis: Generalized weakness   OT Problem List: Decreased activity tolerance;Decreased strength;Decreased knowledge of use of DME or AE;Pain   OT Treatment/Interventions: Self-care/ADL training;Energy conservation;DME and/or AE instruction    OT Goals(Current goals can be found in the care plan section) Acute Rehab OT Goals Patient Stated Goal: I want to go home soon OT Goal Formulation: With patient Time For Goal Achievement: 11/27/13 Potential to Achieve Goals: Good ADL Goals Pt Will Perform Grooming: with modified independence;standing Additional ADL Goal #1: Pt will gather all clothes and dress using rolling walker and using energy conservation techniques to be more efficient. Additional ADL Goal #2: Pt will toilet with mod I with 3:1 over commode. Additional ADL Goal #3: Pt wills state 3 new things she can implement at home to attempt to conserve energy with her  home skills.  OT Frequency: Min 2X/week   Barriers to D/C:            Co-evaluation              End of Session Equipment Utilized During Treatment: Oxygen Nurse Communication: Mobility status  Activity Tolerance: Patient limited by fatigue Patient left: in chair;with call bell/phone within reach;with chair alarm set   Time: 3546-5681 OT Time Calculation (min): 35 min Charges:  OT General Charges $OT Visit: 1 Procedure OT Evaluation $Initial OT Evaluation Tier I: 1 Procedure OT Treatments $Self Care/Home Management : 23-37 mins G-Codes:    Vickki Muff 11-30-13, 10:28 AM [phone]

## 2013-11-20 NOTE — Progress Notes (Signed)
Subjective: Had a hard night. Now some urinary pain and hesitancy and more neck pain. 2 loose stools.Breathing fair   Objective: Vital signs in last 24 hours: Temp:  [97.3 F (36.3 C)-98.2 F (36.8 C)] 97.3 F (36.3 C) (04/10 0504) Pulse Rate:  [64-80] 80 (04/10 0504) Resp:  [18-20] 20 (04/10 0504) BP: (149-170)/(69-101) 170/69 mmHg (04/10 0542) SpO2:  [94 %-98 %] 95 % (04/10 0742)  Intake/Output from previous day: 04/09 0701 - 04/10 0700 In: 2240 [P.O.:840; I.V.:1100; IV Piggyback:300] Out: 1075 [Urine:1075] Intake/Output this shift:    Lying on right side, more pale. Lungs a bit more rhonchorous. Ht regular, abd distended with hyperactive BS". More withdrawn  Lab Results   Recent Labs  11/19/13 0400  WBC 9.6  RBC 4.00  HGB 13.0  HCT 41.1  MCV 102.8*  MCH 32.5  RDW 14.5  PLT 176    Recent Labs  11/19/13 0400 11/20/13 0435  NA 144 143  K 4.1 4.6  CL 108 107  CO2 28 27  GLUCOSE 94 94  BUN 24* 25*  CREATININE 0.84 0.91  CALCIUM 8.6 8.5    Studies/Results: No results found.  Scheduled Meds: . albuterol  2.5 mg Nebulization Q6H WA  . ALIGN  1 capsule Oral q morning - 10a  . amLODipine  2.5 mg Oral QHS  . atorvastatin  40 mg Oral QPM  . DULoxetine  60 mg Oral q morning - 10a  . famotidine  20 mg Oral Daily  . fentaNYL  75 mcg Transdermal QODAY  . gabapentin  300 mg Oral BID  . guaiFENesin  600 mg Oral BID  . levofloxacin (LEVAQUIN) IV  750 mg Intravenous Q48H  . mometasone-formoterol  2 puff Inhalation BID  . nadolol  20 mg Oral QPM  . predniSONE  40 mg Oral Q breakfast  . Rivaroxaban  20 mg Oral Q supper  . traMADol  100 mg Oral 4 times per day   Continuous Infusions: . sodium chloride 50 mL/hr at 11/19/13 0056   PRN Meds:albuterol, ALPRAZolam, ondansetron (ZOFRAN) IV, ondansetron, oxyCODONE-acetaminophen, polyethylene glycol  Assessment/Plan:  #1 acute respiratory failure secondary to exacerbation of COPD : check another CXR #2 acute  bronchitis : on IV levaquin  #3 osteoporosis with chronic pain and compression fractures on Forteo and narcotics  #4 chronic constipation: doing OK  #5 essential hypertension : BP OK overall  #6 gastroesophageal reflux disease : on Rx  #7 protein calorie malnutrition: a chronic issue  #8 dehydration: Creatinine better at 0.91 #9 DNR  #10 DISPOSITION: will mobilize and try to get out in 1-2 days. Still fragile oxygenation status DYSURIA: send UA    LOS: 5 days   [place] 11/20/2013, 8:20 AM

## 2013-11-21 LAB — CBC
HCT: 45.1 % (ref 36.0–46.0)
HEMOGLOBIN: 14.5 g/dL (ref 12.0–15.0)
MCH: 32.4 pg (ref 26.0–34.0)
MCHC: 32.2 g/dL (ref 30.0–36.0)
MCV: 100.9 fL — AB (ref 78.0–100.0)
Platelets: 189 10*3/uL (ref 150–400)
RBC: 4.47 MIL/uL (ref 3.87–5.11)
RDW: 14.4 % (ref 11.5–15.5)
WBC: 9.7 10*3/uL (ref 4.0–10.5)

## 2013-11-21 LAB — BASIC METABOLIC PANEL
BUN: 27 mg/dL — AB (ref 6–23)
CHLORIDE: 106 meq/L (ref 96–112)
CO2: 26 mEq/L (ref 19–32)
Calcium: 8.3 mg/dL — ABNORMAL LOW (ref 8.4–10.5)
Creatinine, Ser: 0.89 mg/dL (ref 0.50–1.10)
GFR, EST AFRICAN AMERICAN: 74 mL/min — AB (ref >90)
GFR, EST NON AFRICAN AMERICAN: 64 mL/min — AB (ref >90)
Glucose, Bld: 107 mg/dL — ABNORMAL HIGH (ref 70–99)
Potassium: 4.3 mEq/L (ref 3.7–5.3)
Sodium: 141 mEq/L (ref 137–147)

## 2013-11-21 MED ORDER — PREDNISONE 20 MG PO TABS: 40.0000 mg | ORAL_TABLET | Freq: Every day | ORAL | Status: DC

## 2013-11-21 MED ORDER — LEVOFLOXACIN 500 MG PO TABS
500.0000 mg | ORAL_TABLET | Freq: Every day | ORAL | Status: DC
  Filled 2013-11-21: qty 1

## 2013-11-21 MED ORDER — LEVOFLOXACIN 500 MG PO TABS: 500.0000 mg | ORAL_TABLET | Freq: Every day | ORAL | Status: DC

## 2013-11-21 MED ORDER — ALBUTEROL SULFATE (2.5 MG/3ML) 0.083% IN NEBU
2.5000 mg | INHALATION_SOLUTION | Freq: Three times a day (TID) | RESPIRATORY_TRACT | Status: DC
  Administered 2013-11-21: 2.5 mg via RESPIRATORY_TRACT
  Filled 2013-11-21 (×2): qty 3

## 2013-11-21 MED ORDER — ALBUTEROL SULFATE (5 MG/ML) 0.5% IN NEBU: 2.5000 mg | INHALATION_SOLUTION | Freq: Four times a day (QID) | RESPIRATORY_TRACT | Status: AC | PRN

## 2013-11-21 NOTE — Discharge Summary (Signed)
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     ALIGN 4 MG Caps  Take 1 capsule by mouth every morning.     ALPRAZolam 0.25 MG tablet  Commonly known as:  XANAX  Take 0.25 mg by mouth 3 (three) times daily as needed for anxiety.     amLODipine 2.5 MG tablet  Commonly known as:  NORVASC  Take 2.5 mg by mouth at bedtime.     atorvastatin 40 MG tablet  Commonly known as:  LIPITOR  Take 40 mg by mouth every evening.     calcium carbonate 600 MG Tabs tablet  Commonly known as:  OS-CAL  Take 600 mg by mouth every evening.     DULERA 200-5 MCG/ACT [place]  Generic drug:  mometasone-formoterol  Inhale 2 puffs into the lungs 2 (two) times daily.     DULoxetine 60 MG capsule  Commonly known as:  CYMBALTA  Take 60 mg by mouth every morning.     fentaNYL 75 MCG/HR  Commonly known as:  DURAGESIC - dosed mcg/hr  Place  75 mcg onto the skin every other day.     ferrous sulfate 325 (65 FE) MG tablet  Take 325 mg by mouth 3 (three) times daily with meals.     FORTEO [place]  Inject 20 mcg into the skin every morning.     gabapentin 300 MG capsule  Commonly known as:  NEURONTIN  Take 1 capsule (300 mg total) by mouth 2 (two) times daily.     levofloxacin 500 MG tablet  Commonly known as:  LEVAQUIN  Take 1 tablet (500 mg total) by mouth daily.     nadolol 20 MG tablet  Commonly known as:  CORGARD  Take 20 mg by mouth every evening.     polyethylene glycol packet  Commonly known as:  MIRALAX / GLYCOLAX  Take 17 g by mouth daily as needed for mild constipation.     potassium chloride 10 MEQ CR tablet  Commonly known as:  KLOR-CON  Take 10 mEq by mouth every morning.     predniSONE 20 MG tablet  Commonly known as:  DELTASONE  Take 2 tablets (40 mg total) by mouth daily with breakfast.     promethazine 25 MG tablet  Commonly known as:  PHENERGAN  Take 25 mg by mouth every 6 (six) hours as needed for nausea. Nausea     ranitidine 300 MG capsule  Commonly known as:  ZANTAC  Take 300 mg by  mouth every evening.     Rivaroxaban 20 MG Tabs tablet  Commonly known as:  XARELTO  Take 20 mg by mouth every evening.     tolterodine 2 MG tablet  Commonly known as:  DETROL  Take 2 mg by mouth every morning.     traMADol 50 MG tablet  Commonly known as:  ULTRAM  Take 100 mg by mouth every 8 (eight) hours as needed for pain.     Vitamin D3 50000 UNITS Caps  Take 100,000 Units by mouth every Sunday.        Hospital Procedures: Dg Chest 2 View  11/20/2013   CLINICAL DATA:  Cough.  Pneumonia.  EXAM: CHEST  2 VIEW  COMPARISON:  PA and lateral chest 11/17/2013. Single view of the chest 11/15/2013. CT chest 12/08/2012.  FINDINGS: The lungs are emphysematous. There is right worse than left basilar airspace disease which has progressed since the prior study. No pneumothorax is identified. Trace right pleural  effusion is noted. Heart size is normal.  IMPRESSION: Worsened right greater than left basilar airspace disease and small right effusion could be due to pneumonia or asymmetric edema.  Emphysema.   Electronically Signed   By: Inge Rise M.D.   On: 11/20/2013 09:28   Dg Chest 2 View  11/17/2013   CLINICAL DATA:  Cough.  Shortness of breath  EXAM: CHEST  2 VIEW  COMPARISON:  DG CHEST 1V PORT dated 11/15/2013; DG CHEST 2 VIEW dated 12/31/2012  FINDINGS: Mediastinum Edit hilar structures are normal. Poor inspiration with basilar atelectasis. Mild infiltrate medial right lung base cannot be excluded. No pleural effusion or pneumothorax. Heart size normal. Degenerative changes thoracic spine with the multiple compression fractures, these appear stable.  IMPRESSION: 1. Poor inspiration with mild basilar atelectasis, infiltrate and medial right lung base cannot be excluded. 2. Diffuse thoracic spine osteopenia, DJD, multiple compression fractures.   Electronically Signed   By: Marcello Moores  Register   On: 11/17/2013 10:15   Ct Head Wo Contrast  11/15/2013   CLINICAL DATA:  Headaches  EXAM: CT HEAD WITHOUT CONTRAST  TECHNIQUE: Contiguous axial images were obtained from the base of the skull through the vertex without intravenous contrast.  COMPARISON:  12/27/2012  FINDINGS: Bony calvarium is intact. Mild atrophic changes are again identified. Scattered areas of decreased attenuation identified consistent with chronic white matter ischemic change. Basal ganglia lacunar infarcts are again identified bilaterally. No findings to suggest acute hemorrhage, acute infarction or space-occupying mass lesion are noted.  IMPRESSION: Chronic changes without acute abnormality.   Electronically Signed   By: Inez Catalina M.D.   On: 11/15/2013 08:43   Dg Chest Port 1 View  11/15/2013   CLINICAL DATA:  Shortness of breath  EXAM: PORTABLE CHEST - 1 VIEW  COMPARISON:  12/31/2012  FINDINGS: Cardiac shadow is within normal limits. Diffuse  interstitial changes are noted throughout both lungs. No focal infiltrate is seen. No acute bony abnormality is noted.  IMPRESSION: Chronic changes without acute abnormality.   Electronically Signed   By: Inez Catalina M.D.   On: 11/15/2013 08:24    History of Present Illness: Dyspnea  Hospital Course: This is a 73 year old white female with severe COPD who presented with dyspnea confusion and weakness. She was found to be in acute respiratory failure. Initially this appeared to be appear COPD situation but later probable bacterial pneumonia was also diagnosed. She had leukocytosis and fever and was treated with IV Levaquin. She previously had poor GI tolerance  oral ciprofloxacin but has done well with the Levaquin. IV steroids were required. We tapered them down she worsened and she was placed back on them for couple days. [name] now back on a more vigorous oral taper of the steroids. Pulmonary saw her and adjust her medications. Their help is appreciated. [name] had some productive cough but this is improving. Her GI status as always tenuous and she had both some nausea, constipation and diarrhea prior to coming in. Her by mouth intake now is improved. Her oxygen saturations drop easily. It is clear that she'll need home oxygen for at least the foreseeable future. Her chronic back pain is doing fair. 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 She is chronically on gabapentin for this. Her mental status is improved. [name] back to baseline. She still quite weak. She has some dysuria but urine is negative.    physical therapy has not been able to work with her much. We'll try to get this going again midweek. I am going to send out home health nursing to make sure that we do well with the transition period she is going to see pulmonary as an outpatient as requested to see Dr. Halford Chessman. She resume all of her other medications pretty much unchanged. She is on Forteo for recent compression fractures  and has over a year left in this therapy treatment program. Husband is in the room and the case and plans were discussed with him.   Day of Discharge Exam BP 176/91  Pulse 65  Temp(Src) 97.6 F (36.4 C) (Oral)  Resp 19  Ht 5\' 3"  (1.6 m)  Wt 55.203 kg (121 lb 11.2 oz)  BMI 21.56 kg/m2  SpO2 98%  Physical Exam: General appearance: Frail and pale but less toxic Eyes: no scleral icterus, no nystagmus is present  Throat: oropharynx moist without erythema Resp: marked kyphosis with diminished breath sounds throughout few were right-sided rhonchi and basically no wheezing today  Cardio: regular with rare skips  GI: soft, non-tender; bowel sounds normal; no masses,  no organomegaly Extremities: thin with slightly reduced pulses and no edema  Skin is warm and dry with bruising  Neuro: She is alert awake mentating well with clear fluent speech. She seems in good spirits. No tremor is present this morning. SpO2  O2 Device  Weight Who  11/21/13 0745 -- -- -- -- -- -- -- Nasal cannula -- TB 11/21/13 0629 -- 65 -- -- ! 176/91 mmHg -- -- -- -- AT 11/21/13 0406 97.6 F (36.4 C) 65 -- 19 ! 175/90 mmHg Lying 98 % Nasal cannula -- AR 11/20/13 2213 97.8 F (36.6 C) ! 58 -- 20 ! 159/77 mmHg Lying 96 % Nasal cannula -- AM 11/20/13 1942 -- -- -- -- -- -- -- Nasal cannula -- TK 11/20/13 1420 -- -- -- -- -- -- -- Nasal cannula -- LC 11/20/13 1404 98.1 F (36.7 C) 64 -- 18 132/64 mmHg Lying 96 % Nasal cannula -- KS 11/20/13 0900 -- 90 -- -- -- -- ! 87 % Nasal cannula -- HJ 11/20/13 0742 -- -- -- -- -- -- 95 % Nasal cannula -- LC 11/20/13 0542 -- -- -- -- ! 170/69 mmHg -- -- -- -- LH 11/20/13 0504 97.3 F (36.3 C) 80 -- 20 -- -- 95 % Nasal cannula -- LH   Discharge Labs:  Recent Labs  11/20/13 0435 11/21/13 0550  NA 143 141  K 4.6 4.3  CL 107 106  CO2 27 26  GLUCOSE 94 107*  BUN 25* 27*  CREATININE 0.91 0.89  CALCIUM 8.5 8.3*    Recent Labs  11/19/13 0400  AST 18  ALT 12  ALKPHOS 71   BILITOT 0.3  PROT 5.5*  ALBUMIN 2.2*    Recent Labs  11/19/13 0400 11/21/13 0550  WBC 9.6 9.7  HGB 13.0 14.5  HCT 41.1 45.1  MCV 102.8* 100.9*  PLT 176 189   Lab Results  Component Value Date   INR 1.70* 11/15/2013   INR 1.69* 01/28/2012   INR 1.00 09/28/2011   Results for Katherine, Mckay (MRN [id number]) as of 11/21/2013 10:45  Ref. Range 11/15/2013 07:58  Sample type No range found ARTERIAL DRAW  Delivery systems No range found NASAL CANNULA  O2 Content No range found 4.0  pH, Arterial Latest Range: 7.350-7.450  7.381  pCO2 arterial Latest Range: 35.0-45.0 mmHg 53.7 (H)  pO2, Arterial Latest Range: 80.0-100.0 mmHg 74.5 (L)  Bicarbonate Latest Range: 20.0-24.0 mEq/L 31.2 (H)  TCO2 Latest Range: 0-100 mmol/L 26.9  Acid-Base Excess Latest Range: 0.0-2.0 mmol/L 4.9 (H)  O2 Saturation No range found 94.3  Patient temperature No range found 98.6  Collection site No range found BRACHIAL ARTERY   Results for Katherine, Mckay (MRN [id number]) as of 11/21/2013 10:46  Ref. Range 11/20/2013 11:22  Color, Urine Latest Range: YELLOW  YELLOW  APPearance Latest Range: CLEAR  CLEAR  Specific Gravity, Urine Latest Range: 1.005-1.030  1.014  pH Latest Range: 5.0-8.0  5.5  Glucose Latest Range: NEGATIVE mg/dL NEGATIVE  Bilirubin Urine Latest Range: NEGATIVE  NEGATIVE  Ketones, ur Latest Range: NEGATIVE mg/dL NEGATIVE  Protein Latest Range: NEGATIVE mg/dL NEGATIVE  Urobilinogen, UA Latest Range: 0.0-1.0 mg/dL 0.2  Nitrite Latest Range: NEGATIVE  NEGATIVE  Leukocytes, UA Latest Range: NEGATIVE  NEGATIVE  Hgb urine dipstick Latest Range: NEGATIVE  MODERATE (A)  WBC, UA Latest Range: <3 WBC/hpf 0-2  RBC / HPF Latest Range: <3 RBC/hpf 3-6  Squamous Epithelial / LPF Latest Range: RARE  RARE     Discharge instructions:      Future Appointments Provider Department Dept Phone   02/16/2014 1:30 PM Harriet Masson, [place] at [place]      Disposition: To  home  Follow-up Appts: Follow-up with Dr. Forde Dandy at [facility] in 1 week.  Call for appointment.  Condition on Discharge: Improved  Tests Needing Follow-up: None  Signed: [place] 11/21/2013, 10:35 AM

## 2013-11-21 NOTE — Discharge Instructions (Signed)
Use home O2 at 3 or 4 liters as needed for shortness of breath Take levaquin one daily for 5 more days Take predisone at detailed on the Rx

## 2013-11-21 NOTE — Progress Notes (Signed)
Patient's BP has been elevated this AM. Was 175/90, but at the time. Patient was complaining of 8/10 pain. Administered pain medication to the patient and pain came down to a 4/10, but patient's BP remained high at 176/91. Notified patient's MD, Dr. Forde Dandy. No new orders at this time, stated he would address during rounding. Will continue to monitor patient.

## 2013-11-21 NOTE — Progress Notes (Signed)
11/21/2013 1330 Notified [facility] of dc home with HH. [facility] delivered oxygen to room. Jonnie Finner RN CCM Case Mgmt phone [phone]

## 2013-11-21 NOTE — Progress Notes (Signed)
SATURATION QUALIFICATIONS: (This note is used to comply with regulatory documentation for home oxygen)  Patient Saturations on Room Air at Rest = 73%  Patient Saturations on Room Air while Ambulating   Patient Saturations on  Liters of oxygen while Ambulating   Please briefly explain why patient needs home oxygen: Pt desats at rest without oxygen.  Pt has hx of COPD.

## 2013-11-25 ENCOUNTER — Inpatient Hospital Stay: Payer: Medicare Other | Admitting: Adult Health

## 2013-12-03 ENCOUNTER — Encounter (HOSPITAL_COMMUNITY): Payer: Self-pay | Admitting: Emergency Medicine

## 2013-12-03 ENCOUNTER — Inpatient Hospital Stay (HOSPITAL_COMMUNITY): Payer: Medicare Other

## 2013-12-03 ENCOUNTER — Emergency Department (HOSPITAL_COMMUNITY): Payer: Medicare Other

## 2013-12-03 ENCOUNTER — Inpatient Hospital Stay (HOSPITAL_COMMUNITY)
Admission: EM | Admit: 2013-12-03 | Discharge: 2014-01-11 | DRG: 190 | Disposition: E | Payer: Medicare Other | Attending: Endocrinology | Admitting: Endocrinology

## 2013-12-03 DIAGNOSIS — I2699 Other pulmonary embolism without acute cor pulmonale: Secondary | ICD-10-CM | POA: Diagnosis present

## 2013-12-03 DIAGNOSIS — E785 Hyperlipidemia, unspecified: Secondary | ICD-10-CM | POA: Diagnosis present

## 2013-12-03 DIAGNOSIS — M549 Dorsalgia, unspecified: Secondary | ICD-10-CM | POA: Diagnosis present

## 2013-12-03 DIAGNOSIS — Z87891 Personal history of nicotine dependence: Secondary | ICD-10-CM

## 2013-12-03 DIAGNOSIS — F3289 Other specified depressive episodes: Secondary | ICD-10-CM | POA: Diagnosis present

## 2013-12-03 DIAGNOSIS — R0602 Shortness of breath: Secondary | ICD-10-CM

## 2013-12-03 DIAGNOSIS — R0600 Dyspnea, unspecified: Secondary | ICD-10-CM

## 2013-12-03 DIAGNOSIS — R7309 Other abnormal glucose: Secondary | ICD-10-CM | POA: Diagnosis present

## 2013-12-03 DIAGNOSIS — E2749 Other adrenocortical insufficiency: Secondary | ICD-10-CM | POA: Diagnosis present

## 2013-12-03 DIAGNOSIS — N189 Chronic kidney disease, unspecified: Secondary | ICD-10-CM | POA: Diagnosis present

## 2013-12-03 DIAGNOSIS — R0609 Other forms of dyspnea: Secondary | ICD-10-CM

## 2013-12-03 DIAGNOSIS — K5090 Crohn's disease, unspecified, without complications: Secondary | ICD-10-CM | POA: Diagnosis present

## 2013-12-03 DIAGNOSIS — Z8612 Personal history of poliomyelitis: Secondary | ICD-10-CM

## 2013-12-03 DIAGNOSIS — Z881 Allergy status to other antibiotic agents status: Secondary | ICD-10-CM

## 2013-12-03 DIAGNOSIS — K5900 Constipation, unspecified: Secondary | ICD-10-CM | POA: Diagnosis present

## 2013-12-03 DIAGNOSIS — Z888 Allergy status to other drugs, medicaments and biological substances status: Secondary | ICD-10-CM

## 2013-12-03 DIAGNOSIS — Z66 Do not resuscitate: Secondary | ICD-10-CM | POA: Diagnosis not present

## 2013-12-03 DIAGNOSIS — F411 Generalized anxiety disorder: Secondary | ICD-10-CM | POA: Diagnosis present

## 2013-12-03 DIAGNOSIS — J471 Bronchiectasis with (acute) exacerbation: Secondary | ICD-10-CM

## 2013-12-03 DIAGNOSIS — M810 Age-related osteoporosis without current pathological fracture: Secondary | ICD-10-CM | POA: Diagnosis present

## 2013-12-03 DIAGNOSIS — G894 Chronic pain syndrome: Secondary | ICD-10-CM | POA: Diagnosis present

## 2013-12-03 DIAGNOSIS — E739 Lactose intolerance, unspecified: Secondary | ICD-10-CM | POA: Diagnosis present

## 2013-12-03 DIAGNOSIS — T380X5A Adverse effect of glucocorticoids and synthetic analogues, initial encounter: Secondary | ICD-10-CM | POA: Diagnosis present

## 2013-12-03 DIAGNOSIS — J479 Bronchiectasis, uncomplicated: Secondary | ICD-10-CM | POA: Diagnosis present

## 2013-12-03 DIAGNOSIS — Z515 Encounter for palliative care: Secondary | ICD-10-CM

## 2013-12-03 DIAGNOSIS — J439 Emphysema, unspecified: Secondary | ICD-10-CM | POA: Diagnosis present

## 2013-12-03 DIAGNOSIS — F329 Major depressive disorder, single episode, unspecified: Secondary | ICD-10-CM | POA: Diagnosis present

## 2013-12-03 DIAGNOSIS — J9620 Acute and chronic respiratory failure, unspecified whether with hypoxia or hypercapnia: Secondary | ICD-10-CM | POA: Diagnosis present

## 2013-12-03 DIAGNOSIS — H3530 Unspecified macular degeneration: Secondary | ICD-10-CM | POA: Diagnosis present

## 2013-12-03 DIAGNOSIS — R5383 Other fatigue: Secondary | ICD-10-CM

## 2013-12-03 DIAGNOSIS — Z91041 Radiographic dye allergy status: Secondary | ICD-10-CM

## 2013-12-03 DIAGNOSIS — K219 Gastro-esophageal reflux disease without esophagitis: Secondary | ICD-10-CM | POA: Diagnosis present

## 2013-12-03 DIAGNOSIS — J9690 Respiratory failure, unspecified, unspecified whether with hypoxia or hypercapnia: Secondary | ICD-10-CM

## 2013-12-03 DIAGNOSIS — M129 Arthropathy, unspecified: Secondary | ICD-10-CM | POA: Diagnosis present

## 2013-12-03 DIAGNOSIS — E7800 Pure hypercholesterolemia, unspecified: Secondary | ICD-10-CM | POA: Diagnosis present

## 2013-12-03 DIAGNOSIS — H9190 Unspecified hearing loss, unspecified ear: Secondary | ICD-10-CM | POA: Diagnosis present

## 2013-12-03 DIAGNOSIS — M8080XA Other osteoporosis with current pathological fracture, unspecified site, initial encounter for fracture: Secondary | ICD-10-CM | POA: Diagnosis present

## 2013-12-03 DIAGNOSIS — Z885 Allergy status to narcotic agent status: Secondary | ICD-10-CM

## 2013-12-03 DIAGNOSIS — E43 Unspecified severe protein-calorie malnutrition: Secondary | ICD-10-CM | POA: Diagnosis present

## 2013-12-03 DIAGNOSIS — J189 Pneumonia, unspecified organism: Secondary | ICD-10-CM | POA: Diagnosis present

## 2013-12-03 DIAGNOSIS — Z880 Allergy status to penicillin: Secondary | ICD-10-CM

## 2013-12-03 DIAGNOSIS — R0989 Other specified symptoms and signs involving the circulatory and respiratory systems: Secondary | ICD-10-CM

## 2013-12-03 DIAGNOSIS — M4000 Postural kyphosis, site unspecified: Secondary | ICD-10-CM | POA: Diagnosis present

## 2013-12-03 DIAGNOSIS — IMO0002 Reserved for concepts with insufficient information to code with codable children: Secondary | ICD-10-CM

## 2013-12-03 DIAGNOSIS — E875 Hyperkalemia: Secondary | ICD-10-CM | POA: Diagnosis present

## 2013-12-03 DIAGNOSIS — J151 Pneumonia due to Pseudomonas: Secondary | ICD-10-CM | POA: Diagnosis present

## 2013-12-03 DIAGNOSIS — R5381 Other malaise: Secondary | ICD-10-CM | POA: Diagnosis present

## 2013-12-03 DIAGNOSIS — Z86718 Personal history of other venous thrombosis and embolism: Secondary | ICD-10-CM

## 2013-12-03 DIAGNOSIS — R269 Unspecified abnormalities of gait and mobility: Secondary | ICD-10-CM | POA: Diagnosis present

## 2013-12-03 DIAGNOSIS — I2782 Chronic pulmonary embolism: Secondary | ICD-10-CM | POA: Diagnosis present

## 2013-12-03 DIAGNOSIS — J441 Chronic obstructive pulmonary disease with (acute) exacerbation: Principal | ICD-10-CM | POA: Diagnosis present

## 2013-12-03 DIAGNOSIS — Z79899 Other long term (current) drug therapy: Secondary | ICD-10-CM

## 2013-12-03 DIAGNOSIS — J9600 Acute respiratory failure, unspecified whether with hypoxia or hypercapnia: Secondary | ICD-10-CM

## 2013-12-03 DIAGNOSIS — M8440XA Pathological fracture, unspecified site, initial encounter for fracture: Secondary | ICD-10-CM | POA: Diagnosis present

## 2013-12-03 DIAGNOSIS — E8779 Other fluid overload: Secondary | ICD-10-CM | POA: Diagnosis present

## 2013-12-03 DIAGNOSIS — D510 Vitamin B12 deficiency anemia due to intrinsic factor deficiency: Secondary | ICD-10-CM | POA: Diagnosis present

## 2013-12-03 DIAGNOSIS — I129 Hypertensive chronic kidney disease with stage 1 through stage 4 chronic kidney disease, or unspecified chronic kidney disease: Secondary | ICD-10-CM | POA: Diagnosis present

## 2013-12-03 DIAGNOSIS — R531 Weakness: Secondary | ICD-10-CM

## 2013-12-03 DIAGNOSIS — G609 Hereditary and idiopathic neuropathy, unspecified: Secondary | ICD-10-CM | POA: Diagnosis present

## 2013-12-03 LAB — COMPREHENSIVE METABOLIC PANEL
ALK PHOS: 172 U/L — AB (ref 39–117)
ALT: 47 U/L — ABNORMAL HIGH (ref 0–35)
AST: 29 U/L (ref 0–37)
Albumin: 2.8 g/dL — ABNORMAL LOW (ref 3.5–5.2)
BUN: 17 mg/dL (ref 6–23)
CALCIUM: 9.9 mg/dL (ref 8.4–10.5)
CO2: 35 meq/L — AB (ref 19–32)
Chloride: 96 mEq/L (ref 96–112)
Creatinine, Ser: 0.88 mg/dL (ref 0.50–1.10)
GFR calc Af Amer: 75 mL/min — ABNORMAL LOW (ref >90)
GFR calc non Af Amer: 65 mL/min — ABNORMAL LOW (ref >90)
Glucose, Bld: 150 mg/dL — ABNORMAL HIGH (ref 70–99)
POTASSIUM: 4.7 meq/L (ref 3.7–5.3)
SODIUM: 142 meq/L (ref 137–147)
Total Bilirubin: 0.7 mg/dL (ref 0.3–1.2)
Total Protein: 6.9 g/dL (ref 6.0–8.3)

## 2013-12-03 LAB — CBC WITH DIFFERENTIAL/PLATELET
BASOS ABS: 0 10*3/uL (ref 0.0–0.1)
Basophils Relative: 0 % (ref 0–1)
EOS PCT: 0 % (ref 0–5)
Eosinophils Absolute: 0 10*3/uL (ref 0.0–0.7)
HCT: 47.4 % — ABNORMAL HIGH (ref 36.0–46.0)
Hemoglobin: 15 g/dL (ref 12.0–15.0)
LYMPHS ABS: 1 10*3/uL (ref 0.7–4.0)
Lymphocytes Relative: 6 % — ABNORMAL LOW (ref 12–46)
MCH: 32.3 pg (ref 26.0–34.0)
MCHC: 31.6 g/dL (ref 30.0–36.0)
MCV: 101.9 fL — ABNORMAL HIGH (ref 78.0–100.0)
Monocytes Absolute: 1.3 10*3/uL — ABNORMAL HIGH (ref 0.1–1.0)
Monocytes Relative: 7 % (ref 3–12)
NEUTROS ABS: 15.9 10*3/uL — AB (ref 1.7–7.7)
Neutrophils Relative %: 87 % — ABNORMAL HIGH (ref 43–77)
PLATELETS: 366 10*3/uL (ref 150–400)
RBC: 4.65 MIL/uL (ref 3.87–5.11)
RDW: 14 % (ref 11.5–15.5)
WBC: 18.2 10*3/uL — AB (ref 4.0–10.5)

## 2013-12-03 LAB — URINE MICROSCOPIC-ADD ON

## 2013-12-03 LAB — URINALYSIS, ROUTINE W REFLEX MICROSCOPIC
Bilirubin Urine: NEGATIVE
Glucose, UA: NEGATIVE mg/dL
Ketones, ur: NEGATIVE mg/dL
Leukocytes, UA: NEGATIVE
NITRITE: NEGATIVE
PH: 6 (ref 5.0–8.0)
Protein, ur: 30 mg/dL — AB
Specific Gravity, Urine: 1.022 (ref 1.005–1.030)
UROBILINOGEN UA: 0.2 mg/dL (ref 0.0–1.0)

## 2013-12-03 LAB — BLOOD GAS, ARTERIAL
Acid-Base Excess: 8.3 mmol/L — ABNORMAL HIGH (ref 0.0–2.0)
Bicarbonate: 35.1 mEq/L — ABNORMAL HIGH (ref 20.0–24.0)
DRAWN BY: 257701
O2 Content: 5 L/min
O2 SAT: 87.7 %
PATIENT TEMPERATURE: 98.6
TCO2: 29.6 mmol/L (ref 0–100)
pCO2 arterial: 56.6 mmHg — ABNORMAL HIGH (ref 35.0–45.0)
pH, Arterial: 7.409 (ref 7.350–7.450)
pO2, Arterial: 53.3 mmHg — ABNORMAL LOW (ref 80.0–100.0)

## 2013-12-03 LAB — GLUCOSE, CAPILLARY: Glucose-Capillary: 148 mg/dL — ABNORMAL HIGH (ref 70–99)

## 2013-12-03 LAB — TROPONIN I: Troponin I: <0.3 ng/mL (ref <0.30)

## 2013-12-03 LAB — PROTIME-INR
INR: 1.07 (ref 0.00–1.49)
Prothrombin Time: 13.7 seconds (ref 11.6–15.2)

## 2013-12-03 LAB — MRSA PCR SCREENING: MRSA BY PCR: NEGATIVE

## 2013-12-03 LAB — PRO B NATRIURETIC PEPTIDE: PRO B NATRI PEPTIDE: 2859 pg/mL — AB (ref 0–125)

## 2013-12-03 MED ORDER — DEXTROSE 5 % IV SOLN: 500.0000 mg | Freq: Once | INTRAVENOUS | Status: DC

## 2013-12-03 MED ORDER — VANCOMYCIN HCL 500 MG IV SOLR
500.0000 mg | Freq: Two times a day (BID) | INTRAVENOUS | Status: DC
  Administered 2013-12-04 (×2): 500 mg via INTRAVENOUS
  Filled 2013-12-03 (×3): qty 500

## 2013-12-03 MED ORDER — IPRATROPIUM-ALBUTEROL 0.5-2.5 (3) MG/3ML IN SOLN
3.0000 mL | RESPIRATORY_TRACT | Status: DC
  Administered 2013-12-03: 3 mL via RESPIRATORY_TRACT
  Filled 2013-12-03: qty 3

## 2013-12-03 MED ORDER — INSULIN ASPART 100 UNIT/ML ~~LOC~~ SOLN
0.0000 [IU] | Freq: Every day | SUBCUTANEOUS | Status: DC
  Administered 2013-12-06: 2 [IU] via SUBCUTANEOUS

## 2013-12-03 MED ORDER — ALPRAZOLAM 0.25 MG PO TABS
0.2500 mg | ORAL_TABLET | Freq: Three times a day (TID) | ORAL | Status: DC | PRN
  Administered 2013-12-03 – 2013-12-12 (×15): 0.25 mg via ORAL
  Filled 2013-12-03 (×15): qty 1

## 2013-12-03 MED ORDER — VANCOMYCIN HCL IN DEXTROSE 750-5 MG/150ML-% IV SOLN
750.0000 mg | Freq: Once | INTRAVENOUS | Status: AC
  Administered 2013-12-03: 750 mg via INTRAVENOUS
  Filled 2013-12-03: qty 150

## 2013-12-03 MED ORDER — OXYBUTYNIN CHLORIDE ER 5 MG PO TB24
5.0000 mg | ORAL_TABLET | Freq: Every day | ORAL | Status: DC
  Administered 2013-12-04 – 2013-12-11 (×8): 5 mg via ORAL
  Filled 2013-12-03 (×11): qty 1

## 2013-12-03 MED ORDER — DEXTROSE 5 % IV SOLN
2.0000 g | Freq: Once | INTRAVENOUS | Status: AC
  Administered 2013-12-03: 2 g via INTRAVENOUS

## 2013-12-03 MED ORDER — CALCIUM CARBONATE 1250 (500 CA) MG PO TABS
1.0000 | ORAL_TABLET | Freq: Every day | ORAL | Status: DC
Start: 2013-12-03 — End: 2013-12-13
  Administered 2013-12-03 – 2013-12-11 (×9): 500 mg via ORAL
  Filled 2013-12-03 (×12): qty 1

## 2013-12-03 MED ORDER — NADOLOL 20 MG PO TABS
20.0000 mg | ORAL_TABLET | Freq: Every evening | ORAL | Status: DC
  Administered 2013-12-03 – 2013-12-11 (×9): 20 mg via ORAL
  Filled 2013-12-03 (×12): qty 1

## 2013-12-03 MED ORDER — ONDANSETRON HCL 4 MG/2ML IJ SOLN: 4.0000 mg | Freq: Three times a day (TID) | INTRAMUSCULAR | Status: AC | PRN

## 2013-12-03 MED ORDER — OXYCODONE-ACETAMINOPHEN 5-325 MG PO TABS
0.5000 | ORAL_TABLET | ORAL | Status: DC | PRN
  Administered 2013-12-03 – 2013-12-04 (×2): 1 via ORAL
  Filled 2013-12-03 (×2): qty 1

## 2013-12-03 MED ORDER — FAMOTIDINE 20 MG PO TABS
20.0000 mg | ORAL_TABLET | Freq: Two times a day (BID) | ORAL | Status: DC
  Administered 2013-12-03 – 2013-12-12 (×18): 20 mg via ORAL
  Filled 2013-12-03 (×21): qty 1

## 2013-12-03 MED ORDER — FENTANYL 75 MCG/HR TD PT72
75.0000 ug | MEDICATED_PATCH | TRANSDERMAL | Status: DC
  Administered 2013-12-04 – 2013-12-12 (×5): 75 ug via TRANSDERMAL
  Filled 2013-12-03 (×5): qty 1

## 2013-12-03 MED ORDER — GABAPENTIN 300 MG PO CAPS
300.0000 mg | ORAL_CAPSULE | Freq: Every day | ORAL | Status: DC
  Administered 2013-12-03 – 2013-12-12 (×10): 300 mg via ORAL
  Filled 2013-12-03 (×11): qty 1

## 2013-12-03 MED ORDER — AZTREONAM 1 G IJ SOLR
1.0000 g | Freq: Three times a day (TID) | INTRAMUSCULAR | Status: DC
  Administered 2013-12-04 – 2013-12-06 (×8): 1 g via INTRAVENOUS
  Filled 2013-12-03 (×10): qty 1

## 2013-12-03 MED ORDER — VANCOMYCIN HCL IN DEXTROSE 1-5 GM/200ML-% IV SOLN
1000.0000 mg | Freq: Once | INTRAVENOUS | Status: DC
  Filled 2013-12-03: qty 200

## 2013-12-03 MED ORDER — PROMETHAZINE HCL 25 MG PO TABS
25.0000 mg | ORAL_TABLET | Freq: Four times a day (QID) | ORAL | Status: DC | PRN
  Administered 2013-12-09: 25 mg via ORAL
  Filled 2013-12-03: qty 1

## 2013-12-03 MED ORDER — ENOXAPARIN SODIUM 40 MG/0.4ML ~~LOC~~ SOLN: 40.0000 mg | SUBCUTANEOUS | Status: DC

## 2013-12-03 MED ORDER — CALCIUM CARBONATE 600 MG PO TABS
600.0000 mg | ORAL_TABLET | Freq: Every evening | ORAL | Status: DC
  Filled 2013-12-03: qty 1

## 2013-12-03 MED ORDER — METHYLPREDNISOLONE SODIUM SUCC 40 MG IJ SOLR
40.0000 mg | Freq: Four times a day (QID) | INTRAMUSCULAR | Status: DC
  Administered 2013-12-03 – 2013-12-05 (×6): 40 mg via INTRAVENOUS
  Filled 2013-12-03 (×11): qty 1

## 2013-12-03 MED ORDER — TRAMADOL HCL 50 MG PO TABS
100.0000 mg | ORAL_TABLET | Freq: Three times a day (TID) | ORAL | Status: DC | PRN
  Administered 2013-12-04 – 2013-12-09 (×2): 100 mg via ORAL
  Filled 2013-12-03 (×2): qty 2

## 2013-12-03 MED ORDER — AMLODIPINE BESYLATE 2.5 MG PO TABS
2.5000 mg | ORAL_TABLET | Freq: Every day | ORAL | Status: DC
  Administered 2013-12-03 – 2013-12-04 (×2): 2.5 mg via ORAL
  Filled 2013-12-03 (×3): qty 1

## 2013-12-03 MED ORDER — DEXTROSE 5 % IV SOLN
500.0000 mg | INTRAVENOUS | Status: DC
  Administered 2013-12-03 – 2013-12-05 (×3): 500 mg via INTRAVENOUS
  Filled 2013-12-03 (×3): qty 500

## 2013-12-03 MED ORDER — DULOXETINE HCL 60 MG PO CPEP
60.0000 mg | ORAL_CAPSULE | Freq: Every morning | ORAL | Status: DC
  Administered 2013-12-04 – 2013-12-11 (×8): 60 mg via ORAL
  Filled 2013-12-03 (×10): qty 1

## 2013-12-03 MED ORDER — FERROUS SULFATE 325 (65 FE) MG PO TABS
325.0000 mg | ORAL_TABLET | Freq: Three times a day (TID) | ORAL | Status: DC
  Administered 2013-12-04 – 2013-12-11 (×18): 325 mg via ORAL
  Filled 2013-12-03 (×31): qty 1

## 2013-12-03 MED ORDER — POLYETHYLENE GLYCOL 3350 17 G PO PACK
17.0000 g | PACK | Freq: Every day | ORAL | Status: DC | PRN
  Filled 2013-12-03: qty 1

## 2013-12-03 MED ORDER — ALBUTEROL SULFATE (2.5 MG/3ML) 0.083% IN NEBU: 5.0000 mg | INHALATION_SOLUTION | RESPIRATORY_TRACT | Status: AC | PRN

## 2013-12-03 MED ORDER — INSULIN ASPART 100 UNIT/ML ~~LOC~~ SOLN
0.0000 [IU] | Freq: Three times a day (TID) | SUBCUTANEOUS | Status: DC
Start: 2013-12-04 — End: 2013-12-07
  Administered 2013-12-04: 7 [IU] via SUBCUTANEOUS
  Administered 2013-12-04: 1 [IU] via SUBCUTANEOUS
  Administered 2013-12-05: 2 [IU] via SUBCUTANEOUS
  Administered 2013-12-05: 3 [IU] via SUBCUTANEOUS
  Administered 2013-12-06: 1 [IU] via SUBCUTANEOUS

## 2013-12-03 MED ORDER — METHYLPREDNISOLONE SODIUM SUCC 125 MG IJ SOLR
60.0000 mg | Freq: Once | INTRAMUSCULAR | Status: AC
Start: 2013-12-03 — End: 2013-12-03
  Administered 2013-12-03: 125 mg via INTRAVENOUS
  Filled 2013-12-03: qty 2

## 2013-12-03 MED ORDER — ALIGN 4 MG PO CAPS
1.0000 | ORAL_CAPSULE | Freq: Every morning | ORAL | Status: DC
  Administered 2013-12-04 – 2013-12-11 (×8): 4 mg via ORAL
  Filled 2013-12-03 (×11): qty 1

## 2013-12-03 MED ORDER — ATORVASTATIN CALCIUM 40 MG PO TABS
40.0000 mg | ORAL_TABLET | Freq: Every evening | ORAL | Status: DC
  Administered 2013-12-03 – 2013-12-11 (×9): 40 mg via ORAL
  Filled 2013-12-03 (×11): qty 1

## 2013-12-03 MED ORDER — ALBUTEROL SULFATE (2.5 MG/3ML) 0.083% IN NEBU: 2.5000 mg | INHALATION_SOLUTION | RESPIRATORY_TRACT | Status: DC | PRN

## 2013-12-03 MED ORDER — RIVAROXABAN 20 MG PO TABS
20.0000 mg | ORAL_TABLET | Freq: Every evening | ORAL | Status: DC
  Administered 2013-12-03 – 2013-12-11 (×9): 20 mg via ORAL
  Filled 2013-12-03 (×11): qty 1

## 2013-12-03 MED ORDER — IPRATROPIUM-ALBUTEROL 0.5-2.5 (3) MG/3ML IN SOLN
3.0000 mL | Freq: Four times a day (QID) | RESPIRATORY_TRACT | Status: DC
  Administered 2013-12-03 – 2013-12-05 (×5): 3 mL via RESPIRATORY_TRACT
  Filled 2013-12-03 (×5): qty 3

## 2013-12-03 MED ORDER — ALBUTEROL SULFATE (5 MG/ML) 0.5% IN NEBU: 2.5000 mg | INHALATION_SOLUTION | Freq: Four times a day (QID) | RESPIRATORY_TRACT | Status: DC

## 2013-12-03 MED ORDER — OXYBUTYNIN CHLORIDE ER 5 MG PO TB24
5.0000 mg | ORAL_TABLET | Freq: Every day | ORAL | Status: DC
  Filled 2013-12-03: qty 1

## 2013-12-03 NOTE — Consult Note (Signed)
PULMONARY / CRITICAL CARE MEDICINE   Name: Katherine Mckay MRN: [id number] DOB: 02-23-1941    ADMISSION DATE:  11/23/2013  REFERRING MD :  ER  CHIEF COMPLAINT:  Short of breath  BRIEF PATIENT DESCRIPTION:  73 yo female with hypoxia, wheezing and pneumonia.  She has hx of COPD on steroids, bronchiectasis, and home oxygen.  She was recently treated for pneumonia.  SIGNIFICANT EVENTS: 4/23 Admit  STUDIES:  11/10/12 Sputum >> Pseudomonas (pan sensitive) 11/17/12 CT chest >> centrilobular emphysema, mild BTX 12/09/12 PFT >> FEV1 0.94 (51%), FEV1% 45, TLC 5.09 (115%), DLCO 41%  LINES / TUBES: PIV  CULTURES: Blood 4/23 >>   ANTIBIOTICS: Zithromax 4/23 >> Azactam 4/23 >> Vancomycin 4/23 >>   HISTORY OF PRESENT ILLNESS:   73 yo female former smoker was in hospital earlier this month with AECOPD, BTX, respiratory failure.  She was d/c on 11/21/13.  She returned to ER after home care nurse noted worsening hypoxia and wheezing.  She was noted to have increased infiltrate on CXR.  PCCM asked to assist with management.  She was doing well until 2 days prior to this admission.  She started getting more wheeze and chest congestion.  She has trouble bringing up sputum, but phlegm is yellow.  She denies hemoptysis.  She denies chest or abdominal pain.  She is not sure if she had fever.  She has not noticed leg swelling.  PAST MEDICAL HISTORY :  Past Medical History  Diagnosis Date  . Polio   . Osteoporosis     multiple compression fractures  . Crohn's disease     since 1966 on chronic steroids s/p ileal resection, fistula repair  . Brain tumor   . Migraines   . Hypercholesteremia   . Hypertension   . GERD (gastroesophageal reflux disease)   . Anxiety   . Depression   . Emphysema   . Macular degeneration   . Cataract   . Arthritis   . Pernicious anemia   . Primary adrenal deficiency   . COPD (chronic obstructive pulmonary disease)   . Neuropathy, idiopathic   . Anticoagulated on  warfarin     Hx of DVT, PE  . Deafness in right ear    Past Surgical History  Procedure Laterality Date  . Cholecystectomy    . Cataract repair    . Ileal resection    . Hemicolectomy      rt due to crohns  . Colonoscopy  08/17/2011    Procedure: COLONOSCOPY;  Surgeon: Landry Dyke, MD;  Location: [facility] ENDOSCOPY;  Service: Endoscopy;  Laterality: N/A;  . Givens capsule study  09/27/2011    Procedure: GIVENS CAPSULE STUDY;  Surgeon: Landry Dyke, MD;  Location: [facility] ENDOSCOPY;  Service: Endoscopy;  Laterality: N/A;   Prior to Admission medications   Medication Sig Start Date End Date Taking? Authorizing Provider  albuterol (PROVENTIL) (5 MG/ML) 0.5% nebulizer solution Take 0.5 mLs (2.5 mg total) by nebulization every 6 (six) hours as needed for wheezing or shortness of breath. 11/21/13  Yes [name], MD  ALPRAZolam Duanne Moron) 0.25 MG tablet Take 0.25 mg by mouth 3 (three) times daily as needed for anxiety.    Yes Historical Provider, MD  amLODipine (NORVASC) 2.5 MG tablet Take 2.5 mg by mouth at bedtime.    Yes Historical Provider, MD  atorvastatin (LIPITOR) 40 MG tablet Take 40 mg by mouth every evening.  05/12/12  Yes Historical Provider, MD  calcium carbonate (OS-CAL) 600 MG  TABS Take 600 mg by mouth every evening.    Yes Historical Provider, MD  Cholecalciferol (VITAMIN D3) 50000 UNITS CAPS Take 100,000 Units by mouth See admin instructions. Takes one capsule on Sunday and Takes one capsule on Wednesday   Yes Historical Provider, MD  DULoxetine (CYMBALTA) 60 MG capsule Take 60 mg by mouth every morning.    Yes Historical Provider, MD  fentaNYL (DURAGESIC - DOSED MCG/HR) 75 MCG/HR Place 75 mcg onto the skin every other day.    Yes Historical Provider, MD  ferrous sulfate 325 (65 FE) MG tablet Take 325 mg by mouth 3 (three) times daily with meals.   Yes Historical Provider, MD  gabapentin (NEURONTIN) 300 MG capsule Take 300 mg by mouth at bedtime.   Yes Historical Provider, MD   mometasone-formoterol (DULERA) 200-5 MCG/ACT AERO Inhale 2 puffs into the lungs 2 (two) times daily.   Yes Historical Provider, MD  nadolol (CORGARD) 20 MG tablet Take 20 mg by mouth every evening.    Yes Historical Provider, MD  oxyCODONE-acetaminophen (PERCOCET/ROXICET) 5-325 MG per tablet Take 0.5-1 tablets by mouth every 4 (four) hours as needed for severe pain.   Yes Historical Provider, MD  polyethylene glycol (MIRALAX / GLYCOLAX) packet Take 17 g by mouth daily as needed for mild constipation.   Yes Historical Provider, MD  potassium chloride (KLOR-CON) 10 MEQ CR tablet Take 10 mEq by mouth 2 (two) times daily.    Yes Historical Provider, MD  predniSONE (DELTASONE) 20 MG tablet Take 10 mg by mouth daily with breakfast.   Yes Historical Provider, MD  Probiotic Product (ALIGN) 4 MG CAPS Take 1 capsule by mouth every morning.    Yes Historical Provider, MD  promethazine (PHENERGAN) 25 MG tablet Take 25 mg by mouth every 6 (six) hours as needed for nausea. Nausea   Yes Historical Provider, MD  ranitidine (ZANTAC) 300 MG capsule Take 300 mg by mouth 2 (two) times daily.    Yes Historical Provider, MD  Rivaroxaban 20 MG TABS Take 20 mg by mouth every evening.    Yes Historical Provider, MD  Teriparatide, Recombinant, (FORTEO [place]) Inject 20 mcg into the skin every morning.    Yes Historical Provider, MD  tolterodine (DETROL) 2 MG tablet Take 2 mg by mouth every morning.    Yes Historical Provider, MD  traMADol (ULTRAM) 50 MG tablet Take 100 mg by mouth every 8 (eight) hours as needed for pain.    Yes Historical Provider, MD   Allergies  Allergen Reactions  . Celebrex [Celecoxib] Shortness Of Breath    No voiding  . Penicillins Shortness Of Breath and Rash  . Smz-Tmp Ds [Sulfamethoxazole W/Trimethoprim (Co-Trimoxazole)] Rash  . Ceclor [Cefaclor] Other (See Comments)    Stomach pain  . Ciprofloxacin Hcl     Possible GI Bleed.  . Diclofenac Sodium     Pt doesn't remember reaction  .  Doxycycline     Stomach pains.  . Flagyl [Metronidazole Hcl]     Vaginal rash  . Hydrocodone Other (See Comments)    Severe stomach pain  . Iohexol      Code: HIVES, Desc: pt states she breaks out in hives and sneezes 10/01/08   Desc: STATES SHE ONLY REACTS BY SNEEZING   . Other Other (See Comments)    Darvocet - liver damage  . Percocet [Oxycodone-Acetaminophen] Other (See Comments)    Liver damage  . Prilosec [Omeprazole] Other (See Comments)    Causes ulcers in stomach and  esophagus  . Tylenol [Acetaminophen] Other (See Comments)    Liver damage    FAMILY HISTORY:  Family History  Problem Relation Age of Onset  . Anesthesia problems Neg Hx   . Hypotension Neg Hx   . Malignant hyperthermia Neg Hx   . Pseudochol deficiency Neg Hx    SOCIAL HISTORY:  reports that she quit smoking about 2 years ago. Her smoking use included Cigarettes. She has a 55 pack-year smoking history. She has never used smokeless tobacco. She reports that she does not drink alcohol or use illicit drugs.  REVIEW OF SYSTEMS:   Negative except above.  SUBJECTIVE:   VITAL SIGNS: Pulse Rate:  [87-105] 87 (04/23 1552) Resp:  [18-24] 18 (04/23 1552) BP: (151-157)/(96-102) 157/101 mmHg (04/23 1552) SpO2:  [91 %-93 %] 93 % (04/23 1552) INTAKE / OUTPUT: Intake/Output   None     PHYSICAL EXAMINATION: General: Thin, frail Neuro:  Alert, normal strength HEENT:  No sinus tenderness Cardiovascular:  Regular, no murmur Lungs:  B/l wheeze, scattered rhonchi Abdomen:  Soft, non tender Musculoskeletal:  No edema Skin:  No rashes  LABS:  CBC  Recent Labs Lab 2013/12/09 1250  WBC 18.2*  HGB 15.0  HCT 47.4*  PLT 366   Coag's  Recent Labs Lab 12-09-13 1250  INR 1.07   BMET  Recent Labs Lab 09-Dec-2013 1250  NA 142  K 4.7  CL 96  CO2 35*  BUN 17  CREATININE 0.88  GLUCOSE 150*   Electrolytes  Recent Labs Lab 2013-12-09 1250  CALCIUM 9.9   Sepsis Markers No results found for this  basename: LATICACIDVEN, PROCALCITON, O2SATVEN,  in the last 168 hours  ABG  Recent Labs Lab 12-09-2013 1452  PHART 7.409  PCO2ART 56.6*  PO2ART 53.3*   Liver Enzymes  Recent Labs Lab 2013/12/09 1250  AST 29  ALT 47*  ALKPHOS 172*  BILITOT 0.7  ALBUMIN 2.8*   Cardiac Enzymes  Recent Labs Lab Dec 09, 2013 1250  TROPONINI <0.30  PROBNP 2859.0*   Glucose No results found for this basename: GLUCAP,  in the last 168 hours  Imaging Dg Chest 2 View  12-09-13   CLINICAL DATA:  COPD, shortness of breath  EXAM: CHEST  2 VIEW  COMPARISON:  DG CHEST 2 VIEW dated 11/20/2013; DG CHEST 1V PORT dated 11/15/2013; DG CHEST 2 VIEW dated 12/31/2012  FINDINGS: There is hazy bibasilar airspace disease which is less pronounced compared with the prior exams likely rib reflecting resolving pneumonia. There is hazy right upper lobe airspace disease likely reflecting pneumonia. There is no pleural effusion or pneumothorax. Stable cardiomediastinal silhouette. There are multiple old right posterior rib deformities.  IMPRESSION: 1. Bibasilar hazy airspace disease improved compared with the prior exam. Hazy right upper lobe airspace disease more prominent compared with the prior exam. These findings are most compatible with persistent pneumonia.   Electronically Signed   By: Kathreen Devoid   On: 2013/12/09 13:39    ASSESSMENT / PLAN:  PULMONARY A: Acute on chronic respiratory failure 2nd to HCAP, AECOPD, BTX. P:   Oxygen to keep SpO2 > 92% IV solumedrol F/u CXR Scheduled BD's Bronchial hygiene Hold outpt dulera for now  CARDIOVASCULAR A:  Hx of HTN, hyperlipidemia. Hx of PE on chronic rivaroxaban as outpt P:  Per primary team  RENAL A:   No acute issues. P:   Monitor renal fx, urine outpt, electrolytes  GASTROINTESTINAL A:   Nutrition. Protein calorie malnutrition. Hx of Crohn's diseasee. P:  Per primary team  HEMATOLOGIC A:   Leukocytosis. P:  F/u CBC  INFECTIOUS A:   HCAP >>  prior hx of Pseudomonas in sputum, and on chronic prednisone.  Allergies to PCN.  P:   Agree with vancomycin, atreonam, zithromax F/u cx results  ENDOCRINE A:   Steroid induced hyperglycemia.  Hx of Osteoporosis.  P:   Per primary team  NEUROLOGIC A:   Hx of Anxiety, depression. Chronic neuropathic pain. P:   Pain medications per primary team  [name], MD [place] 12/10/2013, 4:13 PM Pager:  [phone] After 3pm call: [phone]

## 2013-12-03 NOTE — ED Notes (Signed)
RT has been called to draw the ordered ABG

## 2013-12-03 NOTE — ED Notes (Addendum)
Pt presents from home after being seen by an [facility] Nurse and was found to have a oxygen level of 80% at rest on 4L O2 with a pulse of 108. Pt has wheezing bilaterally. Pt reports this episode began three days ago. Pt denies chest pain, however has shortness of breath and has difficulty completing sentences. Home health called PCP and was instructed to present to the ED. Pt is on 4L [place] and is maintaining saturations of 88-94% in triage. Pt is A/O x4.

## 2013-12-03 NOTE — Progress Notes (Signed)
ANTIBIOTIC CONSULT NOTE - INITIAL  Pharmacy Consult for Vancomycin and Aztreonam Indication: pneumonia  Allergies  Allergen Reactions  . Celebrex [Celecoxib] Shortness Of Breath    No voiding  . Penicillins Shortness Of Breath and Rash  . Smz-Tmp Ds [Sulfamethoxazole W/Trimethoprim (Co-Trimoxazole)] Rash  . Ceclor [Cefaclor] Other (See Comments)    Stomach pain  . Ciprofloxacin Hcl     Possible GI Bleed.  . Diclofenac Sodium     Pt doesn't remember reaction  . Doxycycline     Stomach pains.  . Flagyl [Metronidazole Hcl]     Vaginal rash  . Hydrocodone Other (See Comments)    Severe stomach pain  . Iohexol      Code: HIVES, Desc: pt states she breaks out in hives and sneezes 10/01/08   Desc: STATES SHE ONLY REACTS BY SNEEZING   . Other Other (See Comments)    Darvocet - liver damage  . Percocet [Oxycodone-Acetaminophen] Other (See Comments)    Liver damage  . Prilosec [Omeprazole] Other (See Comments)    Causes ulcers in stomach and esophagus  . Tylenol [Acetaminophen] Other (See Comments)    Liver damage    Patient Measurements:   As of 11/15/13 Wt 55.2kg Ht 63in  Vital Signs: BP: 165/144 mmHg (04/23 1730) Pulse Rate: 89 (04/23 1730) Intake/Output from previous day:   Intake/Output from this shift:    Labs:  Recent Labs  11/14/2013 1250  WBC 18.2*  HGB 15.0  PLT 366  CREATININE 0.88   The CrCl is unknown because both a height and weight (above a minimum accepted value) are required for this calculation. CrCl 51 ml/min/1.67m2 (normalized)  No results found for this basename: VANCOTROUGH, VANCOPEAK, VANCORANDOM, GENTTROUGH, GENTPEAK, GENTRANDOM, TOBRATROUGH, TOBRAPEAK, TOBRARND, AMIKACINPEAK, AMIKACINTROU, AMIKACIN,  in the last 72 hours   Microbiology: No results found for this or any previous visit (from the past 720 hour(s)). 4/23 blood x2: 4/23 sputum:  Medical History: Past Medical History  Diagnosis Date  . Polio   . Osteoporosis    multiple compression fractures  . Crohn's disease     since 1966 on chronic steroids s/p ileal resection, fistula repair  . Brain tumor   . Migraines   . Hypercholesteremia   . Hypertension   . GERD (gastroesophageal reflux disease)   . Anxiety   . Depression   . Emphysema   . Macular degeneration   . Cataract   . Arthritis   . Pernicious anemia   . Primary adrenal deficiency   . COPD (chronic obstructive pulmonary disease)   . Neuropathy, idiopathic   . Anticoagulated on warfarin     Hx of DVT, PE  . Deafness in right ear     Medications:  Scheduled:  . azithromycin  500 mg Intravenous Q24H  . [START ON 12/04/2013] aztreonam  1 g Intravenous 3 times per day  . ipratropium-albuterol  3 mL Nebulization Q6H WA  . methylPREDNISolone (SOLU-MEDROL) injection  40 mg Intravenous Q6H  . [START ON 12/04/2013] vancomycin  500 mg Intravenous Q12H  . vancomycin  750 mg Intravenous Once   Infusions:  . ipratropium-albuterol     PRN: albuterol, albuterol, ondansetron (ZOFRAN) IV  Anti-infectives: 4/23 >> Vancomycin >> 4/23 >> Aztreonam >> 4/23 >> Zithromax (MD) >>  Assessment: 89 yof with steroid-dependent COPD, recently admitted 4/5-4/11 for PNA (tx with levaquin) presents 4/23 with CXR shows persistent PNA. Of note, sputum culture from March 2014 grew pseudomonas sensitive to all agents tested. Per CCM, continue Vanc/Aztreonam/Zithromax  for treatment of HCAP with hx Pseudomonas.  Afeb  WBC elevated at 18.2k  Renal function wnl, SCr 0.88, CrCl 50 ml/min  Blood cultures pending, sputum culture ordered   Goal of Therapy:  Vancomycin trough level 15-20 mcg/ml Abx doses appropriate for weight/renal function  Plan:   Vancomycin 750mg  IV x 1, then 500mg  IV q12h Check trough at steady state Aztreonam 2g x 1 given, continue with 1g IV q8h Continue Zithromax per MD Follow up renal function & cultures  Peggyann Juba, PharmD, BCPS Pager: [phone] 01-01-14,5:50 PM

## 2013-12-03 NOTE — ED Provider Notes (Signed)
CSN: [id number]     Arrival date & time 11/17/2013  1217 History   First MD Initiated Contact with Patient 11/23/2013 1229     Chief Complaint  Patient presents with  . Shortness of Breath  . COPD     (Consider location/radiation/quality/duration/timing/severity/associated sxs/prior Treatment) Patient is a 73 y.o. female presenting with shortness of breath. The history is provided by the patient and the spouse.  Shortness of Breath Severity:  Moderate Onset quality:  Gradual Duration:  3 weeks Timing:  Constant Progression:  Worsening Chronicity:  Recurrent Context comment:  At rest Relieved by:  Nothing Worsened by:  Nothing tried Ineffective treatments: steroids. Associated symptoms: cough (productive of yellow sputum)   Associated symptoms: no abdominal pain, no chest pain, no fever, no headaches, no neck pain and no vomiting   Cough:    Cough characteristics:  Productive   Sputum characteristics:  Yellow   Severity:  Mild   Onset quality:  Gradual   Duration:  3 weeks   Timing:  Constant   Progression:  Unchanged   Chronicity:  New   Past Medical History  Diagnosis Date  . Polio   . Osteoporosis     multiple compression fractures  . Crohn's disease     since 1966 on chronic steroids s/p ileal resection, fistula repair  . Brain tumor   . Migraines   . Hypercholesteremia   . Hypertension   . GERD (gastroesophageal reflux disease)   . Anxiety   . Depression   . Emphysema   . Macular degeneration   . Cataract   . Arthritis   . Pernicious anemia   . Primary adrenal deficiency   . COPD (chronic obstructive pulmonary disease)   . Neuropathy, idiopathic   . Anticoagulated on warfarin     Hx of DVT, PE  . Deafness in right ear    Past Surgical History  Procedure Laterality Date  . Cholecystectomy    . Cataract repair    . Ileal resection    . Hemicolectomy      rt due to crohns  . Colonoscopy  08/17/2011    Procedure: COLONOSCOPY;  Surgeon: Landry Dyke,  MD;  Location: [facility];  Service: Endoscopy;  Laterality: N/A;  . Givens capsule study  09/27/2011    Procedure: GIVENS CAPSULE STUDY;  Surgeon: Landry Dyke, MD;  Location: [facility];  Service: Endoscopy;  Laterality: N/A;   Family History  Problem Relation Age of Onset  . Anesthesia problems Neg Hx   . Hypotension Neg Hx   . Malignant hyperthermia Neg Hx   . Pseudochol deficiency Neg Hx    History  Substance Use Topics  . Smoking status: Former Smoker -- 1.00 packs/day for 55 years    Types: Cigarettes    Quit date: 10/28/2011  . Smokeless tobacco: Never Used  . Alcohol Use: No   OB History   Grav Para Term Preterm Abortions TAB SAB Ect Mult Living                 Review of Systems  Constitutional: Negative for fever and fatigue.  HENT: Negative for congestion and drooling.   Eyes: Negative for pain.  Respiratory: Positive for cough (productive of yellow sputum). Negative for shortness of breath.   Cardiovascular: Negative for chest pain.  Gastrointestinal: Negative for nausea, vomiting, abdominal pain and diarrhea.  Genitourinary: Negative for dysuria and hematuria.  Musculoskeletal: Negative for back pain, gait problem and neck pain.  Skin: Negative  for color change.  Neurological: Negative for dizziness and headaches.  Hematological: Negative for adenopathy.  Psychiatric/Behavioral: Negative for behavioral problems.  All other systems reviewed and are negative.     Allergies  Celebrex; Penicillins; Smz-tmp ds; Ceclor; Ciprofloxacin hcl; Diclofenac sodium; Doxycycline; Flagyl; Hydrocodone; Iohexol; Other; Percocet; Prilosec; and Tylenol  Home Medications   Prior to Admission medications   Medication Sig Start Date End Date Taking? Authorizing Provider  albuterol (PROVENTIL) (5 MG/ML) 0.5% nebulizer solution Take 0.5 mLs (2.5 mg total) by nebulization every 6 (six) hours as needed for wheezing or shortness of breath. 11/21/13  Yes [name], MD   ALPRAZolam Duanne Moron) 0.25 MG tablet Take 0.25 mg by mouth 3 (three) times daily as needed for anxiety.    Yes Historical Provider, MD  amLODipine (NORVASC) 2.5 MG tablet Take 2.5 mg by mouth at bedtime.    Yes Historical Provider, MD  atorvastatin (LIPITOR) 40 MG tablet Take 40 mg by mouth every evening.  05/12/12  Yes Historical Provider, MD  calcium carbonate (OS-CAL) 600 MG TABS Take 600 mg by mouth every evening.    Yes Historical Provider, MD  Cholecalciferol (VITAMIN D3) 50000 UNITS CAPS Take 100,000 Units by mouth See admin instructions. Takes one capsule on Sunday and Takes one capsule on Wednesday   Yes Historical Provider, MD  DULoxetine (CYMBALTA) 60 MG capsule Take 60 mg by mouth every morning.    Yes Historical Provider, MD  fentaNYL (DURAGESIC - DOSED MCG/HR) 75 MCG/HR Place 75 mcg onto the skin every other day.    Yes Historical Provider, MD  ferrous sulfate 325 (65 FE) MG tablet Take 325 mg by mouth 3 (three) times daily with meals.   Yes Historical Provider, MD  gabapentin (NEURONTIN) 300 MG capsule Take 300 mg by mouth at bedtime.   Yes Historical Provider, MD  mometasone-formoterol (DULERA) 200-5 MCG/ACT AERO Inhale 2 puffs into the lungs 2 (two) times daily.   Yes Historical Provider, MD  nadolol (CORGARD) 20 MG tablet Take 20 mg by mouth every evening.    Yes Historical Provider, MD  oxyCODONE-acetaminophen (PERCOCET/ROXICET) 5-325 MG per tablet Take 0.5-1 tablets by mouth every 4 (four) hours as needed for severe pain.   Yes Historical Provider, MD  polyethylene glycol (MIRALAX / GLYCOLAX) packet Take 17 g by mouth daily as needed for mild constipation.   Yes Historical Provider, MD  potassium chloride (KLOR-CON) 10 MEQ CR tablet Take 10 mEq by mouth 2 (two) times daily.    Yes Historical Provider, MD  predniSONE (DELTASONE) 20 MG tablet Take 10 mg by mouth daily with breakfast.   Yes Historical Provider, MD  Probiotic Product (ALIGN) 4 MG CAPS Take 1 capsule by mouth every  morning.    Yes Historical Provider, MD  promethazine (PHENERGAN) 25 MG tablet Take 25 mg by mouth every 6 (six) hours as needed for nausea. Nausea   Yes Historical Provider, MD  ranitidine (ZANTAC) 300 MG capsule Take 300 mg by mouth 2 (two) times daily.    Yes Historical Provider, MD  Rivaroxaban 20 MG TABS Take 20 mg by mouth every evening.    Yes Historical Provider, MD  Teriparatide, Recombinant, (FORTEO [place]) Inject 20 mcg into the skin every morning.    Yes Historical Provider, MD  tolterodine (DETROL) 2 MG tablet Take 2 mg by mouth every morning.    Yes Historical Provider, MD  traMADol (ULTRAM) 50 MG tablet Take 100 mg by mouth every 8 (eight) hours as needed for  pain.    Yes Historical Provider, MD   BP 151/102  Pulse 105  Resp 22  SpO2 92% Physical Exam  Nursing note and vitals reviewed. Constitutional: She is oriented to person, place, and time. She appears well-developed and well-nourished.  HENT:  Head: Normocephalic.  Mouth/Throat: Oropharynx is clear and moist. No oropharyngeal exudate.  Eyes: Conjunctivae and EOM are normal. Pupils are equal, round, and reactive to light.  Neck: Normal range of motion. Neck supple.  Cardiovascular: Normal rate, regular rhythm, normal heart sounds and intact distal pulses.  Exam reveals no gallop and no friction rub.   No murmur heard. Pulmonary/Chest: She is in respiratory distress.  Mild respiratory distress. Decreased breath sounds diffusely.  Abdominal: Soft. Bowel sounds are normal. There is no tenderness. There is no rebound and no guarding.  Musculoskeletal: Normal range of motion. She exhibits no edema and no tenderness.  Neurological: She is alert and oriented to person, place, and time.  Skin: Skin is dry.  Cool lower extremities.  Psychiatric: She has a normal mood and affect. Her behavior is normal.    ED Course  Procedures (including critical care time) Labs Review Labs Reviewed  CBC WITH DIFFERENTIAL - Abnormal; Notable  for the following:    WBC 18.2 (*)    HCT 47.4 (*)    MCV 101.9 (*)    Neutrophils Relative % 87 (*)    Neutro Abs 15.9 (*)    Lymphocytes Relative 6 (*)    Monocytes Absolute 1.3 (*)    All other components within normal limits  COMPREHENSIVE METABOLIC PANEL - Abnormal; Notable for the following:    CO2 35 (*)    Glucose, Bld 150 (*)    Albumin 2.8 (*)    ALT 47 (*)    Alkaline Phosphatase 172 (*)    GFR calc non Af Amer 65 (*)    GFR calc Af Amer 75 (*)    All other components within normal limits  PRO B NATRIURETIC PEPTIDE - Abnormal; Notable for the following:    Pro B Natriuretic peptide (BNP) 2859.0 (*)    All other components within normal limits  BLOOD GAS, ARTERIAL - Abnormal; Notable for the following:    pCO2 arterial 56.6 (*)    pO2, Arterial 53.3 (*)    Bicarbonate 35.1 (*)    Acid-Base Excess 8.3 (*)    All other components within normal limits  MRSA PCR SCREENING  CULTURE, BLOOD (ROUTINE X 2)  CULTURE, BLOOD (ROUTINE X 2)  CULTURE, EXPECTORATED SPUTUM-ASSESSMENT  TROPONIN I  PROTIME-INR  URINALYSIS, ROUTINE W REFLEX MICROSCOPIC  COMPREHENSIVE METABOLIC PANEL  CBC    Imaging Review Dg Chest 2 View  11/14/2013   CLINICAL DATA:  COPD, shortness of breath  EXAM: CHEST  2 VIEW  COMPARISON:  DG CHEST 2 VIEW dated 11/20/2013; DG CHEST 1V PORT dated 11/15/2013; DG CHEST 2 VIEW dated 12/31/2012  FINDINGS: There is hazy bibasilar airspace disease which is less pronounced compared with the prior exams likely rib reflecting resolving pneumonia. There is hazy right upper lobe airspace disease likely reflecting pneumonia. There is no pleural effusion or pneumothorax. Stable cardiomediastinal silhouette. There are multiple old right posterior rib deformities.  IMPRESSION: 1. Bibasilar hazy airspace disease improved compared with the prior exam. Hazy right upper lobe airspace disease more prominent compared with the prior exam. These findings are most compatible with persistent  pneumonia.   Electronically Signed   By: Kathreen Devoid   On: 11/24/2013 13:39  EKG Interpretation   Date/Time:  Thursday December 03 2013 12:36:03 EDT Ventricular Rate:  113 PR Interval:  160 QRS Duration: 75 QT Interval:  301 QTC Calculation: 413 R Axis:   73 Text Interpretation:  Sinus tachycardia No significant change since last  tracing Confirmed by Taleya Whitcher  MD, Shakerria Parran ([id number]) on 12/02/2013 8:41:54 PM      MDM   Final diagnoses:  HCAP (healthcare-associated pneumonia)  COPD exacerbation  SOB (shortness of breath)    1:05 PM 73 y.o. female with a history of COPD on 4L [place], DVT/PE on xarelto, s/p recent admission for copd exac and pna who presents with worsening shortness of breath over the last few weeks. Home health checked her oxygen saturation today and it was found to be 80% on 4 L via nasal cannula. The husband notes that she's had a gradual decline since her recent discharge. She was seen by her primary care physician 2 days ago and got a shot of steroids. She denies any chest pain on exam. She is afebrile, mildly tachycardic, and tachypneic on exam. Will get screening labs and DuoNeb treatment. Will give another does of solumedrol.   Will cover for HCAP. Discussed abx w/ pharmacy as pt has multiple allergies and reactions. Will give vanc, azithro, aztreonam. Discussed w/ [facility]. Will admit.     Blanchard Kelch, MD 11/18/2013 2044

## 2013-12-03 NOTE — Progress Notes (Signed)
  CARE MANAGEMENT ED NOTE 11/23/2013  Patient:  Katherine, Mckay   Account Number:  [id number]  Date Initiated:  11/16/2013  Documentation initiated by:  Livia Snellen  Subjective/Objective Assessment:   Patient presents to Ed with shortness of breath     Subjective/Objective Assessment Detail:   Patient placed on 55% venti mask post ABG     Action/Plan:   Action/Plan Detail:   Patient to be admitted   Anticipated DC Date:       Status Recommendation to Physician:   Result of Recommendation:    Other ED Green  Other    Choice offered to / List presented to:            Status of service:  Completed, signed off  ED Comments:   ED Comments Detail:  Patient currently has home health services with [facility].  EDCM placed text to [facility] transition care specialist to make her aware of patient's admission.  No further EDCM needs at this time.

## 2013-12-03 NOTE — Progress Notes (Signed)
Post ABG- RT placed PT on 55% VM- current Sp02 92%.

## 2013-12-03 NOTE — H&P (Signed)
PCP:   Katherine Stack, MD   Chief Complaint:  Hypoxia  HPI: This is a 73 year old white female long-standing patient of my practice. [name] been out of the hospital about 2 weeks after significant pneumonia and COPD exacerbation. I saw her several days ago in the office at which time she was doing relatively well. [name] been receiving home health care, and was able to do some exercise yesterday even though she was somewhat weak. Today when the visiting nurse arrived her O2 sats were in the 80% range and she was weak and struggling for breath. She took her inhalers without much help. She then brought to the emergency room and evaluated both by the ER physician as well as the pulmonary critical care team and was found to have respiratory failure with worsening pneumonia and COPD. She has severe COPD this worsened by extreme kyphosis due to multiple compression fractures from severe osteoporosis. [name] previously been a very heavy smoker but not in the last couple of years. She also has problems with chronic pulmonary embolism and is on Xarelto and long term. She doesn't have any significant component of congestive heart failure as part of this problem. [name] eaten very little for the last 2 days due to her breathlessness. She has a fullness in her chest with a feeling of hyperexpansion and trouble getting air out. [name] had a productive cough with yellow phlegm. [name] had some sweats but no measured fever. She has coughing paroxysms at times. Her peripheral edema is at baseline. She is weak and tired after these episodes. Her bowels are doing well. She has no abdominal pain. She is overall quite weak. Her vision is at baseline. Her back pain is moderately well-controlled. [name] had no new event since he just had another compression fracture. She does however have several fractured vertebra recently has been on Forteo for this.   Review of Systems:  Review of Systems -  Past Medical History: Past  Medical History  Diagnosis Date  . Polio   . Osteoporosis     multiple compression fractures  . Crohn's disease     since 1966 on chronic steroids s/p ileal resection, fistula repair  . Brain tumor, history of acoustic neuroma    . Migraines   . Hypercholesteremia   . Hypertension   . GERD (gastroesophageal reflux disease)   . Anxiety   . Depression   . Emphysema   . Macular degeneration   . Cataract   . Arthritis   . Pernicious anemia   .  secondary adrenal deficiency   . COPD (chronic obstructive pulmonary disease)   . Neuropathy, idiopathic   . Anticoagulated on warfarin     Hx of DVT, PE  . Deafness in right ear    Past Surgical History  Procedure Laterality Date  . Cholecystectomy    . Cataract repair    . Ileal resection    . Hemicolectomy      rt due to crohns  . Colonoscopy  08/17/2011    Procedure: COLONOSCOPY;  Surgeon: Landry Dyke, MD;  Location: [facility];  Service: Endoscopy;  Laterality: N/A;  . Givens capsule study  09/27/2011    Procedure: GIVENS CAPSULE STUDY;  Surgeon: Landry Dyke, MD;  Location: [facility];  Service: Endoscopy;  Laterality: N/A;    Medications: Prior to Admission medications   Medication Sig Start Date End Date Taking? Authorizing Provider  albuterol (PROVENTIL) (5 MG/ML) 0.5% nebulizer solution Take 0.5 mLs (2.5 mg total) by  nebulization every 6 (six) hours as needed for wheezing or shortness of breath. 11/21/13  Yes Katherine Stack, MD  ALPRAZolam Duanne Moron) 0.25 MG tablet Take 0.25 mg by mouth 3 (three) times daily as needed for anxiety.    Yes Historical Provider, MD  amLODipine (NORVASC) 2.5 MG tablet Take 2.5 mg by mouth at bedtime.    Yes Historical Provider, MD  atorvastatin (LIPITOR) 40 MG tablet Take 40 mg by mouth every evening.  05/12/12  Yes Historical Provider, MD  calcium carbonate (OS-CAL) 600 MG TABS Take 600 mg by mouth every evening.    Yes Historical Provider, MD  Cholecalciferol (VITAMIN D3) 50000 UNITS  CAPS Take 100,000 Units by mouth See admin instructions. Takes one capsule on Sunday and Takes one capsule on Wednesday   Yes Historical Provider, MD  DULoxetine (CYMBALTA) 60 MG capsule Take 60 mg by mouth every morning.    Yes Historical Provider, MD  fentaNYL (DURAGESIC - DOSED MCG/HR) 75 MCG/HR Place 75 mcg onto the skin every other day.    Yes Historical Provider, MD  ferrous sulfate 325 (65 FE) MG tablet Take 325 mg by mouth 3 (three) times daily with meals.   Yes Historical Provider, MD  gabapentin (NEURONTIN) 300 MG capsule Take 300 mg by mouth at bedtime.   Yes Historical Provider, MD  mometasone-formoterol (DULERA) 200-5 MCG/ACT AERO Inhale 2 puffs into the lungs 2 (two) times daily.   Yes Historical Provider, MD  nadolol (CORGARD) 20 MG tablet Take 20 mg by mouth every evening.    Yes Historical Provider, MD  oxyCODONE-acetaminophen (PERCOCET/ROXICET) 5-325 MG per tablet Take 0.5-1 tablets by mouth every 4 (four) hours as needed for severe pain.   Yes Historical Provider, MD  polyethylene glycol (MIRALAX / GLYCOLAX) packet Take 17 g by mouth daily as needed for mild constipation.   Yes Historical Provider, MD  potassium chloride (KLOR-CON) 10 MEQ CR tablet Take 10 mEq by mouth 2 (two) times daily.    Yes Historical Provider, MD  predniSONE (DELTASONE) 20 MG tablet Take 10 mg by mouth daily with breakfast.   Yes Historical Provider, MD  Probiotic Product (ALIGN) 4 MG CAPS Take 1 capsule by mouth every morning.    Yes Historical Provider, MD  promethazine (PHENERGAN) 25 MG tablet Take 25 mg by mouth every 6 (six) hours as needed for nausea. Nausea   Yes Historical Provider, MD  ranitidine (ZANTAC) 300 MG capsule Take 300 mg by mouth 2 (two) times daily.    Yes Historical Provider, MD  Rivaroxaban 20 MG TABS Take 20 mg by mouth every evening.    Yes Historical Provider, MD  Teriparatide, Recombinant, (FORTEO Jennings) Inject 20 mcg into the skin every morning.    Yes Historical Provider, MD   tolterodine (DETROL) 2 MG tablet Take 2 mg by mouth every morning.    Yes Historical Provider, MD  traMADol (ULTRAM) 50 MG tablet Take 100 mg by mouth every 8 (eight) hours as needed for pain.    Yes Historical Provider, MD    Allergies:   Allergies  Allergen Reactions  . Celebrex [Celecoxib] Shortness Of Breath    No voiding  . Penicillins Shortness Of Breath and Rash  . Smz-Tmp Ds [Sulfamethoxazole W/Trimethoprim (Co-Trimoxazole)] Rash  . Ceclor [Cefaclor] Other (See Comments)    Stomach pain  . Ciprofloxacin Hcl     Possible GI Bleed.  . Diclofenac Sodium     Pt doesn't remember reaction  . Doxycycline  Stomach pains.  . Flagyl [Metronidazole Hcl]     Vaginal rash  . Hydrocodone Other (See Comments)    Severe stomach pain  . Iohexol      Code: HIVES, Desc: pt states she breaks out in hives and sneezes 10/01/08   Desc: STATES SHE ONLY REACTS BY SNEEZING   . Other Other (See Comments)    Darvocet - liver damage  . Percocet [Oxycodone-Acetaminophen] Other (See Comments)    Liver damage  . Prilosec [Omeprazole] Other (See Comments)    Causes ulcers in stomach and esophagus  . Tylenol [Acetaminophen] Other (See Comments)    Liver damage    Social History:  reports that she quit smoking about 2 years ago. Her smoking use included Cigarettes. She has a 55 pack-year smoking history. She has never used smokeless tobacco. She reports that she does not drink alcohol or use illicit drugs.  Family History: Family History  Problem Relation Age of Onset  . Anesthesia problems Neg Hx   . Hypotension Neg Hx   . Malignant hyperthermia Neg Hx   . Pseudochol deficiency Neg Hx     Physical Exam: Filed Vitals:   12/08/2013 1415 11/13/2013 1530 12/04/2013 1552 11/18/2013 1730  BP: 155/96  157/101 165/144  Pulse: 92 87 87 89  Resp: 24 20 18 26   SpO2: 91% 92% 93% 88%   General appearance: Frail, pale, white female lying at about 30 with oxygen mask in place Head: Normocephalic,  without obvious abnormality, atraumatic Eyes: Sclera are anicteric, extraocular movements are intact without nystagmus. She is edentulous with moist oral membranes Neck: no adenopathy, no carotid bruit, no JVD and thyroid not enlarged, symmetric, no tenderness/mass/nodules Resp: Distant with some wheezing and increased work of breathing and excess her muscles in use. Marked kyphosis is noted Cardio: Tachycardic with rare skips. Systolic murmur is present GI: Protuberant soft, non-tender; bowel sounds hypoactive; well-healed scars Extremities: Reduced pulses with 1+ edema and chronic venous stasis changes Skin is thin with bruising, no pigmentary changes to suggest adrenal insufficiency but many scars Neurologic: Awake and alert but weak and tremulous. Mentating well. Labs on Admission:   Recent Labs  12/01/2013 1250  NA 142  K 4.7  CL 96  CO2 35*  GLUCOSE 150*  BUN 17  CREATININE 0.88  CALCIUM 9.9    Recent Labs  11/27/2013 1250  AST 29  ALT 47*  ALKPHOS 172*  BILITOT 0.7  PROT 6.9  ALBUMIN 2.8*     Recent Labs  11/12/2013 1250  WBC 18.2*  NEUTROABS 15.9*  HGB 15.0  HCT 47.4*  MCV 101.9*  PLT 366    Recent Labs  11/21/2013 1250  TROPONINI <0.30   Lab Results  Component Value Date   INR 1.07 11/15/2013   INR 1.70* 11/15/2013   INR 1.69* 01/28/2012      Radiological Exams on Admission: Dg Chest 2 View  11/25/2013   CLINICAL DATA:  COPD, shortness of breath  EXAM: CHEST  2 VIEW  COMPARISON:  DG CHEST 2 VIEW dated 11/20/2013; DG CHEST 1V PORT dated 11/15/2013; DG CHEST 2 VIEW dated 12/31/2012  FINDINGS: There is hazy bibasilar airspace disease which is less pronounced compared with the prior exams likely rib reflecting resolving pneumonia. There is hazy right upper lobe airspace disease likely reflecting pneumonia. There is no pleural effusion or pneumothorax. Stable cardiomediastinal silhouette. There are multiple old right posterior rib deformities.  IMPRESSION: 1.  Bibasilar hazy airspace disease improved compared with the prior exam.  Hazy right upper lobe airspace disease more prominent compared with the prior exam. These findings are most compatible with persistent pneumonia.   Electronically Signed   By: Kathreen Devoid   On: 12/06/2013 13:39   Dg Chest 2 View  11/20/2013   CLINICAL DATA:  Cough.  Pneumonia.  EXAM: CHEST  2 VIEW  COMPARISON:  PA and lateral chest 11/17/2013. Single view of the chest 11/15/2013. CT chest 12/08/2012.  FINDINGS: The lungs are emphysematous. There is right worse than left basilar airspace disease which has progressed since the prior study. No pneumothorax is identified. Trace right pleural effusion is noted. Heart size is normal.  IMPRESSION: Worsened right greater than left basilar airspace disease and small right effusion could be due to pneumonia or asymmetric edema.  Emphysema.   Electronically Signed   By: Inge Rise M.D.   On: 11/20/2013 09:28   Dg Chest 2 View  11/17/2013   CLINICAL DATA:  Cough.  Shortness of breath  EXAM: CHEST  2 VIEW  COMPARISON:  DG CHEST 1V PORT dated 11/15/2013; DG CHEST 2 VIEW dated 12/31/2012  FINDINGS: Mediastinum Edit hilar structures are normal. Poor inspiration with basilar atelectasis. Mild infiltrate medial right lung base cannot be excluded. No pleural effusion or pneumothorax. Heart size normal. Degenerative changes thoracic spine with the multiple compression fractures, these appear stable.  IMPRESSION: 1. Poor inspiration with mild basilar atelectasis, infiltrate and medial right lung base cannot be excluded. 2. Diffuse thoracic spine osteopenia, DJD, multiple compression fractures.   Electronically Signed   By: Marcello Moores  Register   On: 11/17/2013 10:15   Ct Head Wo Contrast  11/15/2013   CLINICAL DATA:  Headaches  EXAM: CT HEAD WITHOUT CONTRAST  TECHNIQUE: Contiguous axial images were obtained from the base of the skull through the vertex without intravenous contrast.  COMPARISON:  12/27/2012   FINDINGS: Bony calvarium is intact. Mild atrophic changes are again identified. Scattered areas of decreased attenuation identified consistent with chronic white matter ischemic change. Basal ganglia lacunar infarcts are again identified bilaterally. No findings to suggest acute hemorrhage, acute infarction or space-occupying mass lesion are noted.  IMPRESSION: Chronic changes without acute abnormality.   Electronically Signed   By: Inez Catalina M.D.   On: 11/15/2013 08:43   Dg Chest Port 1 View  11/15/2013   CLINICAL DATA:  Shortness of breath  EXAM: PORTABLE CHEST - 1 VIEW  COMPARISON:  12/31/2012  FINDINGS: Cardiac shadow is within normal limits. Diffuse interstitial changes are noted throughout both lungs. No focal infiltrate is seen. No acute bony abnormality is noted.  IMPRESSION: Chronic changes without acute abnormality.   Electronically Signed   By: Inez Catalina M.D.   On: 11/15/2013 08:24   Orders placed during the hospital encounter of 11/27/2013  . ED EKG: 03-Dec-2013 12:36:03 [facility] ED ROUTINE RECORD Sinus tachycardia  .   .   .   .   .    Results for ADAHLIA, STEMBRIDGE (MRN [id number]) as of 11/11/2013 18:10  Ref. Range 11/24/2013 14:52  Sample type No range found ARTERIAL DRAW  Delivery systems No range found NASAL CANNULA  O2 Content No range found 5.0  pH, Arterial Latest Range: 7.350-7.450  7.409  pCO2 arterial Latest Range: 35.0-45.0 mmHg 56.6 (H)  pO2, Arterial Latest Range: 80.0-100.0 mmHg 53.3 (L)  Bicarbonate Latest Range: 20.0-24.0 mEq/L 35.1 (H)  TCO2 Latest Range: 0-100 mmol/L 29.6  Acid-Base Excess Latest Range: 0.0-2.0 mmol/L 8.3 (H)  O2 Saturation  No range found 87.7  Patient temperature No range found 98.6  Collection site No range found RIGHT RADIAL  Allens test (pass/fail) Latest Range: PASS  PASS   Assessment/Plan Principal Problem:   Acute and chronic respiratory failure: This is a recurrent problem the setting of severe COPD. Not really  sure what tipped her over the last time but she seemed to be in a different phase of her disease. She did respond to steroids and broad-spectrum antibiotics albeit slowly. She is now on triple antibiotics IV steroids and supplemental oxygen. Blood gases as above with significant hypoxia and chronic hypercarbia. Pulmonary is on board. The patient has previously been at DO NOT INTUBATE and will continue this status. I believe she is willing to do BiPAP if needed. It is Worrisome that she's this sick so soon after the last dose. Elevated white count is noted as well Active Problems:   Crohn's disease: This is been stable without recent fistula problems, bleeding or pain   Chronic pain syndrome: She is maintained on many medications with fair control   Recurrent pulmonary embolism: She is on anticoagulation   COPD (chronic obstructive pulmonary disease) with emphysema: As above   HCAP (healthcare-associated pneumonia): As above   Osteoporosis with fracture: With chronic back pain   Severe protein-calorie malnutrition: Difficult reverse Impaired glucose tolerance with stress hyperglycemia: Sliding scale will be added Chronic adrenal insufficiency: She will remain on steroids Hypertension: Medications will be restarted 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

## 2013-12-04 ENCOUNTER — Inpatient Hospital Stay (HOSPITAL_COMMUNITY): Payer: Medicare Other

## 2013-12-04 DIAGNOSIS — J9620 Acute and chronic respiratory failure, unspecified whether with hypoxia or hypercapnia: Secondary | ICD-10-CM

## 2013-12-04 LAB — COMPREHENSIVE METABOLIC PANEL
ALBUMIN: 2.5 g/dL — AB (ref 3.5–5.2)
ALT: 38 U/L — ABNORMAL HIGH (ref 0–35)
AST: 19 U/L (ref 0–37)
Alkaline Phosphatase: 150 U/L — ABNORMAL HIGH (ref 39–117)
BUN: 23 mg/dL (ref 6–23)
CALCIUM: 9.6 mg/dL (ref 8.4–10.5)
CO2: 36 mEq/L — ABNORMAL HIGH (ref 19–32)
CREATININE: 0.91 mg/dL (ref 0.50–1.10)
Chloride: 93 mEq/L — ABNORMAL LOW (ref 96–112)
GFR calc Af Amer: 72 mL/min — ABNORMAL LOW (ref >90)
GFR calc non Af Amer: 62 mL/min — ABNORMAL LOW (ref >90)
Glucose, Bld: 138 mg/dL — ABNORMAL HIGH (ref 70–99)
Potassium: 5.1 mEq/L (ref 3.7–5.3)
Sodium: 138 mEq/L (ref 137–147)
Total Bilirubin: 0.6 mg/dL (ref 0.3–1.2)
Total Protein: 6.3 g/dL (ref 6.0–8.3)

## 2013-12-04 LAB — GLUCOSE, CAPILLARY
GLUCOSE-CAPILLARY: 98 mg/dL (ref 70–99)
Glucose-Capillary: 117 mg/dL — ABNORMAL HIGH (ref 70–99)
Glucose-Capillary: 301 mg/dL — ABNORMAL HIGH (ref 70–99)
Glucose-Capillary: 142 mg/dL — ABNORMAL HIGH (ref 70–99)

## 2013-12-04 LAB — EXPECTORATED SPUTUM ASSESSMENT W REFEX TO RESP CULTURE

## 2013-12-04 LAB — CBC
HCT: 44.1 % (ref 36.0–46.0)
Hemoglobin: 13.5 g/dL (ref 12.0–15.0)
MCH: 31.1 pg (ref 26.0–34.0)
MCHC: 30.6 g/dL (ref 30.0–36.0)
MCV: 101.6 fL — AB (ref 78.0–100.0)
Platelets: 300 10*3/uL (ref 150–400)
RBC: 4.34 MIL/uL (ref 3.87–5.11)
RDW: 14 % (ref 11.5–15.5)
WBC: 12.9 10*3/uL — ABNORMAL HIGH (ref 4.0–10.5)

## 2013-12-04 LAB — EXPECTORATED SPUTUM ASSESSMENT W GRAM STAIN, RFLX TO RESP C

## 2013-12-04 MED ORDER — HYDROMORPHONE HCL PF 1 MG/ML IJ SOLN
1.0000 mg | INTRAMUSCULAR | Status: DC | PRN
  Administered 2013-12-04 – 2013-12-10 (×24): 1 mg via INTRAVENOUS
  Filled 2013-12-04 (×26): qty 1

## 2013-12-04 MED ORDER — OXYCODONE-ACETAMINOPHEN 5-325 MG PO TABS
1.0000 | ORAL_TABLET | ORAL | Status: DC | PRN
  Administered 2013-12-04 – 2013-12-07 (×14): 2 via ORAL
  Administered 2013-12-07: 1 via ORAL
  Administered 2013-12-07 – 2013-12-12 (×13): 2 via ORAL
  Administered 2013-12-12: 1 via ORAL
  Administered 2013-12-13: 2 via ORAL
  Filled 2013-12-04 (×8): qty 2
  Filled 2013-12-04 (×2): qty 1
  Filled 2013-12-04 (×9): qty 2
  Filled 2013-12-04: qty 1
  Filled 2013-12-04 (×3): qty 2
  Filled 2013-12-04: qty 1
  Filled 2013-12-04 (×7): qty 2

## 2013-12-04 MED ORDER — FUROSEMIDE 10 MG/ML IJ SOLN
40.0000 mg | Freq: Three times a day (TID) | INTRAMUSCULAR | Status: AC
  Administered 2013-12-04 (×2): 40 mg via INTRAVENOUS
  Filled 2013-12-04 (×2): qty 4

## 2013-12-04 MED ORDER — GUAIFENESIN ER 600 MG PO TB12
1200.0000 mg | ORAL_TABLET | Freq: Two times a day (BID) | ORAL | Status: DC
  Administered 2013-12-04 – 2013-12-07 (×7): 1200 mg via ORAL
  Filled 2013-12-04 (×8): qty 2

## 2013-12-04 MED ORDER — BIOTENE DRY MOUTH MT LIQD
15.0000 mL | Freq: Two times a day (BID) | OROMUCOSAL | Status: DC
  Administered 2013-12-04 – 2013-12-13 (×17): 15 mL via OROMUCOSAL

## 2013-12-04 NOTE — Progress Notes (Signed)
PULMONARY / CRITICAL CARE MEDICINE   Name: Katherine Mckay MRN: [id number] DOB: 02-07-1941    ADMISSION DATE:  11/13/2013  REFERRING MD :  ER  CHIEF COMPLAINT:  Short of breath  BRIEF PATIENT DESCRIPTION:  73 yo female with hypoxia, wheezing and pneumonia.  She has hx of COPD on steroids, bronchiectasis, and home oxygen.  She was recently treated for pneumonia.  SIGNIFICANT EVENTS: 4/23 Admit  STUDIES:  11/10/12 Sputum >> Pseudomonas (pan sensitive) 11/17/12 CT chest >> centrilobular emphysema, mild BTX 12/09/12 PFT >> FEV1 0.94 (51%), FEV1% 45, TLC 5.09 (115%), DLCO 41%  LINES / TUBES: PIV  CULTURES: Blood 4/23 >>   ANTIBIOTICS: Zithromax 4/23 >> Azactam 4/23 >> Vancomycin 4/23 >>   SUBJECTIVE:   VITAL SIGNS: Temp:  [97.5 F (36.4 C)-97.7 F (36.5 C)] 97.5 F (36.4 C) (04/24 0400) Pulse Rate:  [32-105] 64 (04/24 0800) Resp:  [16-26] 18 (04/24 0800) BP: (116-206)/(81-144) 166/91 mmHg (04/24 0800) SpO2:  [87 %-98 %] 90 % (04/24 0421) FiO2 (%):  [45 %-55 %] 55 % (04/24 0752) Weight:  [120 lb 13 oz (54.8 kg)] 120 lb 13 oz (54.8 kg) (04/23 2015) INTAKE / OUTPUT: Intake/Output     04/23 0701 - 04/24 0700 04/24 0701 - 04/25 0700   P.O. 60    I.V. (mL/kg) 100 (1.8)    IV Piggyback 350    Total Intake(mL/kg) 510 (9.3)    Urine (mL/kg/hr) 325    Total Output 325     Net +185          Urine Occurrence 1 x      PHYSICAL EXAMINATION: General: Thin, frail Neuro:  Alert, normal strength HEENT:  No sinus tenderness Cardiovascular:  Regular, no murmur Lungs:  B/l wheeze, scattered rhonchi Abdomen:  Soft, non tender Musculoskeletal:  No edema Skin:  No rashes  LABS:  CBC  Recent Labs Lab 11/30/2013 1250 12/04/13 0312  WBC 18.2* 12.9*  HGB 15.0 13.5  HCT 47.4* 44.1  PLT 366 300   Coag's  Recent Labs Lab 11/26/2013 1250  INR 1.07   BMET  Recent Labs Lab 12/05/2013 1250 12/04/13 0312  NA 142 138  K 4.7 5.1  CL 96 93*  CO2 35* 36*  BUN 17 23   CREATININE 0.88 0.91  GLUCOSE 150* 138*   Electrolytes  Recent Labs Lab 11/20/2013 1250 12/04/13 0312  CALCIUM 9.9 9.6   Sepsis Markers No results found for this basename: LATICACIDVEN, PROCALCITON, O2SATVEN,  in the last 168 hours  ABG  Recent Labs Lab 11/18/2013 1452  PHART 7.409  PCO2ART 56.6*  PO2ART 53.3*   Liver Enzymes  Recent Labs Lab 11/11/2013 1250 12/04/13 0312  AST 29 19  ALT 47* 38*  ALKPHOS 172* 150*  BILITOT 0.7 0.6  ALBUMIN 2.8* 2.5*   Cardiac Enzymes  Recent Labs Lab 11/16/2013 1250  TROPONINI <0.30  PROBNP 2859.0*   Glucose  Recent Labs Lab 12/01/2013 2130  GLUCAP 148*    Imaging Dg Chest 2 View  11/19/2013   CLINICAL DATA:  COPD, shortness of breath  EXAM: CHEST  2 VIEW  COMPARISON:  DG CHEST 2 VIEW dated 11/20/2013; DG CHEST 1V PORT dated 11/15/2013; DG CHEST 2 VIEW dated 12/31/2012  FINDINGS: There is hazy bibasilar airspace disease which is less pronounced compared with the prior exams likely rib reflecting resolving pneumonia. There is hazy right upper lobe airspace disease likely reflecting pneumonia. There is no pleural effusion or pneumothorax. Stable cardiomediastinal silhouette. There are multiple  old right posterior rib deformities.  IMPRESSION: 1. Bibasilar hazy airspace disease improved compared with the prior exam. Hazy right upper lobe airspace disease more prominent compared with the prior exam. These findings are most compatible with persistent pneumonia.   Electronically Signed   By: Kathreen Devoid   On: 11/12/2013 13:39   Dg Chest Port 1 View  12/04/2013   CLINICAL DATA:  Pneumonia  EXAM: PORTABLE CHEST - 1 VIEW  COMPARISON:  11/30/2013  FINDINGS: Progression of bibasilar infiltrates. Right upper lobe infiltrate also shows mild progression. Findings could be due to pneumonia. Increase in small right effusion.  IMPRESSION: Progression of bilateral airspace disease, most consistent with pneumonia however pulmonary edema could have a similar  appearance.   Electronically Signed   By: Franchot Gallo M.D.   On: 12/04/2013 07:01    ASSESSMENT / PLAN:  PULMONARY A: Acute on chronic respiratory failure 2nd to HCAP, AECOPD, BTX. P:   - Oxygen to keep SpO2 > 92%. - IV solumedrol as ordered. - F/u CXR. - Scheduled BD's. - Bronchial hygiene. - Hold outpt dulera for now. - IS and flutter valve. - Start Humibid LA. - Two doses of lasix ordered.  CARDIOVASCULAR A:  Hx of HTN, hyperlipidemia. Hx of PE on chronic rivaroxaban as outpt P:  - Continue current treatment. - Diureses.  RENAL A:   Pulmonary edema and hyperkalemia. P:   - Lasix x2 doses for fluid overload and hyperkalemia. - BMET in AM.  GASTROINTESTINAL A:   Nutrition. Protein calorie malnutrition. Hx of Crohn's diseasee. P:   - Per primary team.  HEMATOLOGIC A:   Leukocytosis. P:  - F/u CBC.  INFECTIOUS A:   HCAP >> prior hx of Pseudomonas in sputum, and on chronic prednisone.  Allergies to PCN.  P:   - Agree with vancomycin, atreonam, zithromax. - F/u cx results.  ENDOCRINE A:   Steroid induced hyperglycemia.  Hx of Osteoporosis.  P:   - Per primary team.  NEUROLOGIC A:   Hx of Anxiety, depression. Chronic neuropathic pain. P:   - Pain medications per primary team.  Rush Farmer, M.D. [facility] Pulmonary/Critical Care Medicine. Pager: [phone]. After hours pager: [phone].

## 2013-12-04 NOTE — Progress Notes (Signed)
[facility]  Patient Status: Active (receiving services up to time of hospitalization)  [facility] is providing the following services: RN and PT  If patient discharges after hours, please call [phone].   Katherine Mckay 12/04/2013, 4:01 PM

## 2013-12-04 NOTE — Progress Notes (Signed)
Subjective: Had quite a bit of back pain. Needing 50% VM for sats around 90.   Objective: Vital signs in last 24 hours: Temp:  [97.5 F (36.4 C)-97.7 F (36.5 C)] 97.5 F (36.4 C) (04/24 0400) Pulse Rate:  [32-105] 49 (04/24 0421) Resp:  [16-26] 25 (04/24 0421) BP: (116-206)/(81-144) 116/81 mmHg (04/24 0400) SpO2:  [87 %-98 %] 90 % (04/24 0421) FiO2 (%):  [45 %-55 %] 55 % (04/24 0752) Weight:  [54.8 kg (120 lb 13 oz)] 54.8 kg (120 lb 13 oz) (04/23 2015)  Intake/Output from previous day: 04/23 0701 - 04/24 0700 In: 450 [P.O.:60; I.V.:90; IV Piggyback:300] Out: 325 [Urine:325] Intake/Output this shift:    A bit more comfortable. Lungs bilat rhonchi, ht tachy. Awake. alert  Lab Results   Recent Labs  11/15/2013 1250 12/04/13 0312  WBC 18.2* 12.9*  RBC 4.65 4.34  HGB 15.0 13.5  HCT 47.4* 44.1  MCV 101.9* 101.6*  MCH 32.3 31.1  RDW 14.0 14.0  PLT 366 300    Recent Labs  11/19/2013 1250 12/04/13 0312  NA 142 138  K 4.7 5.1  CL 96 93*  CO2 35* 36*  GLUCOSE 150* 138*  BUN 17 23  CREATININE 0.88 0.91  CALCIUM 9.9 9.6    Studies/Results: Dg Chest 2 View  11/29/2013   CLINICAL DATA:  COPD, shortness of breath  EXAM: CHEST  2 VIEW  COMPARISON:  DG CHEST 2 VIEW dated 11/20/2013; DG CHEST 1V PORT dated 11/15/2013; DG CHEST 2 VIEW dated 12/31/2012  FINDINGS: There is hazy bibasilar airspace disease which is less pronounced compared with the prior exams likely rib reflecting resolving pneumonia. There is hazy right upper lobe airspace disease likely reflecting pneumonia. There is no pleural effusion or pneumothorax. Stable cardiomediastinal silhouette. There are multiple old right posterior rib deformities.  IMPRESSION: 1. Bibasilar hazy airspace disease improved compared with the prior exam. Hazy right upper lobe airspace disease more prominent compared with the prior exam. These findings are most compatible with persistent pneumonia.   Electronically Signed   By: Kathreen Devoid    On: 11/24/2013 13:39   Dg Chest Port 1 View  12/04/2013   CLINICAL DATA:  Pneumonia  EXAM: PORTABLE CHEST - 1 VIEW  COMPARISON:  11/22/2013  FINDINGS: Progression of bibasilar infiltrates. Right upper lobe infiltrate also shows mild progression. Findings could be due to pneumonia. Increase in small right effusion.  IMPRESSION: Progression of bilateral airspace disease, most consistent with pneumonia however pulmonary edema could have a similar appearance.   Electronically Signed   By: Franchot Gallo M.D.   On: 12/04/2013 07:01    Scheduled Meds: . ALIGN  1 capsule Oral q morning - 10a  . amLODipine  2.5 mg Oral QHS  . atorvastatin  40 mg Oral QPM  . azithromycin  500 mg Intravenous Q24H  . aztreonam  1 g Intravenous 3 times per day  . calcium carbonate  1 tablet Oral QHS  . DULoxetine  60 mg Oral q morning - 10a  . famotidine  20 mg Oral BID  . fentaNYL  75 mcg Transdermal QODAY  . ferrous sulfate  325 mg Oral TID WC  . gabapentin  300 mg Oral QHS  . insulin aspart  0-5 Units Subcutaneous QHS  . insulin aspart  0-9 Units Subcutaneous TID WC  . ipratropium-albuterol  3 mL Nebulization Q6H WA  . methylPREDNISolone (SOLU-MEDROL) injection  40 mg Intravenous Q6H  . nadolol  20 mg Oral QPM  .  oxybutynin  5 mg Oral Daily  . rivaroxaban  20 mg Oral QPM  . vancomycin  500 mg Intravenous Q12H   Continuous Infusions: . ipratropium-albuterol     PRN Meds:albuterol, ALPRAZolam, oxyCODONE-acetaminophen, polyethylene glycol, promethazine, traMADol  Assessment/Plan:  Acute and chronic respiratory failure: about the same. Still high O2 needs.  Crohn's disease: This is been stable without recent fistula problems, bleeding or pain  Chronic pain syndrome: She is maintained on many medications with fair control  Recurrent pulmonary embolism: She is on anticoagulation  COPD (chronic obstructive pulmonary disease) with emphysema: As above  HCAP (healthcare-associated pneumonia): WBC better, no fever   Osteoporosis with fracture: will increase Rx  Severe protein-calorie malnutrition: Difficult reverse  Impaired glucose tolerance with stress hyperglycemia: Sliding scale used Chronic adrenal insufficiency: She will remain on steroids  Hypertension: Medications will be restarted  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

## 2013-12-04 NOTE — Progress Notes (Signed)
CARE MANAGEMENT NOTE 12/04/2013  Patient:  Katherine Mckay, Katherine Mckay   Account Number:  [id number]  Date Initiated:  12/04/2013  Documentation initiated by:  DAVIS,RHONDA  Subjective/Objective Assessment:   AECOPD     Action/Plan:   home when resolved; and back to base line  pt is active with advanced home care   Anticipated DC Date:  12/07/2013   Anticipated DC Plan:  [place]  In-house referral  NA      DC Planning Services  CM consult      [facility] Choice  HOME HEALTH   Choice offered to / List presented to:  NA   DME arranged  NA      DME agency  NA     [place] arranged  NA      [place].   Status of service:  In process, will continue to follow Medicare Important Message given?  NA - LOS <3 / Initial given by admissions (If response is "NO", the following Medicare IM given date fields will be blank) Date Medicare IM given:   Date Additional Medicare IM given:    Discharge Disposition:    Per UR Regulation:  Reviewed for med. necessity/level of care/duration of stay  If discussed at [place] of Stay Meetings, dates discussed:    Comments:  [id number]/Rhonda Eldridge Dace, [place], [place] [phone] Chart Reviewed for discharge and hospital needs. Discharge needs at time of review: None present will follow for needs. Review of patient progress due on [id number].

## 2013-12-04 NOTE — Plan of Care (Signed)
Problem: Phase I Progression Outcomes Goal: OOB as tolerated unless otherwise ordered Outcome: Not Progressing SHOB then anxiety with minimal movement in bed or eating.

## 2013-12-05 LAB — BASIC METABOLIC PANEL
BUN: 34 mg/dL — ABNORMAL HIGH (ref 6–23)
CHLORIDE: 96 meq/L (ref 96–112)
CO2: 38 mEq/L — ABNORMAL HIGH (ref 19–32)
CREATININE: 1.05 mg/dL (ref 0.50–1.10)
Calcium: 9.1 mg/dL (ref 8.4–10.5)
GFR calc Af Amer: 61 mL/min — ABNORMAL LOW (ref >90)
GFR, EST NON AFRICAN AMERICAN: 53 mL/min — AB (ref >90)
Glucose, Bld: 120 mg/dL — ABNORMAL HIGH (ref 70–99)
Potassium: 3.9 mEq/L (ref 3.7–5.3)
Sodium: 142 mEq/L (ref 137–147)

## 2013-12-05 LAB — GLUCOSE, CAPILLARY
GLUCOSE-CAPILLARY: 189 mg/dL — AB (ref 70–99)
Glucose-Capillary: 118 mg/dL — ABNORMAL HIGH (ref 70–99)
Glucose-Capillary: 212 mg/dL — ABNORMAL HIGH (ref 70–99)
Glucose-Capillary: 181 mg/dL — ABNORMAL HIGH (ref 70–99)

## 2013-12-05 LAB — PHOSPHORUS: Phosphorus: 3.9 mg/dL (ref 2.3–4.6)

## 2013-12-05 LAB — CBC
HCT: 42.3 % (ref 36.0–46.0)
Hemoglobin: 13.4 g/dL (ref 12.0–15.0)
MCH: 32.4 pg (ref 26.0–34.0)
MCHC: 31.7 g/dL (ref 30.0–36.0)
MCV: 102.2 fL — ABNORMAL HIGH (ref 78.0–100.0)
Platelets: 310 10*3/uL (ref 150–400)
RBC: 4.14 MIL/uL (ref 3.87–5.11)
RDW: 13.8 % (ref 11.5–15.5)
WBC: 13.1 10*3/uL — AB (ref 4.0–10.5)

## 2013-12-05 LAB — MAGNESIUM: MAGNESIUM: 1.7 mg/dL (ref 1.5–2.5)

## 2013-12-05 MED ORDER — METHYLPREDNISOLONE SODIUM SUCC 40 MG IJ SOLR
40.0000 mg | Freq: Two times a day (BID) | INTRAMUSCULAR | Status: DC
  Administered 2013-12-05 – 2013-12-07 (×4): 40 mg via INTRAVENOUS
  Filled 2013-12-05 (×4): qty 1

## 2013-12-05 MED ORDER — AMLODIPINE BESYLATE 5 MG PO TABS
5.0000 mg | ORAL_TABLET | Freq: Every day | ORAL | Status: DC
  Administered 2013-12-05 – 2013-12-12 (×8): 5 mg via ORAL
  Filled 2013-12-05 (×10): qty 1

## 2013-12-05 MED ORDER — IPRATROPIUM-ALBUTEROL 0.5-2.5 (3) MG/3ML IN SOLN
3.0000 mL | RESPIRATORY_TRACT | Status: DC
  Administered 2013-12-05 – 2013-12-07 (×13): 3 mL via RESPIRATORY_TRACT
  Filled 2013-12-05 (×13): qty 3

## 2013-12-05 MED ORDER — MAGNESIUM SULFATE 40 MG/ML IJ SOLN
2.0000 g | Freq: Once | INTRAMUSCULAR | Status: AC
  Administered 2013-12-05: 2 g via INTRAVENOUS
  Filled 2013-12-05: qty 50

## 2013-12-05 MED ORDER — AMLODIPINE BESYLATE 5 MG PO TABS
5.0000 mg | ORAL_TABLET | Freq: Every day | ORAL | Status: AC
  Administered 2013-12-05: 5 mg via ORAL
  Filled 2013-12-05: qty 1

## 2013-12-05 NOTE — Progress Notes (Signed)
PULMONARY / CRITICAL CARE MEDICINE   Name: Katherine Mckay MRN: [id number] DOB: Feb 21, 1941    ADMISSION DATE:  12-17-13  REFERRING MD :  EDP  CHIEF COMPLAINT:  Short of breath  BRIEF PATIENT DESCRIPTION: 73 yo with COPD and bronchiectasis on chronic steroids steroids recently treated for pneumonia admitted with hypoxia and wheezing.  SIGNIFICANT EVENTS:  STUDIES:  3/31 Sputum >> Pseudomonas (pan sensitive) 4/07 CT chest >> Emphysema / bronchiectasis 4/29 PFT >> FEV1 0.94 (51%), FEV1% 45, TLC 5.09 (115%), DLCO 41%  LINES / TUBES:  CULTURES: MRSA PCR 4/23 >>> neg Blood 4/23 >>   ANTIBIOTICS: Zithromax 4/23 >> Azactam 4/23 >> Vancomycin 4/23 >> 4/25  SUBJECTIVE: No acute events overnight  VITAL SIGNS: Temp:  [97.7 F (36.5 C)-98.7 F (37.1 C)] 98 F (36.7 C) (04/25 0410) Pulse Rate:  [52-67] 52 (04/25 0700) Resp:  [13-23] 13 (04/25 0700) BP: (107-173)/(63-94) 108/63 mmHg (04/25 0534) SpO2:  [87 %-98 %] 98 % (04/25 0805) FiO2 (%):  [45 %] 45 % (04/25 0805) Weight:  [54.4 kg (119 lb 14.9 oz)] 54.4 kg (119 lb 14.9 oz) (04/25 0500)  INTAKE / OUTPUT: Intake/Output     04/24 0701 - 04/25 0700 04/25 0701 - 04/26 0700   P.O. 380    I.V. (mL/kg) 90 (1.7)    IV Piggyback 550    Total Intake(mL/kg) 1020 (18.8)    Urine (mL/kg/hr) 2525 (1.9)    Total Output 2525     Net -1505            PHYSICAL EXAMINATION: General: No distress Neuro:  Awake, alert HEENT:  Moist membranes Cardiovascular:  Regular, no murmur Lungs:  Bilateral air entry, prolonged expiratory phase, few expiratory wheezes Abdomen:  Soft, non tender Musculoskeletal:  No edema Skin:  No rashes  LABS:  CBC  Recent Labs Lab 12-17-2013 1250 12/04/13 0312 12/05/13 0342  WBC 18.2* 12.9* 13.1*  HGB 15.0 13.5 13.4  HCT 47.4* 44.1 42.3  PLT 366 300 310   Coag's  Recent Labs Lab 17-Dec-2013 1250  INR 1.07   BMET  Recent Labs Lab 2013-12-17 1250 12/04/13 0312 12/05/13 0342  NA 142 138 142   K 4.7 5.1 3.9  CL 96 93* 96  CO2 35* 36* 38*  BUN 17 23 34*  CREATININE 0.88 0.91 1.05  GLUCOSE 150* 138* 120*   Electrolytes  Recent Labs Lab Dec 17, 2013 1250 12/04/13 0312 12/05/13 0342  CALCIUM 9.9 9.6 9.1  MG  --   --  1.7  PHOS  --   --  3.9   Sepsis Markers No results found for this basename: LATICACIDVEN, PROCALCITON, O2SATVEN,  in the last 168 hours  ABG  Recent Labs Lab 17-Dec-2013 1452  PHART 7.409  PCO2ART 56.6*  PO2ART 53.3*   Liver Enzymes  Recent Labs Lab 12/17/2013 1250 12/04/13 0312  AST 29 19  ALT 47* 38*  ALKPHOS 172* 150*  BILITOT 0.7 0.6  ALBUMIN 2.8* 2.5*   Cardiac Enzymes  Recent Labs Lab 17-Dec-2013 1250  TROPONINI <0.30  PROBNP 2859.0*   Glucose  Recent Labs Lab 12-17-13 2130 12/04/13 0745 12/04/13 1145 12/04/13 1716 12/04/13 2216  GLUCAP 148* 117* 301* 142* 98   IMAGING:   Dg Chest 2 View  12-17-2013   CLINICAL DATA:  COPD, shortness of breath  EXAM: CHEST  2 VIEW  COMPARISON:  DG CHEST 2 VIEW dated 11/20/2013; DG CHEST 1V PORT dated 11/15/2013; DG CHEST 2 VIEW dated 12/31/2012  FINDINGS: There is hazy  bibasilar airspace disease which is less pronounced compared with the prior exams likely rib reflecting resolving pneumonia. There is hazy right upper lobe airspace disease likely reflecting pneumonia. There is no pleural effusion or pneumothorax. Stable cardiomediastinal silhouette. There are multiple old right posterior rib deformities.  IMPRESSION: 1. Bibasilar hazy airspace disease improved compared with the prior exam. Hazy right upper lobe airspace disease more prominent compared with the prior exam. These findings are most compatible with persistent pneumonia.   Electronically Signed   By: Kathreen Devoid   On: 02-Jan-2014 13:39   Dg Chest Port 1 View  12/04/2013   CLINICAL DATA:  Pneumonia  EXAM: PORTABLE CHEST - 1 VIEW  COMPARISON:  Jan 02, 2014  FINDINGS: Progression of bibasilar infiltrates. Right upper lobe infiltrate also shows mild  progression. Findings could be due to pneumonia. Increase in small right effusion.  IMPRESSION: Progression of bilateral airspace disease, most consistent with pneumonia however pulmonary edema could have a similar appearance.   Electronically Signed   By: Franchot Gallo M.D.   On: 12/04/2013 07:01   ASSESSMENT / PLAN:  Acute on chronic respiratory failure  Oxygen to keep SpO2 > 92%.  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  Doree Fudge, MD Pulmonary and [place] Pager: [phone]  12/05/2013, 8:28 AM

## 2013-12-05 NOTE — Progress Notes (Signed)
PT Cancellation Note  Patient Details Name: CHERRI YERA MRN: [id number] DOB: [birth year]   Cancelled Treatment:    Reason Eval/Treat Not Completed: Medical issues which prohibited therapy--spoke with RN-patient's sats drop with any activity. Will check back on tomorrow to see if pt can participate with therapy. Thanks.    Weston Anna, MPT Pager: [phone]

## 2013-12-05 NOTE — Progress Notes (Signed)
Subjective: Patient claims that she is much improved compared to last 24 hours, decreased shortness of breath, decreased chest discomfort, appetite improving, denies any nausea, vomiting, chest pain or patient's. Getting a nebulizer treatment, alert and oriented, answering all questions appropriately  Objective: Vital signs in last 24 hours: Temp:  [97.3 F (36.3 C)-98.7 F (37.1 C)] 98 F (36.7 C) (04/25 1200) Pulse Rate:  [52-73] 73 (04/25 0938) Resp:  [13-28] 15 (04/25 0938) BP: (107-194)/(63-94) 169/86 mmHg (04/25 0925) SpO2:  [82 %-98 %] 89 % (04/25 0946) FiO2 (%):  [40 %-45 %] 40 % (04/25 0938) Weight:  [54.4 kg (119 lb 14.9 oz)] 54.4 kg (119 lb 14.9 oz) (04/25 0500) Weight change: -0.4 kg (-14.1 oz) Last BM Date: 12/01/13  CBG (last 3)   Recent Labs  12/04/13 2216 12/05/13 0751 12/05/13 1132  GLUCAP 98 118* 212*    Intake/Output from previous day: 04/24 0701 - 04/25 0700 In: 1020 [P.O.:380; I.V.:90; IV Piggyback:550] Out: 2525 [Urine:2525] Intake/Output this shift: Total I/O In: 100 [P.O.:100] Out: 300 [Urine:300]  Appearance-chronically ill appearing thin but appropriate, alert and oriented x3 answers questions quickly and appropriate Sclera anicteric extraconal movements are intact No oropharyngeal lesions, does use a nebulizer at this time Neck supple, no cervical adenopathy Vascular reveals regular rate and rhythm with no significant ectopy, no murmur Lungs are for the most part clear at this time, with minimal worse breath sounds possibly wheezing at the bases Abdomen soft, nontender, nondistended No peripheral edema pedal pulses intact no cyanosis RT x3    Lab Results:  Recent Labs  12/04/13 0312 12/05/13 0342  NA 138 142  K 5.1 3.9  CL 93* 96  CO2 36* 38*  GLUCOSE 138* 120*  BUN 23 34*  CREATININE 0.91 1.05  CALCIUM 9.6 9.1  MG  --  1.7  PHOS  --  3.9    Recent Labs  12/07/2013 1250 12/04/13 0312  AST 29 19  ALT 47* 38*  ALKPHOS  172* 150*  BILITOT 0.7 0.6  PROT 6.9 6.3  ALBUMIN 2.8* 2.5*    Recent Labs  11/20/2013 1250 12/04/13 0312 12/05/13 0342  WBC 18.2* 12.9* 13.1*  NEUTROABS 15.9*  --   --   HGB 15.0 13.5 13.4  HCT 47.4* 44.1 42.3  MCV 101.9* 101.6* 102.2*  PLT 366 300 310    Recent Labs  11/15/2013 1250  TROPONINI <0.30   No results found for this basename: TSH, T4TOTAL, FREET3, T3FREE, THYROIDAB,  in the last 72 hours No results found for this basename: VITAMINB12, FOLATE, FERRITIN, TIBC, IRON, RETICCTPCT,  in the last 72 hours  Studies/Results: Dg Chest 2 View  11/26/2013   CLINICAL DATA:  COPD, shortness of breath  EXAM: CHEST  2 VIEW  COMPARISON:  DG CHEST 2 VIEW dated 11/20/2013; DG CHEST 1V PORT dated 11/15/2013; DG CHEST 2 VIEW dated 12/31/2012  FINDINGS: There is hazy bibasilar airspace disease which is less pronounced compared with the prior exams likely rib reflecting resolving pneumonia. There is hazy right upper lobe airspace disease likely reflecting pneumonia. There is no pleural effusion or pneumothorax. Stable cardiomediastinal silhouette. There are multiple old right posterior rib deformities.  IMPRESSION: 1. Bibasilar hazy airspace disease improved compared with the prior exam. Hazy right upper lobe airspace disease more prominent compared with the prior exam. These findings are most compatible with persistent pneumonia.   Electronically Signed   By: Kathreen Devoid   On: 12/01/2013 13:39   Dg Chest Port 1 [address]  12/04/2013   CLINICAL DATA:  Pneumonia  EXAM: PORTABLE CHEST - 1 VIEW  COMPARISON:  2013/12/23  FINDINGS: Progression of bibasilar infiltrates. Right upper lobe infiltrate also shows mild progression. Findings could be due to pneumonia. Increase in small right effusion.  IMPRESSION: Progression of bilateral airspace disease, most consistent with pneumonia however pulmonary edema could have a similar appearance.   Electronically Signed   By: Franchot Gallo M.D.   On: 12/04/2013 07:01      Medications: Scheduled: . ALIGN  1 capsule Oral q morning - 10a  . amLODipine  2.5 mg Oral QHS  . antiseptic oral rinse  15 mL Mouth Rinse BID  . atorvastatin  40 mg Oral QPM  . azithromycin  500 mg Intravenous Q24H  . aztreonam  1 g Intravenous 3 times per day  . calcium carbonate  1 tablet Oral QHS  . DULoxetine  60 mg Oral q morning - 10a  . famotidine  20 mg Oral BID  . fentaNYL  75 mcg Transdermal QODAY  . ferrous sulfate  325 mg Oral TID WC  . gabapentin  300 mg Oral QHS  . guaiFENesin  1,200 mg Oral BID  . insulin aspart  0-5 Units Subcutaneous QHS  . insulin aspart  0-9 Units Subcutaneous TID WC  . ipratropium-albuterol  3 mL Nebulization Q4H  . methylPREDNISolone (SOLU-MEDROL) injection  40 mg Intravenous Q12H  . nadolol  20 mg Oral QPM  . oxybutynin  5 mg Oral Daily  . rivaroxaban  20 mg Oral QPM   Continuous:   Assessment/Plan: Principal Problem:   Acute and chronic respiratory failure-improving on oxygen therapy, will continue pulmonary toilet, bronchodilators, Atrovent, Solu-Medrol being tapered by pulmonology Active Problems:   Crohn's disease stable, no dominant pain or bleeding   Chronic pain syndrome-controlled per patient on multiple medical   Recurrent pulmonary embolism-on anticoagulate   COPD (chronic obstructive pulmonary disease) with emphysema-stable with clinical improvement   HCAP (healthcare-associated pneumonia) leukocytosis improved, afebrile   Osteoporosis with fracture   Severe protein-calorie malnutrition    LOS: 2 days   Katherine Mckay R Katherine Mckay 12/05/2013, 1:04 PM

## 2013-12-05 NOTE — Progress Notes (Signed)
eLink Physician-Brief Progress Note Patient Name: Katherine Mckay DOB: 27-Feb-1941 MRN: [id number]  Date of Service  12/05/2013   HPI/Events of Note  Patient with several elevated bp's this afternoon.  On nadolol and norvasc 2.5 mg at night.   eICU Interventions  Extra dose of norvasc 5 mg given now and regular bedtime dose increased to 5mg .        [name] 12/05/2013, 5:15 PM

## 2013-12-06 ENCOUNTER — Inpatient Hospital Stay (HOSPITAL_COMMUNITY): Payer: Medicare Other

## 2013-12-06 DIAGNOSIS — J471 Bronchiectasis with (acute) exacerbation: Secondary | ICD-10-CM

## 2013-12-06 LAB — CBC
HEMATOCRIT: 43.4 % (ref 36.0–46.0)
Hemoglobin: 13.5 g/dL (ref 12.0–15.0)
MCH: 32.1 pg (ref 26.0–34.0)
MCHC: 31.1 g/dL (ref 30.0–36.0)
MCV: 103.1 fL — AB (ref 78.0–100.0)
PLATELETS: 324 10*3/uL (ref 150–400)
RBC: 4.21 MIL/uL (ref 3.87–5.11)
RDW: 13.7 % (ref 11.5–15.5)
WBC: 11.2 10*3/uL — ABNORMAL HIGH (ref 4.0–10.5)

## 2013-12-06 LAB — BASIC METABOLIC PANEL
BUN: 31 mg/dL — AB (ref 6–23)
CALCIUM: 8.8 mg/dL (ref 8.4–10.5)
CO2: 37 meq/L — AB (ref 19–32)
CREATININE: 1.01 mg/dL (ref 0.50–1.10)
Chloride: 95 mEq/L — ABNORMAL LOW (ref 96–112)
GFR calc Af Amer: 64 mL/min — ABNORMAL LOW (ref >90)
GFR calc non Af Amer: 55 mL/min — ABNORMAL LOW (ref >90)
Glucose, Bld: 140 mg/dL — ABNORMAL HIGH (ref 70–99)
Potassium: 4.2 mEq/L (ref 3.7–5.3)
Sodium: 139 mEq/L (ref 137–147)

## 2013-12-06 LAB — GLUCOSE, CAPILLARY
GLUCOSE-CAPILLARY: 122 mg/dL — AB (ref 70–99)
GLUCOSE-CAPILLARY: 126 mg/dL — AB (ref 70–99)
GLUCOSE-CAPILLARY: 201 mg/dL — AB (ref 70–99)
Glucose-Capillary: 119 mg/dL — ABNORMAL HIGH (ref 70–99)
Glucose-Capillary: 113 mg/dL — ABNORMAL HIGH (ref 70–99)

## 2013-12-06 MED ORDER — SODIUM CHLORIDE 0.9 % IV SOLN
250.0000 mg | Freq: Four times a day (QID) | INTRAVENOUS | Status: DC
  Administered 2013-12-06 – 2013-12-13 (×29): 250 mg via INTRAVENOUS
  Filled 2013-12-06 (×32): qty 250

## 2013-12-06 MED ORDER — HYDRALAZINE HCL 20 MG/ML IJ SOLN
10.0000 mg | INTRAMUSCULAR | Status: DC | PRN
  Administered 2013-12-06: 20 mg via INTRAVENOUS
  Administered 2013-12-07: 10 mg via INTRAVENOUS
  Administered 2013-12-07 – 2013-12-08 (×2): 20 mg via INTRAVENOUS
  Filled 2013-12-06 (×4): qty 1

## 2013-12-06 MED ORDER — SODIUM CHLORIDE 3 % IN NEBU
5.0000 mL | INHALATION_SOLUTION | Freq: Two times a day (BID) | RESPIRATORY_TRACT | Status: DC | PRN
  Administered 2013-12-06: 5 mL via RESPIRATORY_TRACT
  Filled 2013-12-06: qty 8

## 2013-12-06 NOTE — Progress Notes (Signed)
Appetite is very poor.  Ate only a few bites at breakfast and skipped lunch.  Increased work and breathing and shob at meals observed.

## 2013-12-06 NOTE — Plan of Care (Signed)
Problem: Phase II Progression Outcomes Goal: Encourage coughing & deep breathing Outcome: Not Progressing Patient unable to take a deep breathe. Goal: Wean O2 if indicated Outcome: Not Progressing Unable to titrate O2 down due to O2 sats dropping below 88%. Goal: Pain controlled Outcome: Not Progressing Patient asking for increasing amounts of pain medication. Goal: Progress activity as tolerated unless otherwise ordered Outcome: Not Progressing Severe activity intolerance.  Up to chair today with two assist.  Patient had a difficult time recovering from shob and tachypnea.   Problem: Phase III Progression Outcomes Goal: Tolerating diet Outcome: Not Progressing Patient has ate very little.  She skipped lunch and dinner and only ate a few bites at breakfast. Fluids encouraged for continued hydration.

## 2013-12-06 NOTE — Progress Notes (Signed)
PULMONARY / CRITICAL CARE MEDICINE   Name: Katherine Mckay MRN: [id number] DOB: 11/08/1940    ADMISSION DATE:  11/18/2013  REFERRING MD :  ER  CHIEF COMPLAINT:  Short of breath  BRIEF PATIENT DESCRIPTION:  73 yo female with hypoxia, wheezing and pneumonia.  She has hx of COPD on steroids, bronchiectasis, and home oxygen.  She was recently treated for pneumonia.  SIGNIFICANT EVENTS: 4/23 Admit  STUDIES:  11/10/12 Sputum >> Pseudomonas (pan sensitive) 11/17/12 CT chest >> centrilobular emphysema, mild BTX 12/09/12 PFT >> FEV1 0.94 (51%), FEV1% 45, TLC 5.09 (115%), DLCO 41%  LINES / TUBES: PIV  CULTURES: Blood 4/23 >>  Sputum 4/25 > abundant GNR  ANTIBIOTICS: Zithromax 4/23 >> 4/26 Azactam 4/23 >>4/26 Vancomycin 4/23 >> 4/25 Imipenem 4/26 >   SUBJECTIVE: Feels about the same; yesterday was feeling pretty well until the evening. Feels like a weight is on her chest.   VITAL SIGNS: Temp:  [96.8 F (36 C)-98.3 F (36.8 C)] 97.8 F (36.6 C) (04/26 0800) Pulse Rate:  [58-76] 66 (04/26 0330) Resp:  [15-31] 18 (04/26 0330) BP: (141-219)/(76-99) 141/99 mmHg (04/26 0330) SpO2:  [82 %-95 %] 90 % (04/26 0330) FiO2 (%):  [40 %] 40 % (04/25 1922) Weight:  [58.8 kg (129 lb 10.1 oz)] 58.8 kg (129 lb 10.1 oz) (04/26 0100) INTAKE / OUTPUT: Intake/Output     04/25 0701 - 04/26 0700 04/26 0701 - 04/27 0700   P.O. 100    I.V. (mL/kg)     IV Piggyback 300    Total Intake(mL/kg) 400 (6.8)    Urine (mL/kg/hr) 2150 (1.5)    Total Output 2150     Net -1750            PHYSICAL EXAMINATION: General: lying in bed, severe kyphosis HEENT: NCAT PULM: few wheezes, rhonchi bilaterally CV: RRR, no mgr AB: BS+, soft, nontender Ext: warm, acyanotic Neuro: A&Ox4, maew  LABS:  CBC  Recent Labs Lab 12/04/13 0312 12/05/13 0342 12/06/13 0336  WBC 12.9* 13.1* 11.2*  HGB 13.5 13.4 13.5  HCT 44.1 42.3 43.4  PLT 300 310 324   Coag's  Recent Labs Lab 12/07/2013 1250  INR 1.07    BMET  Recent Labs Lab 12/04/13 0312 12/05/13 0342 12/06/13 0336  NA 138 142 139  K 5.1 3.9 4.2  CL 93* 96 95*  CO2 36* 38* 37*  BUN 23 34* 31*  CREATININE 0.91 1.05 1.01  GLUCOSE 138* 120* 140*   Electrolytes  Recent Labs Lab 12/04/13 0312 12/05/13 0342 12/06/13 0336  CALCIUM 9.6 9.1 8.8  MG  --  1.7  --   PHOS  --  3.9  --    Sepsis Markers No results found for this basename: LATICACIDVEN, PROCALCITON, O2SATVEN,  in the last 168 hours  ABG  Recent Labs Lab 11/19/2013 1452  PHART 7.409  PCO2ART 56.6*  PO2ART 53.3*   Liver Enzymes  Recent Labs Lab 11/27/2013 1250 12/04/13 0312  AST 29 19  ALT 47* 38*  ALKPHOS 172* 150*  BILITOT 0.7 0.6  ALBUMIN 2.8* 2.5*   Cardiac Enzymes  Recent Labs Lab 11/27/2013 1250  TROPONINI <0.30  PROBNP 2859.0*   Glucose  Recent Labs Lab 12/05/13 0751 12/05/13 1132 12/05/13 1744 12/05/13 2244 12/06/13 0610 12/06/13 0800  GLUCAP 118* 212* 189* 181* 122* 119*    Imaging 4/25 CXR > kyphosis, rotated, bilateral airspace disease in bases, left > right  ASSESSMENT / PLAN:  PULMONARY A: Acute on chronic respiratory failure  2nd to HCAP, AECOPD, bronchiectasis flare.  Has new bibasilar infiltrates compared to baseline chest x-ray consistent with HCAP.  Really has not made any improvement so far; suspect mucus plugging given thick mucus production and bronchiectasis; Growing GNR after multiple doses of antibiotics> is this a resistant organism?  P:   - Oxygen to keep SpO2 > 88%. - IV solumedrol as ordered. - Duoneb q4h + prn albuterol - 4/26: change gram negative coverage, focus on mucociliary clearance> out of bed add hypertonic saline; if no improvement then consider broaden antibiotics  - Bronchial hygiene. - IS and flutter valve. - hold further lasix (can thicken mucus, doubt pulm edema)  CARDIOVASCULAR A:  Hx of HTN, hyperlipidemia. Hx of PE on chronic rivaroxaban as outpt Doubt pulm edema P:  - Continue  current treatment. - increased amlodipine - added prn hydralazine  RENAL A:   No acute issues P:   - hold lasix - daily BMET  GASTROINTESTINAL A:   Nutrition. Protein calorie malnutrition. Hx of Crohn's diseasee. P:   - Per primary team.  HEMATOLOGIC A:   Leukocytosis. P:  - F/u CBC.  INFECTIOUS A:   HCAP >> prior hx of Pseudomonas in sputum, and on chronic prednisone.  Allergies to PCN.  P:   - change aztreonam to imipenem (discussed allergy risk with pharmacy> minimal) - d/c  (but outpatient daily azithro would do her good to prevent flares of bronchiectasis) - F/u cx results.  ENDOCRINE A:   Steroid induced hyperglycemia.  Hx of Osteoporosis.  P:   - Per primary team.  NEUROLOGIC A:   Hx of Anxiety, depression. Chronic neuropathic pain. P:   - Pain medications per primary team.  Roselie Awkward, MD [facility] Pager: [phone] Cell: [phone] If no response, call [phone]

## 2013-12-06 NOTE — Progress Notes (Signed)
Subjective: Appreciate input of critical care medicine, monitoring pulmonary and cardiovascular status, noted gram-negative rods in sputum, antibiotic changes made, pulmonary situation possibly worsened with worsening hypoxia, oxygen increased, labile blood pressure possibly complicated by anxiety blood pressure medications added by critical care medicine. Patient in reasonably good spirits this morning, answering questions appropriately somewhat anxious, also answered multiple questions from husband given pulmonary status, possible in our mental etiologies to her current condition. Pain well-controlled at this time decreased chest discomfort, appetite improving, denies any nausea, vomiting, chest pain or patient's. Getting a nebulizer treatment, alert and oriented, answering all questions appropriately  Objective: Vital signs in last 24 hours: Temp:  [96.8 F (36 C)-98.3 F (36.8 C)] 97.8 F (36.6 C) (04/26 0800) Pulse Rate:  [58-76] 66 (04/26 0330) Resp:  [17-31] 18 (04/26 0330) BP: (141-219)/(76-99) 141/99 mmHg (04/26 0330) SpO2:  [86 %-93 %] 93 % (04/26 0940) FiO2 (%):  [40 %-55 %] 55 % (04/26 0940) Weight:  [58.8 kg (129 lb 10.1 oz)] 58.8 kg (129 lb 10.1 oz) (04/26 0100) Weight change: 4.4 kg (9 lb 11.2 oz) Last BM Date: 12/01/13  CBG (last 3)   Recent Labs  12/05/13 2244 12/06/13 0610 12/06/13 0800  GLUCAP 181* 122* 119*    Intake/Output from previous day: 04/25 0701 - 04/26 0700 In: 400 [P.O.:100; IV Piggyback:300] Out: 2150 [Urine:2150] Intake/Output this shift:    Appearance-chronically ill appearing thin but appropriate, alert and oriented x3 answers questions quickly and appropriate, face mask in place Sclera anicteric extraconal movements are intact No oropharyngeal lesions, does use a nebulizer at this time Neck supple, no cervical adenopathy Vascular reveals regular rate and rhythm with no significant ectopy, no murmur Lungs are for the most part clear at this  time, with minimal worse breath sounds possibly wheezing at the bases Abdomen soft, nontender, nondistended No peripheral edema pedal pulses intact no cyanosis Oriented x3    Lab Results:  Recent Labs  12/04/13 0312 12/05/13 0342 12/06/13 0336  NA 138 142 139  K 5.1 3.9 4.2  CL 93* 96 95*  CO2 36* 38* 37*  GLUCOSE 138* 120* 140*  BUN 23 34* 31*  CREATININE 0.91 1.05 1.01  CALCIUM 9.6 9.1 8.8  MG  --  1.7  --   PHOS  --  3.9  --     Recent Labs  Dec 27, 2013 1250 12/04/13 0312  AST 29 19  ALT 47* 38*  ALKPHOS 172* 150*  BILITOT 0.7 0.6  PROT 6.9 6.3  ALBUMIN 2.8* 2.5*    Recent Labs  Dec 27, 2013 1250  12/05/13 0342 12/06/13 0336  WBC 18.2*  < > 13.1* 11.2*  NEUTROABS 15.9*  --   --   --   HGB 15.0  < > 13.4 13.5  HCT 47.4*  < > 42.3 43.4  MCV 101.9*  < > 102.2* 103.1*  PLT 366  < > 310 324  < > = values in this interval not displayed.  Recent Labs  Dec 27, 2013 1250  TROPONINI <0.30   No results found for this basename: TSH, T4TOTAL, FREET3, T3FREE, THYROIDAB,  in the last 72 hours No results found for this basename: VITAMINB12, FOLATE, FERRITIN, TIBC, IRON, RETICCTPCT,  in the last 72 hours  Studies/Results: Dg Chest Port 1 View  12/06/2013   CLINICAL DATA:  COPD, evaluate for infiltrates  EXAM: PORTABLE CHEST - 1 VIEW  COMPARISON:  Prior chest x-ray 12/04/2013  FINDINGS: Patient is rotated to the right. Stable cardiomegaly and mediastinal contours given rotation.  Linear artifact overlying the right chest favored to represent skin folds and/or overlying bedding no definite pneumothorax. Severe background chronic pulmonary disease with bronchitic changes and diffuse interstitial prominence. Additionally there appears to be Advanced centrilobular emphysema. Patchy opacity noted in the left lung base. Additional atelectatic changes noted in both lower lobes.  IMPRESSION: 1. Patchy left lower lobe opacity concerning for pneumonia. 2. Background changes of severe COPD and  emphysema. 3. Bibasilar atelectasis.   Electronically Signed   By: Jacqulynn Cadet M.D.   On: 12/06/2013 07:33     Medications: Scheduled: . ALIGN  1 capsule Oral q morning - 10a  . amLODipine  5 mg Oral QHS  . antiseptic oral rinse  15 mL Mouth Rinse BID  . atorvastatin  40 mg Oral QPM  . calcium carbonate  1 tablet Oral QHS  . DULoxetine  60 mg Oral q morning - 10a  . famotidine  20 mg Oral BID  . fentaNYL  75 mcg Transdermal QODAY  . ferrous sulfate  325 mg Oral TID WC  . gabapentin  300 mg Oral QHS  . guaiFENesin  1,200 mg Oral BID  . imipenem-cilastatin  250 mg Intravenous Q6H  . insulin aspart  0-5 Units Subcutaneous QHS  . insulin aspart  0-9 Units Subcutaneous TID WC  . ipratropium-albuterol  3 mL Nebulization Q4H  . methylPREDNISolone (SOLU-MEDROL) injection  40 mg Intravenous Q12H  . nadolol  20 mg Oral QPM  . oxybutynin  5 mg Oral Daily  . 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

## 2013-12-06 NOTE — Progress Notes (Signed)
Patient demonstrated severe activity intolerance when getting up to chair and returning to bed with 2 assist.When back in bed O2 sats 85-90 % on 55% venturi mask.  Sats recovered to 89-90% quickly, but the patient remained shob with tachypnea for over 15 minutes.  No cyanosis observed and heart rate remained 70-85 bpm the entire time. The patient is not resting with no signs of distress.

## 2013-12-06 NOTE — Progress Notes (Addendum)
ANTIBIOTIC CONSULT NOTE - FOLLOW UP  Pharmacy Consult for aztreonam --> imipenem Indication: pneumonia  Allergies  Allergen Reactions  . Celebrex [Celecoxib] Shortness Of Breath    No voiding  . Penicillins Shortness Of Breath and Rash  . Smz-Tmp Ds [Sulfamethoxazole W/Trimethoprim (Co-Trimoxazole)] Rash  . Ceclor [Cefaclor] Other (See Comments)    Stomach pain  . Ciprofloxacin Hcl     Possible GI Bleed.  . Diclofenac Sodium     Pt doesn't remember reaction  . Doxycycline     Stomach pains.  . Flagyl [Metronidazole Hcl]     Vaginal rash  . Hydrocodone Other (See Comments)    Severe stomach pain  . Iohexol      Code: HIVES, Desc: pt states she breaks out in hives and sneezes 10/01/08   Desc: STATES SHE ONLY REACTS BY SNEEZING   . Other Other (See Comments)    Darvocet - liver damage  . Percocet [Oxycodone-Acetaminophen] Other (See Comments)    Liver damage  . Prilosec [Omeprazole] Other (See Comments)    Causes ulcers in stomach and esophagus  . Tylenol [Acetaminophen] Other (See Comments)    Liver damage    Patient Measurements: Height: 5\' 3"  (160 cm) Weight: 129 lb 10.1 oz (58.8 kg) IBW/kg (Calculated) : 52.4 Adjusted Body Weight:   Vital Signs: Temp: 97.8 F (36.6 C) (04/26 0800) Temp src: Oral (04/26 0800) BP: 141/99 mmHg (04/26 0330) Pulse Rate: 66 (04/26 0330) Intake/Output from previous day: 04/25 0701 - 04/26 0700 In: 400 [P.O.:100; IV Piggyback:300] Out: 2150 [Urine:2150] Intake/Output from this shift:    Labs:  Recent Labs  12/04/13 0312 12/05/13 0342 12/06/13 0336  WBC 12.9* 13.1* 11.2*  HGB 13.5 13.4 13.5  PLT 300 310 324  CREATININE 0.91 1.05 1.01   Estimated Creatinine Clearance: 42.9 ml/min (by C-G formula based on Cr of 1.01). No results found for this basename: VANCOTROUGH, VANCOPEAK, VANCORANDOM, [place], GENTPEAK, GENTRANDOM, TOBRATROUGH, TOBRAPEAK, TOBRARND, AMIKACINPEAK, AMIKACINTROU, AMIKACIN,  in the last 72 hours    Microbiology: Recent Results (from the past 720 hour(s))  CULTURE, BLOOD (ROUTINE X 2)     Status: None   Collection Time    12/02/2013  4:30 PM      Result Value Ref Range Status   Specimen Description BLOOD RIGHT ANTECUBITAL   Final   Special Requests BOTTLES DRAWN AEROBIC AND ANAEROBIC 4CC   Final   Culture  Setup Time     Final   Value: 12/02/2013 21:54     Performed at [facility]   Culture     Final   Value:        BLOOD CULTURE RECEIVED NO GROWTH TO DATE CULTURE WILL BE HELD FOR 5 DAYS BEFORE ISSUING A FINAL NEGATIVE REPORT     Performed at [facility]   Report Status PENDING   Incomplete  CULTURE, BLOOD (ROUTINE X 2)     Status: None   Collection Time    11/14/2013  4:36 PM      Result Value Ref Range Status   Specimen Description BLOOD RIGHT HAND   Final   Special Requests BOTTLES DRAWN AEROBIC ONLY 3CC   Final   Culture  Setup Time     Final   Value: 11/29/2013 21:55     Performed at [facility]   Culture     Final   Value:        BLOOD CULTURE RECEIVED NO GROWTH TO DATE CULTURE WILL BE HELD FOR  5 DAYS BEFORE ISSUING A FINAL NEGATIVE REPORT     Performed at [facility]   Report Status PENDING   Incomplete  MRSA PCR SCREENING     Status: None   Collection Time    11/24/2013  5:25 PM      Result Value Ref Range Status   MRSA by PCR NEGATIVE  NEGATIVE Final   Comment:            The GeneXpert MRSA Assay (FDA     approved for NASAL specimens     only), is one component of a     comprehensive MRSA colonization     surveillance program. It is not     intended to diagnose MRSA     infection nor to guide or     monitor treatment for     MRSA infections.  CULTURE, EXPECTORATED SPUTUM-ASSESSMENT     Status: None   Collection Time    12/04/13  1:44 PM      Result Value Ref Range Status   Specimen Description SPUTUM   Final   Special Requests NONE   Final   Sputum evaluation     Final   Value: THIS SPECIMEN IS ACCEPTABLE. RESPIRATORY  CULTURE REPORT TO FOLLOW.   Report Status 12/04/2013 FINAL   Final  CULTURE, RESPIRATORY (NON-EXPECTORATED)     Status: None   Collection Time    12/04/13  1:44 PM      Result Value Ref Range Status   Specimen Description SPUTUM   Final   Special Requests NONE   Final   Gram Stain     Final   Value: NO WBC SEEN     NO SQUAMOUS EPITHELIAL CELLS SEEN     ABUNDANT GRAM NEGATIVE RODS     Performed at [facility]   Culture     Final   Value: MODERATE GRAM NEGATIVE RODS     Performed at [facility]   Report Status PENDING   Incomplete    Anti-infectives   Start     Dose/Rate Route Frequency Ordered Stop   12/04/13 1000  vancomycin (VANCOCIN) 500 mg in sodium chloride 0.9 % 100 mL IVPB  Status:  Discontinued     500 mg 100 mL/hr over 60 Minutes Intravenous Every 12 hours 12/04/2013 1746 12/05/13 0843   12/04/13 0000  aztreonam (AZACTAM) 1 g in dextrose 5 % 50 mL IVPB     1 g 100 mL/hr over 30 Minutes Intravenous 3 times per day 11/25/2013 1745     12/07/2013 1830  vancomycin (VANCOCIN) IVPB 750 mg/150 ml premix     750 mg 150 mL/hr over 60 Minutes Intravenous  Once 12/02/2013 1745 11/28/2013 2246   12/07/2013 1800  azithromycin (ZITHROMAX) 500 mg in dextrose 5 % 250 mL IVPB     500 mg 250 mL/hr over 60 Minutes Intravenous Every 24 hours 11/30/2013 1744     12/01/2013 1545  vancomycin (VANCOCIN) IVPB 1000 mg/200 mL premix  Status:  Discontinued     1,000 mg 200 mL/hr over 60 Minutes Intravenous  Once 11/19/2013 1540 11/11/2013 1745   12/02/2013 1545  azithromycin (ZITHROMAX) 500 mg in dextrose 5 % 250 mL IVPB  Status:  Discontinued     500 mg 250 mL/hr over 60 Minutes Intravenous  Once 11/27/2013 1540 11/16/2013 1743   11/11/2013 1545  aztreonam (AZACTAM) 2 g in dextrose 5 % 50 mL IVPB     2 g 100 mL/hr over  30 Minutes Intravenous  Once 12/10/13 1540 2013-12-10 1719      Assessment: 73 yof with steroid-dependent COPD, recently admitted 4/5-4/11 for PNA (tx with levaquin) presents 4/23 with  CXR shows persistent PNA. Of note, sputum culture from March 2014 grew pseudomonas sensitive to all agents tested.  4/23 >> Vanc >> 4/25 4/23 >> Aztreonam >> 4/26 4/23 >> Zithromax >> 4/26 4/26 >> imipenem >>  Tmax: afeb WBCs: 11.2 (steroids) Renal: SCr 1.01 CrCl 76ml/min (N), 32ml/min (CG)  11/10/12 Sputum: pan-susceptible P. aeruginosa 4/23 blood x2: NGTD 4/24 Sputum: mod GNR  Goal of Therapy:  Dose appropriately for renal function  Plan:   Vancomycin stopped 4/25  Continue aztreonam 1gm IV q8h   Pharmacy to follow at distance as no further dosing adjustments needed at this time, will intervene if change in renal function necessitates dose change  Osa Craver Zeigler 12/06/2013,8:46 AM  Addendum: New orders to stop aztreonam and azithromycin and start imipenem  Plan:   Primaxin 250mg  IV q6h  Pharmacy to write note/adjust dose if change in renal function occurs  Thanks, Doreene Eland, PharmD, BCPS.   Pager: [phone] 12/06/2013 9:23 AM

## 2013-12-06 NOTE — Evaluation (Addendum)
Physical Therapy Evaluation Patient Details Name: Katherine Mckay MRN: [id number] DOB: 22-Sep-1940 Today's Date: 12/06/2013   History of Present Illness  Pt admitted with Acute and chronic respiratory failure  Clinical Impression  *Pt admitted with respiratory failure*. Pt currently with functional limitations due to the deficits listed below (see PT Problem List).  Pt will benefit from skilled PT to increase their independence and safety with mobility to allow discharge to the venue listed below.   Pt transferred bed to recliner with +2 mod assist, SaO2 dropped to 85% on 15L O2 venturi mask. HHPT recommended.   **    Follow Up Recommendations Home health PT    Equipment Recommendations  None recommended by PT    Recommendations for Other Services       Precautions / Restrictions Precautions Precautions: Fall Precaution Comments: monitor sats, O2 dependent Restrictions Weight Bearing Restrictions: No      Mobility  Bed Mobility Overal bed mobility: Needs Assistance Bed Mobility: Supine to Sit     Supine to sit: Min assist     General bed mobility comments: assist to raise trunk  Transfers Overall transfer level: Needs assistance Equipment used: Rolling walker (2 wheeled) Transfers: Sit to/from Stand Sit to Stand: +2 physical assistance;+2 safety/equipment;Mod assist Stand pivot transfers: Mod assist;+2 safety/equipment;+2 physical assistance       General transfer comment: Bed to recliner transfer with RW  Ambulation/Gait                Stairs            Wheelchair Mobility    Modified Rankin (Stroke Patients Only)       Balance Overall balance assessment: No apparent balance deficits (not formally assessed)                                           Pertinent Vitals/Pain *SaO2 94% on 15 L O2 venturi mask at rest, dropped to 85% with activity on 15L venturi mask **    Home Living Family/patient expects to be  discharged to:: Private residence Living Arrangements: Spouse/significant other Available Help at Discharge: Family Type of Home: House Home Access: Ramped entrance;Stairs to enter   Entrance Stairs-Number of Steps: 1 Home Layout: One level Home Equipment: Environmental consultant - 2 wheels;Wheelchair - manual;Shower seat - built in;Shower seat;Bedside commode;Grab bars - toilet;Grab bars - tub/shower;Hand held shower head      Prior Function Level of Independence: Independent with assistive device(s)         Comments: helps with all laundry and cooking/cleaning     Hand Dominance   Dominant Hand: Right    Extremity/Trunk Assessment   Upper Extremity Assessment: Generalized weakness           Lower Extremity Assessment: Generalized weakness      Cervical / Trunk Assessment: Kyphotic  Communication   Communication: No difficulties  Cognition Arousal/Alertness: Awake/alert Behavior During Therapy: WFL for tasks assessed/performed Overall Cognitive Status: Within Functional Limits for tasks assessed                      General Comments      Exercises  Instructed pt in ankle pump, quad sets, and hip ABDuction AROM for independent exercise.        Assessment/Plan    PT Assessment Patient needs continued PT services  PT Diagnosis Difficulty walking  PT Problem List Decreased strength;Decreased activity tolerance;Cardiopulmonary status limiting activity;Pain  PT Treatment Interventions DME instruction;Gait training;Functional mobility training;Therapeutic activities;Therapeutic exercise;Patient/family education   PT Goals (Current goals can be found in the Care Plan section) Acute Rehab PT Goals Patient Stated Goal: home PT Goal Formulation: With patient/family Time For Goal Achievement: 12/20/13 Potential to Achieve Goals: Good    Frequency Min 3X/week   Barriers to discharge        Co-evaluation               End of Session Equipment Utilized  During Treatment: Gait belt;Oxygen Activity Tolerance: Treatment limited secondary to medical complications (Comment) (SaO2 85% on 15L venturimask) Patient left: in chair;with call bell/phone within reach;with family/visitor present Nurse Communication: Mobility status         Time: 1210-1225 PT Time Calculation (min): 15 min   Charges:   PT Evaluation $Initial PT Evaluation Tier I: 1 Procedure PT Treatments $Therapeutic Activity: 8-22 mins   PT G Codes:          Lucile Crater 12/06/2013, 1:10 PM [phone]

## 2013-12-07 LAB — BASIC METABOLIC PANEL
BUN: 30 mg/dL — ABNORMAL HIGH (ref 6–23)
CALCIUM: 9.1 mg/dL (ref 8.4–10.5)
CHLORIDE: 95 meq/L — AB (ref 96–112)
CO2: 35 mEq/L — ABNORMAL HIGH (ref 19–32)
Creatinine, Ser: 0.86 mg/dL (ref 0.50–1.10)
GFR calc Af Amer: 78 mL/min — ABNORMAL LOW (ref >90)
GFR calc non Af Amer: 67 mL/min — ABNORMAL LOW (ref >90)
Glucose, Bld: 105 mg/dL — ABNORMAL HIGH (ref 70–99)
Potassium: 4.3 mEq/L (ref 3.7–5.3)
Sodium: 139 mEq/L (ref 137–147)

## 2013-12-07 LAB — GLUCOSE, CAPILLARY
GLUCOSE-CAPILLARY: 95 mg/dL (ref 70–99)
GLUCOSE-CAPILLARY: 114 mg/dL — AB (ref 70–99)
Glucose-Capillary: 102 mg/dL — ABNORMAL HIGH (ref 70–99)

## 2013-12-07 LAB — CBC
HCT: 44 % (ref 36.0–46.0)
Hemoglobin: 13.9 g/dL (ref 12.0–15.0)
MCH: 32.1 pg (ref 26.0–34.0)
MCHC: 31.6 g/dL (ref 30.0–36.0)
MCV: 101.6 fL — AB (ref 78.0–100.0)
PLATELETS: 280 10*3/uL (ref 150–400)
RBC: 4.33 MIL/uL (ref 3.87–5.11)
RDW: 13.6 % (ref 11.5–15.5)
WBC: 9.7 10*3/uL (ref 4.0–10.5)

## 2013-12-07 MED ORDER — BUDESONIDE 0.25 MG/2ML IN SUSP
0.2500 mg | Freq: Four times a day (QID) | RESPIRATORY_TRACT | Status: DC
  Administered 2013-12-07 – 2013-12-09 (×8): 0.25 mg via RESPIRATORY_TRACT
  Filled 2013-12-07 (×16): qty 2

## 2013-12-07 MED ORDER — PREDNISONE 10 MG PO TABS
10.0000 mg | ORAL_TABLET | Freq: Every day | ORAL | Status: DC
  Administered 2013-12-08 – 2013-12-13 (×5): 10 mg via ORAL
  Filled 2013-12-07 (×8): qty 1

## 2013-12-07 MED ORDER — IPRATROPIUM-ALBUTEROL 0.5-2.5 (3) MG/3ML IN SOLN
3.0000 mL | Freq: Four times a day (QID) | RESPIRATORY_TRACT | Status: DC
  Administered 2013-12-07 – 2013-12-14 (×26): 3 mL via RESPIRATORY_TRACT
  Filled 2013-12-07 (×27): qty 3

## 2013-12-07 NOTE — Progress Notes (Signed)
Patients peripheral IV will outdate tonight at midnight.  Pt is a very difficult stick, this RN called IV team to place new IV.  IV team RN agreed that access is extremely limited.  Current IV is working well, no redness, swelling or c/o pain to site.  Called Dr. Forde Dandy, received order to keep current IV in for another 24 hours and re-evaluate daily.  Marnee Guarneri, RN

## 2013-12-07 NOTE — Progress Notes (Signed)
PULMONARY / CRITICAL CARE MEDICINE   Name: Katherine Mckay MRN: [id number] DOB: 1941-02-06    ADMISSION DATE:  12/05/2013  REFERRING MD :  ER  CHIEF COMPLAINT:  Short of breath  BRIEF PATIENT DESCRIPTION:  73 yo female with hypoxia, wheezing and pneumonia.  She has hx of COPD on chronic steroids (pred 10 mg/day), bronchiectasis, and home oxygen.  She was recently treated for pneumonia. 11/10/12 Sputum >> Pseudomonas (pan sensitive) 11/17/12 CT chest >> centrilobular emphysema, mild BTX 12/09/12 PFT >> FEV1 0.94 (51%), FEV1% 45, TLC 5.09 (115%), DLCO 41%  SIGNIFICANT EVENTS: 4/23 Admit  STUDIES:    LINES / TUBES: PIV  CULTURES: Blood 4/23 >>  Sputum 4/25 > abundant GNR>>  ANTIBIOTICS: Zithromax 4/23 >> 4/26 Azactam 4/23 >>4/26 Vancomycin 4/23 >> 4/25 Imipenem 4/26 >   SUBJECTIVE: Frail and weak. On 55% fio2 with sats 88-94%. Congested non productive cough.  VITAL SIGNS: Temp:  [97.2 F (36.2 C)-98.4 F (36.9 C)] 97.2 F (36.2 C) (04/27 0830) Pulse Rate:  [58-83] 83 (04/27 0830) Resp:  [15-25] 21 (04/27 0830) BP: (115-230)/(67-128) 120/79 mmHg (04/27 0830) SpO2:  [85 %-98 %] 89 % (04/27 0830) FiO2 (%):  [55 %-60 %] 55 % (04/27 0734) Weight:  [55.1 kg (121 lb 7.6 oz)] 55.1 kg (121 lb 7.6 oz) (04/27 0414) INTAKE / OUTPUT: Intake/Output     04/26 0701 - 04/27 0700 04/27 0701 - 04/28 0700   P.O. 390    I.V. (mL/kg) 100 (1.8)    IV Piggyback 200    Total Intake(mL/kg) 690 (12.5)    Urine (mL/kg/hr) 650 (0.5)    Total Output 650     Net +40          Stool Occurrence       PHYSICAL EXAMINATION: General: Frail, weak, no acute resp distress HEENT: WNL PULM: diminished throughout, no wheezes noted CV: RRR s M AB: BS+, soft, nontender Ext: warm, trace symmetric edema Neuro: No focal deficits, diffusely weak  LABS: I have reviewed all of today's lab results. Relevant abnormalities are discussed in the A/P section  CXR: NNF  ASSESSMENT /  PLAN:  PULMONARY A: Acute on chronic respiratory failure - multifactorial   -Suspected HCAP  -AECOPD, wheezing has resolved  -bronchiectasis flare  -Possible mucus plugging  P:   Change systemic steroids back to baseline dose Cont nebulized BDs Add nebulized steroids Cont abx   CARDIOVASCULAR A:  Hx of HTN, hyperlipidemia. Hx of PE on chronic rivaroxaban as outpt P:  Continue current treatment.  RENAL A:   No acute issues P:   Monitor BMET intermittently Monitor I/Os Correct electrolytes as indicated  GASTROINTESTINAL A:   Protein calorie malnutrition Hx of Crohn's disease Chronic H2RB therapy P:   SUP: PO famotidine Cont PO nutrition  HEMATOLOGIC A:   H?O VTE P:  DVT px: full anticoagulation (Rivaroxaban) Monitor CBC intermittently Transfuse per usual ICU guidelines  INFECTIOUS A:   GNR HCAP Hx of Pseudomonas Chronic steroids P:   Micro and abx as above  ENDOCRINE A:   Steroid induced hyperglycemia, resolved  No prior hx of DM Hx of Osteoporosis.  P:   Cont systemic steroids @ chronic dose DC SSI Cont CBGs qAC - resume insuling coverage for glu > 180  NEUROLOGIC A:   Hx of Anxiety, depression. Chronic neuropathic pain. P:   - Pain medications per primary team.  Richardson Landry Minor ACNP Maryanna Shape PCCM Pager [phone] till 3 pm If no answer page [phone] 12/07/2013,  9:32 AM   PCCM ATTENDING: I have interviewed and examined the patient and reviewed the database. I have formulated the assessment and plan as reflected in the note above with amendments made by me.   Merton Border, MD;  PCCM service; Mobile [phone]

## 2013-12-07 NOTE — Progress Notes (Signed)
CARE MANAGEMENT NOTE 12/07/2013  Patient:  Katherine Mckay, Katherine Mckay   Account Number:  [id number]  Date Initiated:  12/04/2013  Documentation initiated by:  DAVIS,RHONDA  Subjective/Objective Assessment:   AECOPD     Action/Plan:   home when resolved; and back to base line  pt is active with [facility] home [facility]   Anticipated DC Date:  12/10/2013   Anticipated DC Plan:  [place]  In-house referral  NA      DC Planning Services  CM consult      [facility] Choice  HOME HEALTH   Choice offered to / List presented to:  NA   DME arranged  NA      DME agency  NA     [place] arranged  NA      [place].   Status of service:  In process, will continue to follow Medicare Important Message given?  NA - LOS <3 / Initial given by admissions (If response is "NO", the following Medicare IM given date fields will be blank) Date Medicare IM given:   Date Additional Medicare IM given:    Discharge Disposition:    Per UR Regulation:  Reviewed for med. necessity/level of care/duration of stay  If discussed at [place] of Stay Meetings, dates discussed:    Comments:  [id number]/Rhonda Rosana Hoes RN, BSN, [place], [phone] Chart reviewed for update of needs and conResp, status still required Fi02 of 60%, wheezing, iv abx changed,iv steriods and broncholdialators continue. cxr - unchanged, does have recent hx of admission for PNA   [id number]/Rhonda Eldridge Dace, [place], [place] [phone] Chart Reviewed for discharge and hospital needs. Discharge needs at time of review: None present will follow for needs. Review of patient progress due on [id number].

## 2013-12-07 NOTE — Progress Notes (Signed)
Subjective: Used BIPAP all night. Did fair. Not eating well. Some abd cramping   Objective: Vital signs in last 24 hours: Temp:  [97.2 F (36.2 C)-98.4 F (36.9 C)] 97.2 F (36.2 C) (04/27 0414) Pulse Rate:  [58-83] 65 (04/27 0649) Resp:  [15-25] 16 (04/27 0649) BP: (115-230)/(67-128) 230/115 mmHg (04/27 0700) SpO2:  [84 %-98 %] 98 % (04/27 0734) FiO2 (%):  [55 %-60 %] 55 % (04/27 0734) Weight:  [55.1 kg (121 lb 7.6 oz)] 55.1 kg (121 lb 7.6 oz) (04/27 0414)  Intake/Output from previous day: 04/26 0701 - 04/27 0700 In: 690 [P.O.:390; I.V.:100; IV Piggyback:200] Out: 650 [Urine:650] Intake/Output this shift:    Frail with VM in place, lungs distant. Min wheeze, increased work of breathing. Ht tachy distant. abd distended. extrems thin some edema. Awake, mentating  Lab Results   Recent Labs  12/06/13 0336 12/07/13 0326  WBC 11.2* 9.7  RBC 4.21 4.33  HGB 13.5 13.9  HCT 43.4 44.0  MCV 103.1* 101.6*  MCH 32.1 32.1  RDW 13.7 13.6  PLT 324 280    Recent Labs  12/06/13 0336 12/07/13 0326  NA 139 139  K 4.2 4.3  CL 95* 95*  CO2 37* 35*  GLUCOSE 140* 105*  BUN 31* 30*  CREATININE 1.01 0.86  CALCIUM 8.8 9.1    Studies/Results: Dg Chest Port 1 View  12/06/2013   CLINICAL DATA:  COPD, evaluate for infiltrates  EXAM: PORTABLE CHEST - 1 VIEW  COMPARISON:  Prior chest x-ray 12/04/2013  FINDINGS: Patient is rotated to the right. Stable cardiomegaly and mediastinal contours given rotation. Linear artifact overlying the right chest favored to represent skin folds and/or overlying bedding no definite pneumothorax. Severe background chronic pulmonary disease with bronchitic changes and diffuse interstitial prominence. Additionally there appears to be Advanced centrilobular emphysema. Patchy opacity noted in the left lung base. Additional atelectatic changes noted in both lower lobes.  IMPRESSION: 1. Patchy left lower lobe opacity concerning for pneumonia. 2. Background changes of  severe COPD and emphysema. 3. Bibasilar atelectasis.   Electronically Signed   By: Jacqulynn Cadet M.D.   On: 12/06/2013 07:33    Scheduled Meds: . ALIGN  1 capsule Oral q morning - 10a  . amLODipine  5 mg Oral QHS  . antiseptic oral rinse  15 mL Mouth Rinse BID  . atorvastatin  40 mg Oral QPM  . calcium carbonate  1 tablet Oral QHS  . DULoxetine  60 mg Oral q morning - 10a  . famotidine  20 mg Oral BID  . fentaNYL  75 mcg Transdermal QODAY  . ferrous sulfate  325 mg Oral TID WC  . gabapentin  300 mg Oral QHS  . guaiFENesin  1,200 mg Oral BID  . imipenem-cilastatin  250 mg Intravenous Q6H  . insulin aspart  0-5 Units Subcutaneous QHS  . insulin aspart  0-9 Units Subcutaneous TID WC  . ipratropium-albuterol  3 mL Nebulization Q4H  . methylPREDNISolone (SOLU-MEDROL) injection  40 mg Intravenous Q12H  . nadolol  20 mg Oral QPM  . oxybutynin  5 mg Oral Daily  . rivaroxaban  20 mg Oral QPM   Continuous Infusions:  PRN Meds:albuterol, ALPRAZolam, hydrALAZINE, HYDROmorphone (DILAUDID) injection, oxyCODONE-acetaminophen, polyethylene glycol, promethazine, sodium chloride HYPERTONIC, traMADol  Assessment/Plan:  Acute and chronic respiratory failure: still frail. GNR in sputum noted.  Crohn's disease: Doing OK Chronic pain syndrome: She is maintained on many medications with fair control  Recurrent pulmonary embolism: She is on anticoagulation  COPD (chronic obstructive pulmonary disease) with emphysema: As above  HCAP (healthcare-associated pneumonia): now on extended Abx spectrum Osteoporosis with fracture: better with increased Rx  Severe protein-calorie malnutrition: Difficult to reverse, not eating much  Impaired glucose tolerance with stress hyperglycemia: BS's fair  Chronic adrenal insufficiency: She will remain on steroids  Hypertension: Doing a bit better  Cranial neuralgia: Pain is under control  Anxiety and depression: Remain on medications  Unstable gait: New data     LOS: 4 days   [place] 12/07/2013, 7:53 AM

## 2013-12-07 NOTE — Plan of Care (Signed)
Problem: Phase II Progression Outcomes Goal: Wean O2 if indicated Outcome: Progressing Unable to wean form 55% Venturi mask.

## 2013-12-08 ENCOUNTER — Encounter (HOSPITAL_COMMUNITY): Payer: Self-pay | Admitting: *Deleted

## 2013-12-08 ENCOUNTER — Inpatient Hospital Stay: Payer: Medicare Other | Admitting: Adult Health

## 2013-12-08 LAB — CULTURE, RESPIRATORY W GRAM STAIN: Gram Stain: NONE SEEN

## 2013-12-08 LAB — CULTURE, RESPIRATORY

## 2013-12-08 LAB — GLUCOSE, CAPILLARY
Glucose-Capillary: 108 mg/dL — ABNORMAL HIGH (ref 70–99)
Glucose-Capillary: 113 mg/dL — ABNORMAL HIGH (ref 70–99)
Glucose-Capillary: 111 mg/dL — ABNORMAL HIGH (ref 70–99)

## 2013-12-08 MED ORDER — ACETYLCYSTEINE 20 % IN SOLN
3.0000 mL | Freq: Three times a day (TID) | RESPIRATORY_TRACT | Status: AC
  Administered 2013-12-08 – 2013-12-10 (×8): 3 mL via RESPIRATORY_TRACT
  Filled 2013-12-08 (×9): qty 4

## 2013-12-08 MED ORDER — ACETYLCYSTEINE 20 % IN SOLN: 3.0000 mL | Freq: Three times a day (TID) | RESPIRATORY_TRACT | Status: DC

## 2013-12-08 NOTE — Progress Notes (Signed)
Subjective: No BIPAP needed overnight. More back pain. Not eating much. No BM   Objective: Vital signs in last 24 hours: Temp:  [96 F (35.6 C)-98 F (36.7 C)] 97.3 F (36.3 C) (04/28 0400) Pulse Rate:  [63-101] 97 (04/28 0800) Resp:  [15-27] 20 (04/28 0800) BP: (96-176)/(24-112) 131/69 mmHg (04/28 0800) SpO2:  [82 %-98 %] 96 % (04/28 0800) FiO2 (%):  [55 %] 55 % (04/28 0800)  Intake/Output from previous day: 04/27 0701 - 04/28 0700 In: 820 [I.V.:220; IV Piggyback:600] Out: 775 [Urine:775] Intake/Output this shift:    Lying on left side. Oral membranes moist. Lungs more diffuse wheeze and some rhonchi, ht regular distant. abd min distention. Awake, alert  Lab Results   Recent Labs  12/06/13 0336 12/07/13 0326  WBC 11.2* 9.7  RBC 4.21 4.33  HGB 13.5 13.9  HCT 43.4 44.0  MCV 103.1* 101.6*  MCH 32.1 32.1  RDW 13.7 13.6  PLT 324 280    Recent Labs  12/06/13 0336 12/07/13 0326  NA 139 139  K 4.2 4.3  CL 95* 95*  CO2 37* 35*  GLUCOSE 140* 105*  BUN 31* 30*  CREATININE 1.01 0.86  CALCIUM 8.8 9.1    Studies/Results: No results found.  Scheduled Meds: . ALIGN  1 capsule Oral q morning - 10a  . amLODipine  5 mg Oral QHS  . antiseptic oral rinse  15 mL Mouth Rinse BID  . atorvastatin  40 mg Oral QPM  . budesonide  0.25 mg Nebulization 4 times per day  . calcium carbonate  1 tablet Oral QHS  . DULoxetine  60 mg Oral q morning - 10a  . famotidine  20 mg Oral BID  . fentaNYL  75 mcg Transdermal QODAY  . ferrous sulfate  325 mg Oral TID WC  . gabapentin  300 mg Oral QHS  . imipenem-cilastatin  250 mg Intravenous Q6H  . ipratropium-albuterol  3 mL Nebulization Q6H  . nadolol  20 mg Oral QPM  . oxybutynin  5 mg Oral Daily  . predniSONE  10 mg Oral Q breakfast  . rivaroxaban  20 mg Oral QPM   Continuous Infusions:  PRN Meds:albuterol, ALPRAZolam, hydrALAZINE, HYDROmorphone (DILAUDID) injection, oxyCODONE-acetaminophen, polyethylene glycol, promethazine,  sodium chloride HYPERTONIC, traMADol  Assessment/Plan:  Acute and chronic respiratory failure: still fragile status. More bronchospasm today  Crohn's disease: Doing OK , needs to have a BM Chronic pain syndrome: She is maintained on many medications with fair control  Recurrent pulmonary embolism: She is on anticoagulation  COPD (chronic obstructive pulmonary disease) with emphysema: As above  HCAP (healthcare-associated pneumonia): now on extended Abx spectrum  Osteoporosis with fracture: more pain  Severe protein-calorie malnutrition: Difficult to reverse, not eating much  Impaired glucose tolerance with stress hyperglycemia: BS's improved Chronic adrenal insufficiency: She will remain on steroids  Hypertension: Doing a bit better , 131/69 Cranial neuralgia: Pain is under control  Anxiety and depression: Remain on medications  Unstable gait: New data  DNI status   LOS: 5 days   [place] 12/08/2013, 8:15 AM

## 2013-12-08 NOTE — Progress Notes (Signed)
Physical Therapy Treatment Patient Details Name: VEERA STAPLETON MRN: [id number] DOB: April 24, 1941 Today's Date: 12/08/2013    History of Present Illness Pt admitted with Acute and chronic respiratory failure    PT Comments    Pt was only able to tolerate sitting EOB and transferring to recliner due to dyspnea and MAX c/o fatigue.    Follow Up Recommendations  Home health PT     Equipment Recommendations  None recommended by PT    Recommendations for Other Services       Precautions / Restrictions Precautions Precautions: Fall Precaution Comments: monitor sats, O2 dependent Restrictions Weight Bearing Restrictions: No    Mobility  Bed Mobility Overal bed mobility: Needs Assistance Bed Mobility: Supine to Sit     Supine to sit: Min assist     General bed mobility comments: assist to raise trunk  Transfers Overall transfer level: Needs assistance Equipment used: Rolling walker (2 wheeled) Transfers: Sit to/from Stand Sit to Stand: +2 physical assistance;+2 safety/equipment;Mod assist Stand pivot transfers: Mod assist;+2 safety/equipment;+2 physical assistance       General transfer comment: Bed to recliner transfer with RW  Ambulation/Gait             General Gait Details: unable to tolerate   Stairs            Wheelchair Mobility    Modified Rankin (Stroke Patients Only)       Balance                                    Cognition                            Exercises      General Comments        Pertinent Vitals/Pain     Home Living                      Prior Function            PT Goals (current goals can now be found in the care plan section) Progress towards PT goals: Progressing toward goals    Frequency  Min 3X/week    PT Plan      Co-evaluation             End of Session Equipment Utilized During Treatment: Gait belt;Oxygen Activity Tolerance: Treatment limited  secondary to medical complications (Comment) Patient left: in chair;with call bell/phone within reach;with family/visitor present     Time: 2706-2376 PT Time Calculation (min): 27 min  Charges:  $Therapeutic Activity: 23-37 mins                    G Codes:      Rica Koyanagi  PTA [facility]  Acute  Rehab Pager      [phone]

## 2013-12-08 NOTE — Progress Notes (Signed)
[id number]/Burlin Mcnair, RN, BSN, CCM, [phone] Chart reviewed for update of needs and condition./  

## 2013-12-08 NOTE — Progress Notes (Signed)
PULMONARY / CRITICAL CARE MEDICINE   Name: Katherine Mckay MRN: [id number] DOB: 08/04/1941    ADMISSION DATE:  11/23/2013  REFERRING MD :  ER  CHIEF COMPLAINT:  Short of breath  BRIEF PATIENT DESCRIPTION:   73 y/o female with hypoxia, wheezing and pneumonia.  She has hx of COPD on chronic steroids (pred 10 mg/day), bronchiectasis, and home oxygen.  She was recently treated for pneumonia.  Pulmonary Hx:  11/10/12 Sputum >> Pseudomonas (pan sensitive) 11/17/12 CT chest >> centrilobular emphysema, mild BTX 12/09/12 PFT >> FEV1 0.94 (51%), FEV1% 45, TLC 5.09 (115%), DLCO 41%  SIGNIFICANT EVENTS: 4/23 - Admit with hypoxia, wheezing & PNA 4/28 - weak, poor secretion clearance  STUDIES:    LINES / TUBES: PIV  CULTURES: Blood 4/23 >>  Sputum 4/25 > abundant GNR>>  ANTIBIOTICS: Zithromax 4/23 >> 4/26 Azactam 4/23 >>4/26 Vancomycin 4/23 >> 4/25 Imipenem 4/26 >   SUBJECTIVE: "Cant clear my secretions"  VITAL SIGNS: Temp:  [96 F (35.6 C)-98 F (36.7 C)] 98 F (36.7 C) (04/28 0800) Pulse Rate:  [63-101] 97 (04/28 0800) Resp:  [15-27] 20 (04/28 0800) BP: (96-176)/(24-112) 131/69 mmHg (04/28 0800) SpO2:  [82 %-97 %] 96 % (04/28 0800) FiO2 (%):  [55 %] 55 % (04/28 0800)  INTAKE / OUTPUT: Intake/Output     04/27 0701 - 04/28 0700 04/28 0701 - 04/29 0700   P.O.     I.V. (mL/kg) 220 (4)    IV Piggyback 600    Total Intake(mL/kg) 820 (14.9)    Urine (mL/kg/hr) 775 (0.6)    Total Output 775     Net +45          Urine Occurrence 1 x      PHYSICAL EXAMINATION: General: Frail, weak, no acute resp distress HEENT: WNL PULM: diminished throughout, few scattered rhonchi, no wheezes noted CV: RRR s M AB: BS+, soft, nontender Ext: warm, trace symmetric edema Neuro: No focal deficits, diffusely weak  LABS: I have reviewed all of today's lab results. Relevant abnormalities are discussed in the A/P section  CXR: NNF  ASSESSMENT / PLAN:  PULMONARY A: Acute on chronic  respiratory failure - multifactorial  Suspected HCAP AECOPD - wheezing has resolved Bronchiectasis flare Possible mucus plugging  Restrictive Lung Disease - severe kyphosis P:   Continue baseline steroid dosing Cont nebulized BDs Nebulized steroids Cont abx  Mucomyst TID x 3 days Manual chest PT  CARDIOVASCULAR A:  Hx of HTN, hyperlipidemia. Hx of PE on chronic rivaroxaban as outpt P:  Continue current treatment.  RENAL A:   No acute issues P:   Monitor BMET intermittently Monitor I/Os Correct electrolytes as indicated  GASTROINTESTINAL A:   Protein calorie malnutrition Hx of Crohn's disease Chronic H2RB therapy P:   SUP: PO famotidine Cont PO nutrition  HEMATOLOGIC A:   H?O VTE P:  DVT px: full anticoagulation (Rivaroxaban) Monitor CBC intermittently Transfuse per usual ICU guidelines  INFECTIOUS A:   GNR HCAP Hx of Pseudomonas Chronic steroids P:   Micro and abx as above  ENDOCRINE A:   Steroid induced hyperglycemia - resolved  No prior hx of DM Hx of Osteoporosis.  P:   Cont systemic steroids @ baseline dose DC SSI Cont CBGs qAC - resume insuling coverage for glu > 180  NEUROLOGIC A:   Hx of Anxiety, depression. Chronic neuropathic pain. P:   - Pain medications per primary team.  GLOBAL: -DNI -in past, patient has been full DNR.  Continue ongoing discussions  regarding goals of care.    [name], NP-C [facility] Pgr: [phone] or [phone]   PCCM ATTENDING: I have interviewed and examined the patient and reviewed the database. I have formulated the assessment and plan as reflected in the note above with amendments made by me.   Merton Border, MD;  PCCM service; Mobile [phone]

## 2013-12-09 ENCOUNTER — Inpatient Hospital Stay (HOSPITAL_COMMUNITY): Payer: Medicare Other

## 2013-12-09 LAB — GLUCOSE, CAPILLARY
GLUCOSE-CAPILLARY: 82 mg/dL (ref 70–99)
Glucose-Capillary: 94 mg/dL (ref 70–99)
Glucose-Capillary: 100 mg/dL — ABNORMAL HIGH (ref 70–99)

## 2013-12-09 LAB — BASIC METABOLIC PANEL
BUN: 23 mg/dL (ref 6–23)
CALCIUM: 9.4 mg/dL (ref 8.4–10.5)
CHLORIDE: 98 meq/L (ref 96–112)
CO2: 37 meq/L — AB (ref 19–32)
CREATININE: 0.8 mg/dL (ref 0.50–1.10)
GFR calc non Af Amer: 73 mL/min — ABNORMAL LOW (ref >90)
GFR, EST AFRICAN AMERICAN: 85 mL/min — AB (ref >90)
GLUCOSE: 103 mg/dL — AB (ref 70–99)
Potassium: 4.5 mEq/L (ref 3.7–5.3)
SODIUM: 141 meq/L (ref 137–147)

## 2013-12-09 LAB — CULTURE, BLOOD (ROUTINE X 2)
CULTURE: NO GROWTH
Culture: NO GROWTH

## 2013-12-09 LAB — CBC
HCT: 42.6 % (ref 36.0–46.0)
Hemoglobin: 13.8 g/dL (ref 12.0–15.0)
MCH: 33.2 pg (ref 26.0–34.0)
MCHC: 32.4 g/dL (ref 30.0–36.0)
MCV: 102.4 fL — AB (ref 78.0–100.0)
PLATELETS: 240 10*3/uL (ref 150–400)
RBC: 4.16 MIL/uL (ref 3.87–5.11)
RDW: 13.9 % (ref 11.5–15.5)
WBC: 7.8 10*3/uL (ref 4.0–10.5)

## 2013-12-09 MED ORDER — BUDESONIDE 0.25 MG/2ML IN SUSP
0.2500 mg | Freq: Four times a day (QID) | RESPIRATORY_TRACT | Status: DC
  Administered 2013-12-09 – 2013-12-14 (×19): 0.25 mg via RESPIRATORY_TRACT
  Filled 2013-12-09 (×33): qty 2

## 2013-12-09 NOTE — Progress Notes (Signed)
Thank you for consulting the Palliative Medicine Team at [facility] to meet your patient's and family's needs.   The reason that you asked [place] to see your patient is  For  Clarification of GOC and options  We have scheduled your patient for a meeting: Tomorrow morning 11-13-13  at 0900 with husband at bedsdie with Wadie Lessen NP  Wadie Lessen NP  Palliative Medicine Team Team Phone # [phone] Pager [phone]

## 2013-12-09 NOTE — Progress Notes (Signed)
PULMONARY / CRITICAL CARE MEDICINE   Name: Katherine Mckay MRN: [id number] DOB: Jun 07, 1941    ADMISSION DATE:  12-24-13  REFERRING MD :  ER  CHIEF COMPLAINT:  Short of breath  BRIEF PATIENT DESCRIPTION:   73 y/o female with hypoxia, wheezing and pneumonia.  She has hx of COPD on chronic steroids (pred 10 mg/day), bronchiectasis, and home oxygen.  She was recently treated for pneumonia.  Pulmonary Hx:  11/10/12 Sputum >> Pseudomonas (pan sensitive) 11/17/12 CT chest >> centrilobular emphysema, mild BTX 12/09/12 PFT >> FEV1 0.94 (51%), FEV1% 45, TLC 5.09 (115%), DLCO 41%  SIGNIFICANT EVENTS: 4/23 - Admit with hypoxia, wheezing & PNA 4/28 - weak, poor secretion clearance 4/29- complains about chest PT hurting  STUDIES:    LINES / TUBES: PIV  CULTURES: Blood 4/23 >> neg Sputum 4/25 > abundant GNR>>psuedomonas ss primaxin, res to cipro  ANTIBIOTICS: Zithromax 4/23 >> 4/26 Azactam 4/23 >>4/26 Vancomycin 4/23 >> 4/25 Imipenem 4/26 >   SUBJECTIVE: PT of chest hurts  VITAL SIGNS: Temp:  [97.4 F (36.3 C)-98.6 F (37 C)] 98.6 F (37 C) (04/29 0800) Pulse Rate:  [67-89] 74 (04/29 0345) Resp:  [15-25] 19 (04/29 0345) BP: (112-161)/(44-87) 125/45 mmHg (04/29 0345) SpO2:  [91 %-97 %] 96 % (04/29 0826) FiO2 (%):  [40 %-55 %] 40 % (04/29 0826)  INTAKE / OUTPUT: Intake/Output     04/28 0701 - 04/29 0700 04/29 0701 - 04/30 0700   P.O. 360    I.V. (mL/kg) 210 (3.8)    IV Piggyback 400    Total Intake(mL/kg) 970 (17.6)    Urine (mL/kg/hr) 1025 (0.8)    Total Output 1025     Net -55            PHYSICAL EXAMINATION: General: Frail, weak, no acute resp distress, co pain from chest PT HEENT: WNL PULM: diminished throughout, few scattered rhonchi, no wheezes noted CV: RRR s M AB: BS+, soft, nontender Ext: warm, trace symmetric edema Neuro: No focal deficits, diffusely weak  LABS: Reviewed  [id number] opacity left base and new rt base opacity   ASSESSMENT /  PLAN:  PULMONARY A: Acute on chronic respiratory failure - multifactorial  Suspected HCAP AECOPD - wheezing has resolved Bronchiectasis flare Possible mucus plugging  Restrictive Lung Disease - severe kyphosis P:   Continue baseline steroid dosing Cont nebulized BDs Nebulized steroids Cont abx  Mucomyst TID x 3 days(started 4/28) Manual chest PT(does not tolerate well)  CARDIOVASCULAR A:  Hx of HTN, hyperlipidemia. Hx of PE on chronic rivaroxaban as outpt P:  Continue current treatment.  RENAL A:   No acute issues P:   Monitor BMET intermittently Monitor I/Os Correct electrolytes as indicated  GASTROINTESTINAL A:   Protein calorie malnutrition Hx of Crohn's disease Chronic H2RB therapy P:   SUP: PO famotidine Cont PO nutrition  HEMATOLOGIC A:   H?O VTE P:  DVT px: full anticoagulation (Rivaroxaban) Monitor CBC intermittently Transfuse per usual ICU guidelines  INFECTIOUS A:   GNR HCAP Hx of Pseudomonas Chronic steroids P:   Micro and abx as above  ENDOCRINE CBG (last 3)   Recent Labs  12/08/13 1134 12/08/13 2129 12/09/13 0802  GLUCAP 113* 111* 82     A:   Steroid induced hyperglycemia - resolved  No prior hx of DM Hx of Osteoporosis.  P:   Cont systemic steroids @ baseline dose DC SSI Cont CBGs qAC - resume insuling coverage for glu > 180  NEUROLOGIC A:  Hx of Anxiety, depression. Chronic neuropathic pain. P:   - Pain medications per primary team.  GLOBAL: -DNI -in past, patient has been full DNR.  Continue ongoing discussions regarding goals of care.  She does not like chest PT. Poor cough mechanics.   Richardson Landry Minor ACNP Maryanna Shape PCCM Pager [phone] till 3 pm If no answer page [phone] 12/09/2013, 9:46 AM   PCCM ATTENDING: I have interviewed and examined the patient and reviewed the database. I have formulated the assessment and plan as reflected in the note above with amendments made by me.   Have discussed goals of  care with pt and her husband. Will ask Palliative Care service to assist in defining further. She might benefit from Hospice after discharge.  Would like to get her down to [place] O2, then transfer to med-surg floor  Merton Border, MD;  PCCM service; Mobile [phone]

## 2013-12-09 NOTE — Progress Notes (Signed)
Pt wanted to sleep through midnight chest pt. Pt refused 0400 CPT. RN notified.

## 2013-12-09 NOTE — Progress Notes (Signed)
Patient refuses manual CPT at this time due to pain, RN aware.

## 2013-12-09 NOTE — Progress Notes (Signed)
Subjective: Doing a bit better. Got some sleep. Back pain is less intense. Eating fair at best  Objective: Vital signs in last 24 hours: Temp:  [97.4 F (36.3 C)-98.6 F (37 C)] 98.6 F (37 C) (04/28 2334) Pulse Rate:  [67-97] 74 (04/29 0345) Resp:  [15-25] 19 (04/29 0345) BP: (93-161)/(44-87) 125/45 mmHg (04/29 0345) SpO2:  [91 %-97 %] 93 % (04/29 0345) FiO2 (%):  [50 %-55 %] 50 % (04/29 0212)  Intake/Output from previous day: 04/28 0701 - 04/29 0700 In: 970 [P.O.:360; I.V.:210; IV Piggyback:400] Out: 1025 [Urine:1025] Intake/Output this shift:    Lying on side in no distress. VM in place. Some rhonchi but less wheeze,ht regular distant. abd sl distended. Awake, mentating well  Lab Results   Recent Labs  12/07/13 0326 12/09/13 0320  WBC 9.7 7.8  RBC 4.33 4.16  HGB 13.9 13.8  HCT 44.0 42.6  MCV 101.6* 102.4*  MCH 32.1 33.2  RDW 13.6 13.9  PLT 280 240    Recent Labs  12/07/13 0326 12/09/13 0320  NA 139 141  K 4.3 4.5  CL 95* 98  CO2 35* 37*  GLUCOSE 105* 103*  BUN 30* 23  CREATININE 0.86 0.80  CALCIUM 9.1 9.4       12/08/2013      Component Results    Component   Specimen Description   SPUTUM   Special Requests   NONE   Gram Stain   NO WBC SEEN NO SQUAMOUS EPITHELIAL CELLS SEEN ABUNDANT GRAM NEGATIVE RODS Performed at [facility] Performed at [facility]   Results for KEOSHA, ROSSA (MRN [id number]) as of 12/09/2013 08:03  Ref. Range 12/04/2013 03:12  Alkaline Phosphatase Latest Range: 39-117 U/L 150 (H)  Albumin Latest Range: 3.5-5.2 g/dL 2.5 (L)  AST Latest Range: 0-37 U/L 19  ALT Latest Range: 0-35 U/L 38 (H)  Total Protein Latest Range: 6.0-8.3 g/dL 6.3  Total Bilirubin Latest Range: 0.3-1.2 mg/dL 0.6    Studies/Results: Dg Chest Port 1 View  12/09/2013   CLINICAL DATA:  assess airspace disease  EXAM: PORTABLE CHEST - 1 VIEW  COMPARISON:  DG CHEST 1V PORT dated  12/06/2013  FINDINGS: The cardiac silhouette is partially obscured and otherwise stable. There is increased conspicuity of the left lower lobe density and interval development of opacity in the right lower lobe. The diffuse interstitial changes are stable. The bones are osteopenic. There is blunting of the costophrenic angles.  IMPRESSION: Progression of the left basilar opacity and interval development of a right basilar opacity, infiltrates versus atelectasis. Small bilateral effusions. Chronic interstitial changes stable.   Electronically Signed   By: Margaree Mackintosh M.D.   On: 12/09/2013 07:31    Scheduled Meds: . acetylcysteine  3 mL Nebulization TID  . ALIGN  1 capsule Oral q morning - 10a  . amLODipine  5 mg Oral QHS  . antiseptic oral rinse  15 mL Mouth Rinse BID  . atorvastatin  40 mg Oral QPM  . budesonide  0.25 mg Nebulization 4 times per day  . calcium carbonate  1 tablet Oral QHS  . DULoxetine  60 mg Oral q morning - 10a  . famotidine  20 mg Oral BID  . fentaNYL  75 mcg Transdermal QODAY  . ferrous sulfate  325 mg Oral TID WC  . gabapentin  300 mg Oral QHS  . imipenem-cilastatin  250 mg Intravenous Q6H  . ipratropium-albuterol  3 mL  Nebulization Q6H  . nadolol  20 mg Oral QPM  . oxybutynin  5 mg Oral Daily  . predniSONE  10 mg Oral Q breakfast  . rivaroxaban  20 mg Oral QPM   Continuous Infusions:  PRN Meds:albuterol, ALPRAZolam, hydrALAZINE, HYDROmorphone (DILAUDID) injection, oxyCODONE-acetaminophen, polyethylene glycol, promethazine, sodium chloride HYPERTONIC, traMADol  Assessment/Plan:   Acute and chronic respiratory failure: making a bit of forward progress. Still quite weak. Sats better  Crohn's disease: still no BM Chronic pain syndrome: doing pretty well  Recurrent pulmonary embolism: She is on anticoagulation  COPD (chronic obstructive pulmonary disease) with emphysema: As above  HCAP (healthcare-associated pneumonia): pseudomonas in sputum, on imipenem per  pulm Osteoporosis with fracture: sl better  Severe protein-calorie malnutrition: Difficult to reverse, not eating much , alb 2.5 Impaired glucose tolerance with stress hyperglycemia: BS's improved  FBS 103 Chronic adrenal insufficiency: She will remain on steroids , now on oral pred Hypertension: Doing a bit better ,  Cranial neuralgia: Pain is under control  Anxiety and depression: Remains on medications  Unstable gait: no new data  DNI status    LOS: 6 days   [place] 12/09/2013, 7:58 AM

## 2013-12-10 ENCOUNTER — Inpatient Hospital Stay (HOSPITAL_COMMUNITY): Payer: Medicare Other

## 2013-12-10 DIAGNOSIS — M549 Dorsalgia, unspecified: Secondary | ICD-10-CM

## 2013-12-10 DIAGNOSIS — Z515 Encounter for palliative care: Secondary | ICD-10-CM

## 2013-12-10 LAB — GLUCOSE, CAPILLARY
Glucose-Capillary: 128 mg/dL — ABNORMAL HIGH (ref 70–99)
Glucose-Capillary: 122 mg/dL — ABNORMAL HIGH (ref 70–99)
Glucose-Capillary: 114 mg/dL — ABNORMAL HIGH (ref 70–99)

## 2013-12-10 MED ORDER — BISACODYL 10 MG RE SUPP
10.0000 mg | Freq: Two times a day (BID) | RECTAL | Status: DC | PRN
  Administered 2013-12-10: 10 mg via RECTAL

## 2013-12-10 MED ORDER — BISACODYL 10 MG RE SUPP
10.0000 mg | Freq: Every day | RECTAL | Status: DC | PRN
  Administered 2013-12-10: 10 mg via RECTAL
  Filled 2013-12-10 (×2): qty 1

## 2013-12-10 MED ORDER — SORBITOL 70 % SOLN
30.0000 mL | Freq: Two times a day (BID) | Status: DC
  Administered 2013-12-10 – 2013-12-12 (×3): 30 mL via ORAL
  Filled 2013-12-10 (×10): qty 30

## 2013-12-10 MED ORDER — HYDROMORPHONE HCL PF 1 MG/ML IJ SOLN
0.2500 mg | INTRAMUSCULAR | Status: DC | PRN
  Administered 2013-12-10 (×2): 0.25 mg via INTRAVENOUS
  Administered 2013-12-10 – 2013-12-13 (×12): 1 mg via INTRAVENOUS
  Filled 2013-12-10 (×14): qty 1

## 2013-12-10 NOTE — Progress Notes (Signed)
Pt refused CPT at this time. Pt states she is in to much pain.

## 2013-12-10 NOTE — Progress Notes (Signed)
Physical Therapy Discharge Patient Details Name: Katherine Mckay MRN: [id number] DOB: 19-May-1941 Today's Date: 12/10/2013 Time:  - 14:16    Patient discharged from PT services secondary to medical decline - will need to re-order PT to resume therapy services. Pt requires Venturi Mask, Noted  Palliative care and MD notes.   Please see latest therapy progress note for current level of functioning and progress toward goals.    }  GP     [place] PT [phone]  12/10/2013, 2:16 PM

## 2013-12-10 NOTE — Progress Notes (Signed)
PULMONARY / CRITICAL CARE MEDICINE   Name: Katherine Mckay MRN: [id number] DOB: 1940/11/09    ADMISSION DATE:  11/20/2013  REFERRING MD :  ER  CHIEF COMPLAINT:  Short of breath  BRIEF PATIENT DESCRIPTION:   73 y/o female with hypoxia, wheezing and pneumonia.  She has hx of COPD on chronic steroids (pred 10 mg/day), bronchiectasis, and home oxygen.  She was recently treated for pneumonia. Pulmonary Hx:  11/10/12 Sputum >> Pseudomonas (pan sensitive) 11/17/12 CT chest >> centrilobular emphysema, mild BTX 12/09/12 PFT >> FEV1 0.94 (51%), FEV1% 45, TLC 5.09 (115%), DLCO 41%  SIGNIFICANT EVENTS: 4/23 Admit with hypoxia, wheezing & PNA 4/28 weak, poor secretion clearance 4/29 Remains very weak with poor cough. Desires to forgo chest PT due to discomfort 4/30 palliative care consult performed. Full DNR. Pt even wishes to forgo NPPV. DC planning with Hospice initiate. Might require residential Hospice facility  STUDIES:    LINES / TUBES: PIV  CULTURES: Blood 4/23 >> neg Sputum 4/25 > abundant GNR>>psuedomonas ss primaxin, res to cipro  ANTIBIOTICS: Zithromax 4/23 >> 4/26 Azactam 4/23 >>4/26 Vancomycin 4/23 >> 4/25 Imipenem 4/26 >>   SUBJECTIVE:   Looks better. No overt distress. Desats readily. Cough mechanics poor  VITAL SIGNS: Temp:  [97.5 F (36.4 C)-98.3 F (36.8 C)] 98.3 F (36.8 C) (04/30 0800) Pulse Rate:  [33-90] 90 (04/30 0100) Resp:  [16-24] 16 (04/30 0600) BP: (97-142)/(58-82) 132/67 mmHg (04/30 0400) SpO2:  [86 %-94 %] 90 % (04/30 0727) FiO2 (%):  [50 %] 50 % (04/30 0727)  INTAKE / OUTPUT: Intake/Output     04/29 0701 - 04/30 0700 04/30 0701 - 05/01 0700   P.O.     I.V. (mL/kg) 210 (3.8)    IV Piggyback 400    Total Intake(mL/kg) 610 (11.1)    Urine (mL/kg/hr) 100 (0.1)    Total Output 100     Net +510          Urine Occurrence 3 x      PHYSICAL EXAMINATION: General: Frail, weak, no acute resp distress HEENT: WNL PULM: diminished throughout, few  scattered rhonchi, no wheezes noted, kyphotic  CV: RRR s M AB: BS+, soft, nontender Ext: warm, trace symmetric edema Neuro: No focal deficits, diffusely weak  LABS: No new labs 4/30  QMV:HQIONGEXB edema pattern, + effusions bilat  ASSESSMENT / PLAN:  PULMONARY A: Acute on chronic respiratory failure - multifactorial  Suspected HCAP AECOPD - wheezing has resolved Bronchiectasis flare Possible mucus plugging  Restrictive Lung Disease - severe kyphosis P:   Continue baseline steroid dosing Cont nebulized BDs Nebulized steroids Cont abx  Mucomyst TID x 3 days(started 4/28)completed Manual chest PT(does not tolerate well)stopped  CARDIOVASCULAR A:  Hx of HTN, hyperlipidemia. Hx of PE on chronic rivaroxaban as outpt P:  Continue current treatment.  RENAL A:   No acute issues P:   Monitor BMET intermittently Monitor I/Os Correct electrolytes as indicated  GASTROINTESTINAL A:   Protein calorie malnutrition Hx of Crohn's disease Chronic H2RB therapy P:   SUP: PO famotidine Cont PO nutrition  HEMATOLOGIC A:   H/O VTE P:  DVT px: full anticoagulation (Rivaroxaban) Monitor CBC intermittently Transfuse per usual ICU guidelines  INFECTIOUS A:   GNR HCAP Hx of Pseudomonas Chronic steroids P:   Micro and abx as above  ENDOCRINE A:   Steroid induced hyperglycemia - resolved  No prior hx of DM Hx of Osteoporosis.  P:   Cont systemic steroids @ baseline dose Monitor glu  on chem panels   NEUROLOGIC A:   Hx of Anxiety, depression. Chronic neuropathic pain. P:   Cont current regimen   Richardson Landry Minor ACNP Maryanna Shape PCCM Pager [phone] till 3 pm If no answer page [phone] 12/10/2013, 10:11 AM  Pt and husband updated @ bedside. Palliative Care input much appreciated  Merton Border, MD ; [facility] [phone].  After 5:30 PM or weekends, call [phone]

## 2013-12-10 NOTE — Progress Notes (Signed)
Subjective: Back pain is severe today. I discussed Oxygenation c pt and husband this am.  Poor PO intake. Last good day was Tuesday. She has been begging for Dilaudid but after gets it she is out for @ 6 hrs.   Objective: Vital signs in last 24 hours: Temp:  [97.5 F (36.4 C)-98.6 F (37 C)] 98.1 F (36.7 C) (04/30 0400) Pulse Rate:  [33-90] 90 (04/30 0100) Resp:  [16-27] 16 (04/30 0600) BP: (97-147)/(58-82) 132/67 mmHg (04/30 0400) SpO2:  [86 %-96 %] 90 % (04/30 0727) FiO2 (%):  [40 %-50 %] 50 % (04/30 0727)  Intake/Output from previous day: 04/29 0701 - 04/30 0700 In: 610 [I.V.:210; IV Piggyback:400] Out: 100 [Urine:100] Intake/Output this shift:    Lying on back wearing O2 mask crying in pain Pulm c rhonchi but less wheeze ht regular distant.  abd distended and tender.   Lab Results   Recent Labs  12/09/13 0320  WBC 7.8  RBC 4.16  HGB 13.8  HCT 42.6  MCV 102.4*  MCH 33.2  RDW 13.9  PLT 240    Recent Labs  12/09/13 0320  NA 141  K 4.5  CL 98  CO2 37*  GLUCOSE 103*  BUN 23  CREATININE 0.80  CALCIUM 9.4       12/08/2013      Component Results    Component   Specimen Description   SPUTUM   Special Requests   NONE   Gram Stain   NO WBC SEEN NO SQUAMOUS EPITHELIAL CELLS SEEN ABUNDANT GRAM NEGATIVE RODS Performed at [place] Performed at [facility]   Results for Katherine Mckay, Katherine Mckay (MRN [id number]) as of 12/09/2013 08:03  Ref. Range 12/04/2013 03:12  Alkaline Phosphatase Latest Range: 39-117 U/L 150 (H)  Albumin Latest Range: 3.5-5.2 g/dL 2.5 (L)  AST Latest Range: 0-37 U/L 19  ALT Latest Range: 0-35 U/L 38 (H)  Total Protein Latest Range: 6.0-8.3 g/dL 6.3  Total Bilirubin Latest Range: 0.3-1.2 mg/dL 0.6    Studies/Results: Dg Chest Port 1 View  12/09/2013   CLINICAL DATA:  assess airspace disease  EXAM: PORTABLE CHEST - 1 VIEW  COMPARISON:  DG CHEST 1V PORT dated  12/06/2013  FINDINGS: The cardiac silhouette is partially obscured and otherwise stable. There is increased conspicuity of the left lower lobe density and interval development of opacity in the right lower lobe. The diffuse interstitial changes are stable. The bones are osteopenic. There is blunting of the costophrenic angles.  IMPRESSION: Progression of the left basilar opacity and interval development of a right basilar opacity, infiltrates versus atelectasis. Small bilateral effusions. Chronic interstitial changes stable.   Electronically Signed   By: Margaree Mackintosh M.D.   On: 12/09/2013 07:31    Scheduled Meds: . acetylcysteine  3 mL Nebulization TID  . ALIGN  1 capsule Oral q morning - 10a  . amLODipine  5 mg Oral QHS  . antiseptic oral rinse  15 mL Mouth Rinse BID  . atorvastatin  40 mg Oral QPM  . budesonide (PULMICORT) nebulizer solution  0.25 mg Nebulization Q6H  . calcium carbonate  1 tablet Oral QHS  . DULoxetine  60 mg Oral q morning - 10a  . famotidine  20 mg Oral BID  . fentaNYL  75 mcg Transdermal QODAY  . ferrous sulfate  325 mg Oral TID WC  . gabapentin  300 mg Oral QHS  . imipenem-cilastatin  250 mg Intravenous Q6H  .  ipratropium-albuterol  3 mL Nebulization Q6H  . nadolol  20 mg Oral QPM  . oxybutynin  5 mg Oral Daily  . predniSONE  10 mg Oral Q breakfast  . rivaroxaban  20 mg Oral QPM   Continuous Infusions:  PRN Meds:albuterol, ALPRAZolam, hydrALAZINE, HYDROmorphone (DILAUDID) injection, oxyCODONE-acetaminophen, polyethylene glycol, promethazine, sodium chloride HYPERTONIC, traMADol  Assessment/Plan:  Acute and chronic respiratory failure: Agree with DNI.  Would prefer DNR as I do not believe she will recover.  Agree with Palliative care consult.  I know the husband well and told him my concerns.  I told him that we can move to just a Hospice comfort approach.  They will discuss this with Dr Alva Garnet and palliative today.  This is a process that may take 1-3 days to  realize.  In mean time we will enhance Comfort measures but continue tools to try to improve = O2/Pulm Toilet/Steroids/Abx etc  Crohn's disease: still no BM.  She is getting more Distended and Ab pains.  Narcs making this worse. Constipation - bowel prep.  Chronic pain syndrome: doing terrible today.  I thought @ starting Fentanyl but she is already on at 75 mcg.  I adjusted the Dilauded to include a broader range of dosing.  Recurrent pulmonary embolism: She is on anticoagulation   COPD (chronic obstructive pulmonary disease) with emphysema/bronchiectasis: As above  HCAP (healthcare-associated pneumonia): pseudomonas in sputum, on imipenem per pulm Osteoporosis with compression fracture - on pain meds. Severe protein-calorie malnutrition: Difficult to reverse, not eating much , alb 2.5 Impaired glucose tolerance with stress hyperglycemia: BS's  fine Chronic adrenal insufficiency: oral pred Hypertension: fine  Cranial neuralgia:  Anxiety and depression: Remains on medications  Unstable gait: no new data  DNI status but appears headed for DNR c hospice Palliative Care    LOS: 7 days   [name] 12/10/2013, 7:35 AM

## 2013-12-10 NOTE — Progress Notes (Signed)
[id number]/Rhonda Davis, RN, BSN, CCM  [phone]  Chart Reviewed for discharge and hospital needs.  Discharge needs at time of review: None present will follow for needs.  Review of patient progress due on [id number]. 

## 2013-12-10 NOTE — Consult Note (Signed)
Patient Katherine Mckay      DOB: 09/27/1940      NWG:[id number]     Consult Note from the Palliative Medicine Team at [place] Requested by: Dr Forde Dandy     PCP: Katherine Stack, MD Reason for Consultation:Clarification of [place] and options     Phone Number:[phone]  Assessment of patients Current state:  Overall long term poor prognosis.  Both remain hopeful for improvement.    Patient and her family are faced with advanced directive decisions and anticipatory care needs   Consult is for review of medical treatment options, clarification of goals of care and end of life issues, disposition and options, and symptom recommendation.  This NP Katherine Mckay reviewed medical records, received report from team, assessed the patient and then meet at the patient's bedside along with her husband Katherine Mckay # [phone]  to discuss diagnosis prognosis, GOC, EOL wishes disposition and options.  A detailed discussion was had today regarding advanced directives.  Concepts specific to code status, artifical feeding and hydration, continued IV antibiotics and rehospitalization was had.  The difference between a aggressive medical intervention path  and a palliative comfort care path for this patient at this time was had.  Values and goals of care important to patient and family were attempted to be elicited.  Concept of Hospice and Palliative Care were discussed  Natural trajectory and expectations at EOL were discussed.  Questions and concerns addressed.  Hard Choices booklet left for review. Family encouraged to call with questions or concerns.  PMT will continue to support holistically.   Goals of Care: 1.  Code Status:DNR/DNI/no BiPap   2. Scope of Treatment:  Patietn and her husband remain hopeful for improvement. They also undersatnd the overall poor prognosis.  Continue presertn medcail intervetnions within the context of comfort  3. Disposition:  Dependant on  outcomes   4. Symptom Management:   1. Anxiety/Agitation:Xanax .25 mg PO tid prn 2. Pain: Fentanly patch 75 mcg                Percocet 5-325 mg PO every 4 hrs prn                  Dilaudid- 0.25- 1mg  IV every 4 hrs prn  3. Bowel Regimen: Dulcolax supp prn daily (patietn tells me this is what she uses at home) 4. Weakness:  PT/OT as tolerated   5. Psychosocial:  Emotional support offered to patient and her husband at bedside.  Four children live out of town, limited local support for husband as caregiver  6. Spiritual:  Chaplin involved     Patient Documents Completed or Given: Document Given Completed  Advanced Directives Pkt    MOST yes   DNR    Gone from My Sight    Hard Choices yes     Brief HPI:Ms. Katherine Mckay is a 73 year old patient of Dr. Forde Dandy with a complex medical history mainly including COPD, hyperlipidemia, essential hypertension, osteoporosis with multiple compression fractures and pain, and Crohn disease admitted  with increasing shortness of breath, cough and congestion. She live with her husband who is recently been ill and relates that she is caught "his bug".     ROS: weakness, back pain, weak cough, poor appetite     PMH:  Past Medical History  Diagnosis Date  . Polio   . Osteoporosis     multiple compression fractures  . Crohn's disease     since 1966 on  chronic steroids s/p ileal resection, fistula repair  . Brain tumor   . Migraines   . Hypercholesteremia   . Hypertension   . GERD (gastroesophageal reflux disease)   . Anxiety   . Depression   . Emphysema   . Macular degeneration   . Cataract   . Arthritis   . Pernicious anemia   . Primary adrenal deficiency   . COPD (chronic obstructive pulmonary disease)   . Neuropathy, idiopathic   . Anticoagulated on warfarin     Hx of DVT, PE  . Deafness in right ear      PSH: Past Surgical History  Procedure Laterality Date  . Cholecystectomy    . Cataract repair    . Ileal resection    .  Hemicolectomy      rt due to crohns  . Colonoscopy  08/17/2011    Procedure: COLONOSCOPY;  Surgeon: Landry Dyke, MD;  Location: [facility];  Service: Endoscopy;  Laterality: N/A;  . Givens capsule study  09/27/2011    Procedure: GIVENS CAPSULE STUDY;  Surgeon: Landry Dyke, MD;  Location: [facility];  Service: Endoscopy;  Laterality: N/A;   I have reviewed the [place] and SH and  If appropriate update it with new information. Allergies  Allergen Reactions  . Celebrex [Celecoxib] Shortness Of Breath    No voiding  . Penicillins Shortness Of Breath and Rash  . Smz-Tmp Ds [Sulfamethoxazole W/Trimethoprim (Co-Trimoxazole)] Rash  . Ceclor [Cefaclor] Other (See Comments)    Stomach pain  . Ciprofloxacin Hcl     Possible GI Bleed.  . Diclofenac Sodium     Pt doesn't remember reaction  . Doxycycline     Stomach pains.  . Flagyl [Metronidazole Hcl]     Vaginal rash  . Hydrocodone Other (See Comments)    Severe stomach pain  . Iohexol      Code: HIVES, Desc: pt states she breaks out in hives and sneezes 10/01/08   Desc: STATES SHE ONLY REACTS BY SNEEZING   . Other Other (See Comments)    Darvocet - liver damage  . Percocet [Oxycodone-Acetaminophen] Other (See Comments)    Liver damage  . Prilosec [Omeprazole] Other (See Comments)    Causes ulcers in stomach and esophagus  . Tylenol [Acetaminophen] Other (See Comments)    Liver damage   Scheduled Meds: . acetylcysteine  3 mL Nebulization TID  . ALIGN  1 capsule Oral q morning - 10a  . amLODipine  5 mg Oral QHS  . antiseptic oral rinse  15 mL Mouth Rinse BID  . atorvastatin  40 mg Oral QPM  . budesonide (PULMICORT) nebulizer solution  0.25 mg Nebulization Q6H  . calcium carbonate  1 tablet Oral QHS  . DULoxetine  60 mg Oral q morning - 10a  . famotidine  20 mg Oral BID  . fentaNYL  75 mcg Transdermal QODAY  . ferrous sulfate  325 mg Oral TID WC  . gabapentin  300 mg Oral QHS  . imipenem-cilastatin  250 mg Intravenous Q6H   . ipratropium-albuterol  3 mL Nebulization Q6H  . nadolol  20 mg Oral QPM  . oxybutynin  5 mg Oral Daily  . predniSONE  10 mg Oral Q breakfast  . rivaroxaban  20 mg Oral QPM  . sorbitol  30 mL Oral BID   Continuous Infusions:  PRN Meds:.albuterol, ALPRAZolam, hydrALAZINE, HYDROmorphone (DILAUDID) injection, oxyCODONE-acetaminophen, polyethylene glycol, promethazine, sodium chloride HYPERTONIC, traMADol    BP 132/67  Pulse 90  Temp(Src) 98.1 F (36.7 C) (Oral)  Resp 16  Ht 5\' 3"  (1.6 m)  Wt 55.1 kg (121 lb 7.6 oz)  BMI 21.52 kg/m2  SpO2 90%   PPS:30 % at best   Intake/Output Summary (Last 24 hours) at 12/10/13 0859 Last data filed at 12/10/13 0600  Gross per 24 hour  Intake    600 ml  Output      0 ml  Net    600 ml   LBM: PTA                        Physical Exam:  General: chronically ill appearing, weak and frail HEENT:  MM, no exudate Chest:   Distant BS, scattered RH CVS: RRR Abdomen: slightly distended, decreased BS, tender Ext:  Without edema Neuro:alert and oriented  Labs: CBC    Component Value Date/Time   WBC 7.8 12/09/2013 0320   RBC 4.16 12/09/2013 0320   RBC 3.89 01/28/2012 1324   HGB 13.8 12/09/2013 0320   HCT 42.6 12/09/2013 0320   PLT 240 12/09/2013 0320   MCV 102.4* 12/09/2013 0320   MCH 33.2 12/09/2013 0320   MCHC 32.4 12/09/2013 0320   RDW 13.9 12/09/2013 0320   LYMPHSABS 1.0 11/19/2013 1250   MONOABS 1.3* 11/30/2013 1250   EOSABS 0.0 12/08/2013 1250   BASOSABS 0.0 11/19/2013 1250    BMET    Component Value Date/Time   NA 141 12/09/2013 0320   K 4.5 12/09/2013 0320   CL 98 12/09/2013 0320   CO2 37* 12/09/2013 0320   GLUCOSE 103* 12/09/2013 0320   BUN 23 12/09/2013 0320   CREATININE 0.80 12/09/2013 0320   CALCIUM 9.4 12/09/2013 0320   GFRNONAA 73* 12/09/2013 0320   GFRAA 85* 12/09/2013 0320    CMP     Component Value Date/Time   NA 141 12/09/2013 0320   K 4.5 12/09/2013 0320   CL 98 12/09/2013 0320   CO2 37* 12/09/2013 0320   GLUCOSE 103*  12/09/2013 0320   BUN 23 12/09/2013 0320   CREATININE 0.80 12/09/2013 0320   CALCIUM 9.4 12/09/2013 0320   PROT 6.3 12/04/2013 0312   ALBUMIN 2.5* 12/04/2013 0312   AST 19 12/04/2013 0312   ALT 38* 12/04/2013 0312   ALKPHOS 150* 12/04/2013 0312   BILITOT 0.6 12/04/2013 0312   GFRNONAA 73* 12/09/2013 0320   GFRAA 85* 12/09/2013 0320      Time In Time Out Total Time Spent with Patient Total Overall Time  0930 1100 80 min 90 min    Greater than 50%  of this time was spent counseling and coordinating care related to the above assessment and plan. PMT will continue to support holistically   Katherine Lessen NP  Palliative Medicine Team Team Phone # [phone] Pager [phone]  Discussed with Dr Alva Garnet

## 2013-12-10 NOTE — Progress Notes (Signed)
PT Cancellation Note  Patient Details Name: [name] MRN: [id number] DOB: [birth year]   Cancelled Treatment:    Reason Eval/Treat Not Completed: Medical issues which prohibited therapy (On venturi mask, Palliative care  following, appears to be in a lot of pain.)   Shella Maxim [facility] 12/10/2013, 10:00 AM Tresa Endo PT [phone]

## 2013-12-11 ENCOUNTER — Inpatient Hospital Stay (HOSPITAL_COMMUNITY): Payer: Medicare Other

## 2013-12-11 DIAGNOSIS — R531 Weakness: Secondary | ICD-10-CM

## 2013-12-11 DIAGNOSIS — F411 Generalized anxiety disorder: Secondary | ICD-10-CM

## 2013-12-11 DIAGNOSIS — R0602 Shortness of breath: Secondary | ICD-10-CM

## 2013-12-11 DIAGNOSIS — M549 Dorsalgia, unspecified: Secondary | ICD-10-CM

## 2013-12-11 LAB — CBC
HCT: 39.8 % (ref 36.0–46.0)
HEMOGLOBIN: 12.3 g/dL (ref 12.0–15.0)
MCH: 31.9 pg (ref 26.0–34.0)
MCHC: 30.9 g/dL (ref 30.0–36.0)
MCV: 103.4 fL — AB (ref 78.0–100.0)
Platelets: 255 10*3/uL (ref 150–400)
RBC: 3.85 MIL/uL — AB (ref 3.87–5.11)
RDW: 14.3 % (ref 11.5–15.5)
WBC: 12.1 10*3/uL — ABNORMAL HIGH (ref 4.0–10.5)

## 2013-12-11 LAB — BASIC METABOLIC PANEL
BUN: 23 mg/dL (ref 6–23)
CALCIUM: 8.9 mg/dL (ref 8.4–10.5)
CO2: 36 mEq/L — ABNORMAL HIGH (ref 19–32)
CREATININE: 0.83 mg/dL (ref 0.50–1.10)
Chloride: 98 mEq/L (ref 96–112)
GFR calc Af Amer: 81 mL/min — ABNORMAL LOW (ref >90)
GFR calc non Af Amer: 70 mL/min — ABNORMAL LOW (ref >90)
GLUCOSE: 126 mg/dL — AB (ref 70–99)
Potassium: 5.1 mEq/L (ref 3.7–5.3)
Sodium: 139 mEq/L (ref 137–147)

## 2013-12-11 MED ORDER — ENSURE COMPLETE PO LIQD
237.0000 mL | Freq: Two times a day (BID) | ORAL | Status: DC
  Administered 2013-12-13: 237 mL via ORAL

## 2013-12-11 NOTE — Progress Notes (Signed)
Subjective: She is much more comfortable today. CXR worsening.  BP soft.  Oxygenation up and down. She wants to get out of bed today S/P BM yesterday and feels some better Poor PO intake.   Objective: Vital signs in last 24 hours: Temp:  [97.4 F (36.3 C)-98.3 F (36.8 C)] 97.9 F (36.6 C) (05/01 0400) Pulse Rate:  [36-90] 73 (05/01 0600) Resp:  [13-32] 15 (05/01 0600) BP: (78-151)/(39-108) 101/57 mmHg (05/01 0600) SpO2:  [75 %-98 %] 96 % (05/01 0600) FiO2 (%):  [50 %-100 %] 100 % (04/30 1915) Weight:  [54.9 kg (121 lb 0.5 oz)] 54.9 kg (121 lb 0.5 oz) (05/01 0400)  Intake/Output from previous day: 04/30 0701 - 05/01 0700 In: 675 [P.O.:145; I.V.:130; IV Piggyback:400] Out: 950 [Urine:950] Intake/Output this shift:    Sitting up in bed much more comfortable today wearing O2 mask Pulm c rhonchi but less wheeze and Decrease at bases. ht regular distant.  abd less distended and less tender.   Lab Results   Recent Labs  12/09/13 0320 12/11/13 0306  WBC 7.8 12.1*  RBC 4.16 3.85*  HGB 13.8 12.3  HCT 42.6 39.8  MCV 102.4* 103.4*  MCH 33.2 31.9  RDW 13.9 14.3  PLT 240 255    Recent Labs  12/09/13 0320 12/11/13 0306  NA 141 139  K 4.5 5.1  CL 98 98  CO2 37* 36*  GLUCOSE 103* 126*  BUN 23 23  CREATININE 0.80 0.83  CALCIUM 9.4 8.9       12/08/2013      Component Results    Component   Specimen Description   SPUTUM   Special Requests   NONE   Gram Stain   NO WBC SEEN NO SQUAMOUS EPITHELIAL CELLS SEEN ABUNDANT GRAM NEGATIVE RODS Performed at [place] Performed at [facility]   Results for Katherine Mckay, Katherine Mckay (MRN [id number]) as of 12/09/2013 08:03  Ref. Range 12/04/2013 03:12  Alkaline Phosphatase Latest Range: 39-117 U/L 150 (H)  Albumin Latest Range: 3.5-5.2 g/dL 2.5 (L)  AST Latest Range: 0-37 U/L 19  ALT Latest Range: 0-35 U/L 38 (H)  Total Protein Latest Range: 6.0-8.3 g/dL 6.3   Total Bilirubin Latest Range: 0.3-1.2 mg/dL 0.6    Studies/Results: Dg Chest Port 1 View  12/10/2013   CLINICAL DATA:  Pneumonia  EXAM: PORTABLE CHEST - 1 VIEW  COMPARISON:  DG CHEST 1V PORT dated 12/09/2013; DG CHEST 1V PORT dated 12/06/2013; DG CHEST 1V PORT dated 12/04/2013; DG CHEST 2 VIEW dated 11/20/2013; DG CHEST 1V PORT dated 11/15/2013  FINDINGS: Grossly unchanged enlarged cardiac silhouette and mediastinal contours with slight differences attributable to patient rotation and persistent kyphotic projection. The lungs remain hyperexpanded. The pulmonary vasculature remains indistinct with cephalization of flow. Layering bilateral effusions appear unchanged to slightly increased in the interval with associated bilateral mid and lower lung opacities, right greater than left. Grossly unchanged bones including old bulb fractures involving the lateral aspects of the right 4th, 5th and 6th ribs.  IMPRESSION: 1. Findings suggestive of worsening pulmonary edema superimposed on advanced emphysematous change. 2. Interval development of small bilateral effusions with associated bilateral mid and lower lung opacities, atelectasis versus infiltrate.   Electronically Signed   By: Sandi Mariscal M.D.   On: 12/10/2013 07:40    Scheduled Meds: . acetylcysteine  3 mL Nebulization TID  . ALIGN  1 capsule Oral q morning - 10a  . amLODipine  5  mg Oral QHS  . antiseptic oral rinse  15 mL Mouth Rinse BID  . atorvastatin  40 mg Oral QPM  . budesonide (PULMICORT) nebulizer solution  0.25 mg Nebulization Q6H  . calcium carbonate  1 tablet Oral QHS  . DULoxetine  60 mg Oral q morning - 10a  . famotidine  20 mg Oral BID  . fentaNYL  75 mcg Transdermal QODAY  . ferrous sulfate  325 mg Oral TID WC  . gabapentin  300 mg Oral QHS  . imipenem-cilastatin  250 mg Intravenous Q6H  . ipratropium-albuterol  3 mL Nebulization Q6H  . nadolol  20 mg Oral QPM  . oxybutynin  5 mg Oral Daily  . predniSONE  10 mg Oral Q breakfast  .  rivaroxaban  20 mg Oral QPM  . sorbitol  30 mL Oral BID   Continuous Infusions:  PRN Meds:albuterol, ALPRAZolam, bisacodyl, hydrALAZINE, HYDROmorphone (DILAUDID) injection, oxyCODONE-acetaminophen, polyethylene glycol, promethazine, sodium chloride HYPERTONIC, traMADol  Assessment/Plan:  Acute and chronic respiratory failure: Agree with DNR/DNI as she has a poor prognosis  Appreciate Palliative care consult.  Continue c comfort measures and treatments.  If in 1-3 days she continue to worsen and declare that there is no improvement to be had we can transition to Comfort only. Continue Pulm CCM consult/Pulm Toilet/Steroids/Abx etc.  Unfortunately CXR is worsening and more Pl effusion.  Cannot Diuresis c soft BP.  Crohn's disease: Not active.   Constipation - S/P BM Yesterday.  Narcs making this worse.  - bowel prep.  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 Severe protein-calorie malnutrition: Difficult to reverse, not eating much, alb 2.5 Impaired glucose tolerance with stress hyperglycemia: BS's  fine Chronic adrenal insufficiency: oral pred Hypertension: fine  Anxiety and depression: Remains on medications  Unstable gait/Weakness/Deconditioning: no new data PT/OT as able.    LOS: 8 days   Katherine Mckay 12/11/2013, 7:25 AM

## 2013-12-11 NOTE — Progress Notes (Signed)
INITIAL NUTRITION ASSESSMENT  DOCUMENTATION CODES Per approved criteria  -Not Applicable   INTERVENTION: - Diet advancement per SLP/MD - Will order Ensure Complete BID once diet advanced  - Nutrition per palliative discussion - Will continue to monitor   NUTRITION DIAGNOSIS: Inadequate oral intake related to inability to eat as evidenced by NPO.    Goal: Advance diet as tolerated   Monitor:  Weights, labs, diet advancement, goals of care   Reason for Assessment: Malnutrition screening tool   73 y.o. female  Admitting Dx: Acute and chronic respiratory failure  ASSESSMENT: Pt came to ER after home care nurse noted pt with worsening hypoxia and wheezing. Hx of osteoporosis, Crohn's disease, brain tumor, HTN, GERD, hypercholesteremia, COPD, and depression. Palliative care following.   Pt alone in room, called husband. He reports at home pt would eat 2 good meals a day, breakfast and lunch, and would use Ensure occasionally. Thinks pt's weight has gone up a little bit recently. Reports at home pt prefers "slider foods" such as milkshakes and pies. States pt has not consumed anything today as pt had episode of coughing after taking a pill this morning and was made NPO, awaiting SLP evaluation. SLP attempted visit however pt on venti mask and pt unable to successfully wean without desaturating.    Height: Ht Readings from Last 1 Encounters:  11/16/2013 5\' 3"  (1.6 m)    Weight: Wt Readings from Last 1 Encounters:  12/11/13 121 lb 0.5 oz (54.9 kg)    Ideal Body Weight: 115 lbs   % Ideal Body Weight: 105%  Wt Readings from Last 10 Encounters:  12/11/13 121 lb 0.5 oz (54.9 kg)  11/15/13 121 lb 11.2 oz (55.203 kg)  01/01/13 132 lb 12.8 oz (60.238 kg)  12/09/12 137 lb (62.143 kg)  11/07/12 136 lb (61.689 kg)  07/18/12 128 lb 9.6 oz (58.333 kg)  01/30/12 120 lb 9.5 oz (54.7 kg)  09/27/11 117 lb (53.071 kg)  09/27/11 117 lb (53.071 kg)  08/15/11 134 lb 0.6 oz (60.8 kg)     Usual Body Weight: 121 lbs   % Usual Body Weight: 100%  BMI:  Body mass index is 21.45 kg/(m^2).  Estimated Nutritional Needs: Kcal: 1600-1800 Protein: 70-90g Fluid: 1.6-1.8L/day  Skin: Non-pitting RUE, LUE, +1 RLE, LLE edema, stage 2 right flank pressure ulcer, reddened area on right hip that pt has had for years  Diet Order: NPO  EDUCATION NEEDS: -No education needs identified at this time   Intake/Output Summary (Last 24 hours) at 12/11/13 1459 Last data filed at 12/11/13 1415  Gross per 24 hour  Intake    635 ml  Output    750 ml  Net   -115 ml    Last BM: 4/21  Labs:   Recent Labs Lab 12/05/13 0342  12/07/13 0326 12/09/13 0320 12/11/13 0306  NA 142  < > 139 141 139  K 3.9  < > 4.3 4.5 5.1  CL 96  < > 95* 98 98  CO2 38*  < > 35* 37* 36*  BUN 34*  < > 30* 23 23  CREATININE 1.05  < > 0.86 0.80 0.83  CALCIUM 9.1  < > 9.1 9.4 8.9  MG 1.7  --   --   --   --   PHOS 3.9  --   --   --   --   GLUCOSE 120*  < > 105* 103* 126*  < > = values in this interval not displayed.  CBG (last 3)   Recent Labs  12/10/13 0804 12/10/13 1156 12/10/13 1600  GLUCAP 128* 122* 114*    Scheduled Meds: . ALIGN  1 capsule Oral q morning - 10a  . amLODipine  5 mg Oral QHS  . antiseptic oral rinse  15 mL Mouth Rinse BID  . atorvastatin  40 mg Oral QPM  . budesonide (PULMICORT) nebulizer solution  0.25 mg Nebulization Q6H  . calcium carbonate  1 tablet Oral QHS  . DULoxetine  60 mg Oral q morning - 10a  . famotidine  20 mg Oral BID  . fentaNYL  75 mcg Transdermal QODAY  . ferrous sulfate  325 mg Oral TID WC  . gabapentin  300 mg Oral QHS  . imipenem-cilastatin  250 mg Intravenous Q6H  . ipratropium-albuterol  3 mL Nebulization Q6H  . nadolol  20 mg Oral QPM  . oxybutynin  5 mg Oral Daily  . predniSONE  10 mg Oral Q breakfast  . rivaroxaban  20 mg Oral QPM  . sorbitol  30 mL Oral BID    Continuous Infusions:   Past Medical History  Diagnosis Date  . Polio    . Osteoporosis     multiple compression fractures  . Crohn's disease     since 1966 on chronic steroids s/p ileal resection, fistula repair  . Brain tumor   . Migraines   . Hypercholesteremia   . Hypertension   . GERD (gastroesophageal reflux disease)   . Anxiety   . Depression   . Emphysema   . Macular degeneration   . Cataract   . Arthritis   . Pernicious anemia   . Primary adrenal deficiency   . COPD (chronic obstructive pulmonary disease)   . Neuropathy, idiopathic   . Anticoagulated on warfarin     Hx of DVT, PE  . Deafness in right ear     Past Surgical History  Procedure Laterality Date  . Cholecystectomy    . Cataract repair    . Ileal resection    . Hemicolectomy      rt due to crohns  . Colonoscopy  08/17/2011    Procedure: COLONOSCOPY;  Surgeon: Landry Dyke, MD;  Location: [facility];  Service: Endoscopy;  Laterality: N/A;  . Givens capsule study  09/27/2011    Procedure: GIVENS CAPSULE STUDY;  Surgeon: Landry Dyke, MD;  Location: [facility];  Service: Endoscopy;  Laterality: N/A;    Mikey College MS, [place], [place] Pager [phone] After Hours Pager

## 2013-12-11 NOTE — Consult Note (Signed)
I have reviewed this case with our NP and agree with the Assessment and Plan as stated.  Kalyse Meharg L. Letzy Gullickson, MD MBA The Palliative Medicine Team at [place] Team Phone: [phone] Pager: [phone]   

## 2013-12-11 NOTE — Progress Notes (Signed)
Follow-up with patient's husband- explained that swallow evaluation was deferred until tomorrow due to patient desaturating when removing NRB. Husband upset, but willing to wait until tomorrow for the swallow evaluation. He stated that "at 8 o'clock I will be here, at 11:00, I will be getting upset, by 3:00, I hate to be the person to cross my path". Family is not willing to "comfort feed" at this time and family will discuss with MD future plans pending swallow evaluation.

## 2013-12-11 NOTE — Progress Notes (Signed)
Spoke with Dr. Virgina Jock about patient NPO status. Patient is coughing and desaturating when sipping water. Patient is also requiring NRB 100% 15 liters to keep 02 Sats above 90%. Nurse is worried about patient aspirating. Swallow evaluation ordered for patient. Discussed situation with family and patient.

## 2013-12-11 NOTE — Progress Notes (Signed)
SLP Cancellation Note  Patient Details Name: DENAJA VERHOEVEN MRN: [id number] DOB: Jan 07, 1941   Cancelled treatment:        Bedside swallow eval deferred as pt unable to successfully wean from venti mask without desat. Will f/u tomorrow to re-attempt swallow eval. Palliative consult noted, comfort feeds likely.   Hewitt Shorts Nashika Coker 12/11/2013, 12:54 PM

## 2013-12-11 NOTE — Progress Notes (Signed)
Tried to wean patient to venturimask 55% Fi02 for aprox 30 minutes. Patient unsuccessful. Patient would constantly desat into low 80's high 70's.

## 2013-12-11 NOTE — Progress Notes (Signed)
12/11/13 1100  Clinical Encounter Type  Visited With Patient not available (sleeping; no family present)  Referral From Palliative care team   Attempted visit, but pt sleeping and no family present.  Please page as needs arise:  [phone].  Thank you.  [place], [place]

## 2013-12-11 NOTE — Progress Notes (Signed)
Progress Note from the Palliative Medicine Team at [place]:  -patient is more alert today and "feels better"  -husband at bedside, continued conversation regarding care options as it relates to aggressive medical intraventions vs comfort path  -we discussed natural trajectory at end of life,  and utilization of an in-patient hospice [facility]) "if it gets to that"  -he remains hopeful for improvement but verbalizes an understanding of her fragile situation   Objective: Allergies  Allergen Reactions  . Celebrex [Celecoxib] Shortness Of Breath    No voiding  . Penicillins Shortness Of Breath and Rash  . Smz-Tmp Ds [Sulfamethoxazole W/Trimethoprim (Co-Trimoxazole)] Rash  . Ceclor [Cefaclor] Other (See Comments)    Stomach pain  . Ciprofloxacin Hcl     Possible GI Bleed.  . Diclofenac Sodium     Pt doesn't remember reaction  . Doxycycline     Stomach pains.  . Flagyl [Metronidazole Hcl]     Vaginal rash  . Hydrocodone Other (See Comments)    Severe stomach pain  . Iohexol      Code: HIVES, Desc: pt states she breaks out in hives and sneezes 10/01/08   Desc: STATES SHE ONLY REACTS BY SNEEZING   . Other Other (See Comments)    Darvocet - liver damage  . Percocet [Oxycodone-Acetaminophen] Other (See Comments)    Liver damage  . Prilosec [Omeprazole] Other (See Comments)    Causes ulcers in stomach and esophagus  . Tylenol [Acetaminophen] Other (See Comments)    Liver damage   Scheduled Meds: . ALIGN  1 capsule Oral q morning - 10a  . amLODipine  5 mg Oral QHS  . antiseptic oral rinse  15 mL Mouth Rinse BID  . atorvastatin  40 mg Oral QPM  . budesonide (PULMICORT) nebulizer solution  0.25 mg Nebulization Q6H  . calcium carbonate  1 tablet Oral QHS  . DULoxetine  60 mg Oral q morning - 10a  . famotidine  20 mg Oral BID  . fentaNYL  75 mcg Transdermal QODAY  . ferrous sulfate  325 mg Oral TID WC  . gabapentin  300 mg Oral QHS  . imipenem-cilastatin   250 mg Intravenous Q6H  . ipratropium-albuterol  3 mL Nebulization Q6H  . nadolol  20 mg Oral QPM  . oxybutynin  5 mg Oral Daily  . predniSONE  10 mg Oral Q breakfast  . rivaroxaban  20 mg Oral QPM  . sorbitol  30 mL Oral BID   Continuous Infusions:  PRN Meds:.albuterol, ALPRAZolam, bisacodyl, hydrALAZINE, HYDROmorphone (DILAUDID) injection, oxyCODONE-acetaminophen, polyethylene glycol, promethazine, sodium chloride HYPERTONIC, traMADol  BP 126/73  Pulse 75  Temp(Src) 97.2 F (36.2 C) (Oral)  Resp 25  Ht 5\' 3"  (1.6 m)  Wt 54.9 kg (121 lb 0.5 oz)  BMI 21.45 kg/m2  SpO2 93%   PPS: 30%  Pain Score:  4/10  Pain Location back and heels   Intake/Output Summary (Last 24 hours) at 12/11/13 0900 Last data filed at 12/11/13 0900  Gross per 24 hour  Intake    585 ml  Output    600 ml  Net    -15 ml      LBM: 12-10-13     Physical Exam:  General: chronically ill appearing, weak and frail  HEENT: MM, no exudate  Chest: Distant BS, scattered RH  CVS: RRR  Abdomen: soft NT +BS Ext: Without edema  Neuro:alert and oriented  Labs: CBC    Component Value Date/Time  WBC 12.1* 12/11/2013 0306   RBC 3.85* 12/11/2013 0306   RBC 3.89 01/28/2012 1324   HGB 12.3 12/11/2013 0306   HCT 39.8 12/11/2013 0306   PLT 255 12/11/2013 0306   MCV 103.4* 12/11/2013 0306   MCH 31.9 12/11/2013 0306   MCHC 30.9 12/11/2013 0306   RDW 14.3 12/11/2013 0306   LYMPHSABS 1.0 11/26/2013 1250   MONOABS 1.3* 11/24/2013 1250   EOSABS 0.0 12/02/2013 1250   BASOSABS 0.0 11/12/2013 1250    BMET    Component Value Date/Time   NA 139 12/11/2013 0306   K 5.1 12/11/2013 0306   CL 98 12/11/2013 0306   CO2 36* 12/11/2013 0306   GLUCOSE 126* 12/11/2013 0306   BUN 23 12/11/2013 0306   CREATININE 0.83 12/11/2013 0306   CALCIUM 8.9 12/11/2013 0306   GFRNONAA 70* 12/11/2013 0306   GFRAA 81* 12/11/2013 0306    CMP     Component Value Date/Time   NA 139 12/11/2013 0306   K 5.1 12/11/2013 0306   CL 98 12/11/2013 0306   CO2 36* 12/11/2013 0306    GLUCOSE 126* 12/11/2013 0306   BUN 23 12/11/2013 0306   CREATININE 0.83 12/11/2013 0306   CALCIUM 8.9 12/11/2013 0306   PROT 6.3 12/04/2013 0312   ALBUMIN 2.5* 12/04/2013 0312   AST 19 12/04/2013 0312   ALT 38* 12/04/2013 0312   ALKPHOS 150* 12/04/2013 0312   BILITOT 0.6 12/04/2013 0312   GFRNONAA 70* 12/11/2013 0306   GFRAA 81* 12/11/2013 0306     1 Assessment and Plan: 1. Code Status:DNR/DNI 2. Symptom Control:  1. Anxiety/Agitation:Xanax .25 mg PO tid prn 2. Pain: Fentanly patch 75 mcg                                   Percocet 5-325 mg PO every 4 hrs prn                                    Dilaudid- 0.25- 1mg  IV every 4 hrs prn  3. Bowel Regimen: Dulcolax supp prn daily (patietn tells me this is what she uses at home) 4. Weakness: PT/OT as tolerated   3. Psycho/Social:  Emotional support offered to husband at bedside, he has lots of questions regarding anticipatory care needs for his wife.  4. Disposition:  Dependant on outcomes  Patient Documents Completed or Given: Document Given Completed  Advanced Directives Pkt    MOST yes   DNR    Gone from My Sight    Hard Choices yes     Time In Time Out Total Time Spent with Patient Total Overall Time  0830 0905 35 min 35 min    Greater than 50%  of this time was spent counseling and coordinating care related to the above assessment and plan.  Wadie Lessen NP  Palliative Medicine Team Team Phone # [phone] Pager [phone]  Discussed with Sallye Ober NP 1

## 2013-12-11 NOTE — Progress Notes (Signed)
PT Cancellation Note  Patient Details Name: Katherine Mckay MRN: [id number] DOB: Jun 19, 1941   Cancelled Treatment:    Reason Eval/Treat Not Completed: Medical issues which prohibited therapy--will check back another day when pt is appropriate for activity with PT. Thanks.    Weston Anna, MPT Pager: [phone]

## 2013-12-11 NOTE — Progress Notes (Signed)
Spoke with patient and family at length for around 30 minutes about risks of aspiration and worries about patient possibly aspirating. Patient very upset about patient not being able to eat. Family and patient now okay being NPO for a day or two until . Patient and husband now understanding that patient may be aspirating and could potentially get worse if patient does aspirate but still very frustrated about situation. Family and patient willing to wait for swallow evaluation

## 2013-12-11 NOTE — Progress Notes (Signed)
PULMONARY / CRITICAL CARE MEDICINE   Name: Katherine Mckay MRN: [id number] DOB: 05-08-1941    ADMISSION DATE:  11/30/2013  REFERRING MD :  ER  CHIEF COMPLAINT:  Short of breath  BRIEF PATIENT DESCRIPTION:   73 y/o female with hypoxia, wheezing and pneumonia.  She has hx of COPD on chronic steroids (pred 10 mg/day), bronchiectasis, and home oxygen.  She was recently treated for pneumonia.  Pulmonary Hx:  11/10/12 - Sputum >> Pseudomonas (pan sensitive) 11/17/12 - CT chest >> centrilobular emphysema, mild BTX 12/09/12 - PFT >> FEV1 0.94 (51%), FEV1% 45, TLC 5.09 (115%), DLCO 41%  SIGNIFICANT EVENTS: 4/23 - Admit with hypoxia, wheezing & PNA 4/28 - weak, poor secretion clearance 4/29 - Remains very weak with poor cough. Desires to forgo chest PT due to discomfort 4/30 - palliative care consult performed. Full DNR. Pt even wishes to forgo NPPV. DC planning with Hospice initiate. Might require residential Hospice facility 5/01 - weak, but voice a little stronger, remains on 100% NRB, RLL mildly better aerated on CXR  STUDIES:    LINES / TUBES: PIV  CULTURES: Blood 4/23 >> neg Sputum 4/25 > abundant GNR>>psuedomonas ss primaxin, res to cipro  ANTIBIOTICS: Zithromax 4/23 >> 4/26 Azactam 4/23 >>4/26 Vancomycin 4/23 >> 4/25 Imipenem 4/26 >>   SUBJECTIVE:  No acute events.  Resting in bed.    VITAL SIGNS: Temp:  [97.2 F (36.2 C)-97.9 F (36.6 C)] 97.2 F (36.2 C) (05/01 0800) Pulse Rate:  [36-90] 73 (05/01 1000) Resp:  [13-32] 17 (05/01 1000) BP: (78-137)/(39-73) 95/55 mmHg (05/01 1000) SpO2:  [75 %-99 %] 99 % (05/01 1000) FiO2 (%):  [50 %-100 %] 100 % (04/30 1915) Weight:  [121 lb 0.5 oz (54.9 kg)] 121 lb 0.5 oz (54.9 kg) (05/01 0400)  INTAKE / OUTPUT: Intake/Output     04/30 0701 - 05/01 0700 05/01 0701 - 05/02 0700   P.O. 145    I.V. (mL/kg) 130 (2.4) 50 (0.9)   IV Piggyback 400 100   Total Intake(mL/kg) 675 (12.3) 150 (2.7)   Urine (mL/kg/hr) 950 (0.7)    Total  Output 950     Net -275 +150        Urine Occurrence 1 x    Stool Occurrence 3 x      PHYSICAL EXAMINATION: General: Frail, weak, no acute resp distress HEENT: WNL PULM: diminished throughout, few scattered rhonchi, no wheezes noted, severe kyphosis  CV: RRR s M AB: BS+, soft, nontender Ext: warm, trace symmetric edema Neuro: No focal deficits, diffusely weak  LABS: 5/1 labs reviewed.   CXR: 5/1 - bilateral airspace disease / increased edema pattern, + effusions bilat, mild improvement in RLL aeration   ASSESSMENT / PLAN:  PULMONARY A: Acute on chronic respiratory failure - multifactorial  Suspected HCAP AECOPD - wheezing has resolved Bronchiectasis flare Possible mucus plugging  Restrictive Lung Disease - severe kyphosis At Risk Aspiration - secondary to severe deconditioning  P:   Continue baseline steroid dosing Cont nebulized BDs Nebulized steroids Cont abx  Mucomyst - completed 3 days May need to consider comfort feeding / accept risk of aspiration if significant dysphagia noted with swallow eval  CARDIOVASCULAR A:  Hx of HTN, hyperlipidemia. Hx of PE on chronic rivaroxaban as outpt P:  Continue norvasc, lipitor, xarelto   RENAL A:   No acute issues P:   Monitor BMET intermittently Monitor I/Os Correct electrolytes as indicated  GASTROINTESTINAL A:   Protein calorie malnutrition Hx of Crohn's disease  Chronic H2RB therapy Concern for Dysphagia - in setting of generalized weakness P:   SUP: PO famotidine Cont PO nutrition Swallow evaluation pending  HEMATOLOGIC A:   H/O VTE P:  DVT px: full anticoagulation (Rivaroxaban) Monitor CBC intermittently Transfuse per ICU guidelines  INFECTIOUS A:   GNR HCAP Hx of Pseudomonas Chronic steroids P:   Micro and abx as above  ENDOCRINE A:   Steroid induced hyperglycemia - resolved  No prior hx of DM Hx of Osteoporosis.  P:   Cont systemic steroids @ baseline dose Monitor glu on chem  panels   NEUROLOGIC A:   Hx of Anxiety, depression. Chronic neuropathic pain. P:   Continue xanax, fentanyl for pain, neurontin, dilaudid  GLOBAL: -DNR / DNI -continue medical treatment  -if declines, comfort focused care -Palliative Care following, appreciate input -PT/OT -hold floor transfer with increased O2 needs for now    [name], NP-C [place] Pgr: [phone] or [phone]   12/11/2013, 11:30 AM   PCCM ATTENDING: I have interviewed and examined the patient and reviewed the database. I have formulated the assessment and plan as reflected in the note above with amendments made by me.   Merton Border, MD;  PCCM service; Mobile [phone]

## 2013-12-11 NOTE — Progress Notes (Signed)
Patient's Husband requested to speak with me about patient not being able to eat or drink d/t NPO status pending swallow evaluation. Husband "disappointed" that we are not "allowing" wife to eat or drink, especially since she was eating and drinking very little prior to NPO status that was effective this AM. Education provided about worry for possible aspiration and high amount of oxygen the patient was requiring at this time. Husband expressed to me how he felt the hospital was not using "common sense" by not letting his wife eat or drink. He then brought up that he was not interested in bringing in a lawyer at this time, but would do anything he has to to enable his wife to eat. Husband informed me that he has spoken with Dr. Forde Dandy.  All the above information was shared with patient's nurse, mike, who has also provided education for aspiration risk of the patient to her husband.

## 2013-12-11 DEATH — deceased

## 2013-12-12 ENCOUNTER — Inpatient Hospital Stay (HOSPITAL_COMMUNITY): Payer: Medicare Other

## 2013-12-12 MED ORDER — DEXTROSE-NACL 5-0.45 % IV SOLN
INTRAVENOUS | Status: DC
  Administered 2013-12-12: 75 mL via INTRAVENOUS
  Administered 2013-12-13: 20:00:00 via INTRAVENOUS

## 2013-12-12 NOTE — Evaluation (Signed)
Clinical/Bedside Swallow Evaluation Patient Details  Name: Katherine Mckay MRN: [id number] Date of Birth: 02-18-1941  Today's Date: 12/12/2013 Time: 1200-1230 SLP Time Calculation (min): 30 min  Past Medical History:  Past Medical History  Diagnosis Date  . Polio   . Osteoporosis     multiple compression fractures  . Crohn's disease     since 1966 on chronic steroids s/p ileal resection, fistula repair  . Brain tumor   . Migraines   . Hypercholesteremia   . Hypertension   . GERD (gastroesophageal reflux disease)   . Anxiety   . Depression   . Emphysema   . Macular degeneration   . Cataract   . Arthritis   . Pernicious anemia   . Primary adrenal deficiency   . COPD (chronic obstructive pulmonary disease)   . Neuropathy, idiopathic   . Anticoagulated on warfarin     Hx of DVT, PE  . Deafness in right ear    Past Surgical History:  Past Surgical History  Procedure Laterality Date  . Cholecystectomy    . Cataract repair    . Ileal resection    . Hemicolectomy      rt due to crohns  . Colonoscopy  08/17/2011    Procedure: COLONOSCOPY;  Surgeon: Landry Dyke, MD;  Location: [facility];  Service: Endoscopy;  Laterality: N/A;  . Givens capsule study  09/27/2011    Procedure: GIVENS CAPSULE STUDY;  Surgeon: Landry Dyke, MD;  Location: [facility];  Service: Endoscopy;  Laterality: N/A;   HPI:  73 y/o female with hypoxia, wheezing and pneumonia.  She has hx of COPD on chronic steroids (pred 10 mg/day), bronchiectasis, and home oxygen.  She was recently treated for pneumonia.  BSE ordered to assess risk for aspiration due to reports of increased coughing during meals.  MBS completed 11/24/12 indicates functional oropharyngeal dysphagia with suspected primary esophageal deficits with high possibility of LPR.   Diet recommendations made from MBS dysphagia 3 (mechanical soft) and thin liquids with aspiration and reflux precautions (drink warm temp or warm liquids).  Per MBS  report patient stated that she "eats standing up" and usually eats cake as it is easy to swallow.    Assessment / Plan / Recommendation Clinical Impression  BSE completed but limited to trials of puree and thin water by teaspoon due to current respiratory status.  Non-rebreather mask in place.  In addition, RT applied oxygen nasal cannula at 6 L prior to administering PO trials .  Patient with +s/s of suspected aspiration vs. Reflux s/p swallow of thin liquids even in modified amounts.  Patient tolerated small amounts of puree with no outward clinical s/s of aspiration or change in vital signs.  Nasal cannula removed at end of evaluation with non-rebreather mask in place.  Due to current respiratory status, current weakness, and s/s present recommend continued NPO status with exception of medication crushed in puree in limited amounts.  Recommend oral care with use of oral suction QID due to noted xerostomia.  Aware of palliative care consult pending.  ST to follow on 12/13/13 for POC and determine if further objective testing warranted.  Aspiration Risk  Severe    Diet Recommendation NPO;NPO except meds   Medication Administration: Crushed with puree    Other  Recommendations Oral Care Recommendations: Oral care Q4 per protocol   Follow Up Recommendations  Other (comment) (TBD)    Frequency and Duration min 2x/week  2 weeks  SLP Swallow Goals Please refer to Care Plans for listed goals   Swallow Study Prior Functional Status       General Date of Onset: 01-Jan-2014 HPI: 73 y/o female with hypoxia, wheezing and pneumonia.  She has hx of COPD on chronic steroids (pred 10 mg/day), bronchiectasis, and home oxygen.  She was recently treated for pneumonia. Type of Study: Bedside swallow evaluation Diet Prior to this Study: NPO Respiratory Status: non-rebreather Behavior/Cognition: Alert;Cooperative;Pleasant mood Oral Cavity - Dentition: Adequate natural dentition Self-Feeding Abilities:  Total assist Patient Positioning: Upright in bed Baseline Vocal Quality: Hoarse;Clear Volitional Cough: Weak;Wet;Congested Volitional Swallow: Able to elicit    Oral/Motor/Sensory Function Overall Oral Motor/Sensory Function: Appears within functional limits for tasks assessed   Ice Chips Ice chips: Not tested   Thin Liquid Thin Liquid: Impaired Presentation: Spoon;Cup Pharyngeal  Phase Impairments: Suspected delayed Swallow;Decreased hyoid-laryngeal movement;Cough - Delayed;Change in Vital Signs    Nectar Thick Nectar Thick Liquid: Not tested   Honey Thick Honey Thick Liquid: Not tested   Puree Puree: Impaired Presentation: Spoon Pharyngeal Phase Impairments: Suspected delayed Swallow;Multiple swallows   Solid   GO    Solid: Not tested      Sharman Crate [place], CCC-SLP [phone] Chryl Heck Tedd Cottrill 12/12/2013,2:19 PM

## 2013-12-12 NOTE — Progress Notes (Signed)
Subjective: Patient is actually more alert compared only to prior notes conversant and very clear about wishes. Regarding the speech issue and swallowing issues she wants to the right thing to maximize her interventions and care. She seems comfortable on the rebreather.  Objective: Vital signs in last 24 hours: Temp:  [96.3 F (35.7 C)-98.1 F (36.7 C)] 97.6 F (36.4 C) (05/02 0800) Pulse Rate:  [66-82] 82 (05/02 0400) Resp:  [16-26] 22 (05/02 0400) BP: (95-169)/(53-84) 136/55 mmHg (05/02 0400) SpO2:  [80 %-99 %] 96 % (05/02 0800) FiO2 (%):  [55 %-100 %] 100 % (05/02 0400) Weight:  [56.2 kg (123 lb 14.4 oz)] 56.2 kg (123 lb 14.4 oz) (05/02 0400) Weight change: 1.3 kg (2 lb 13.9 oz)  CBG (last 3)   Recent Labs  12/10/13 0804 12/10/13 1156 12/10/13 1600  GLUCAP 128* 122* 114*    Intake/Output from previous day: 05/01 0701 - 05/02 0700 In: 640 [I.V.:240; IV Piggyback:400] Out: 500 [Urine:500]  Physical Exam: Patient is awake alert conversing with her rebreather head of the bed at 45 No JVD or bruits Chest relatively clear no rhonchi diminished breath sounds throughout prolonged expiratory phase Heart sounds distant regular Abdomen benign No edema Neuro awake alert conversant moves extremities x4  Lab Results:  Recent Labs  12/11/13 0306  NA 139  K 5.1  CL 98  CO2 36*  GLUCOSE 126*  BUN 23  CREATININE 0.83  CALCIUM 8.9   No results found for this basename: AST, ALT, ALKPHOS, BILITOT, PROT, ALBUMIN,  in the last 72 hours  Recent Labs  12/11/13 0306  WBC 12.1*  HGB 12.3  HCT 39.8  MCV 103.4*  PLT 255   Lab Results  Component Value Date   INR 1.07 12/09/2013   INR 1.70* 11/15/2013   INR 1.69* 01/28/2012   No results found for this basename: CKTOTAL, CKMB, CKMBINDEX, TROPONINI,  in the last 72 hours No results found for this basename: TSH, T4TOTAL, FREET3, T3FREE, THYROIDAB,  in the last 72 hours No results found for this basename: VITAMINB12, FOLATE,  FERRITIN, TIBC, IRON, RETICCTPCT,  in the last 72 hours  Studies/Results: Dg Chest Port 1 View  12/12/2013   CLINICAL DATA:  Assess airspace disease.  EXAM: PORTABLE CHEST - 1 VIEW  COMPARISON:  12/11/2013  FINDINGS: The patient is rotated to the right. Cardiac silhouette is partially obscured without gross interval change. Pulmonary vascular congestion has mildly decreased. Bilateral pleural effusions are similar to the prior study. Bibasilar parenchymal lung opacities do not appear significantly changed.  IMPRESSION: Unchanged bilateral pleural effusions and bibasilar atelectasis. Slightly decreased pulmonary vascular congestion.   Electronically Signed   By: Logan Bores   On: 12/12/2013 07:55   Dg Chest Port 1 View  12/11/2013   CLINICAL DATA:  Edema.  EXAM: PORTABLE CHEST - 1 VIEW  COMPARISON:  12/10/2013  FINDINGS: Single view of the chest was obtained. Bibasilar densities are suggestive for bilateral pleural effusions and atelectasis. Hazy densities in the right lung have slightly decreased. Heart size remains within normal limits. Again noted are bilateral rib fractures.  IMPRESSION: Minimal change in the bilateral pleural effusions and presumed pulmonary edema.   Electronically Signed   By: Markus Daft M.D.   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 I made myself quite clear with him and he seemed appreciative. I explained that we had 2 options we had to make decisions based upon patient wishes and she was certainly competent this morning.  Option #1 was proceeding in a conservative fashion with no risks or limited risks with a swallow eval to guide therapy and this may lead to modified diet, regular diet reason no diet and encourage feeding tube. Option #2 was a strictly palliative approach with comfort feedings but the risks were aspiration pneumonia and death. I did explain that both options were quite appropriate but this is a personal and family choice and is quite clear to me that both husband and wife prefer the first option acknowledging that they may change in mind if this leads to n.p.o. status and feeding tube. I did explain that given this option they declared the past to be no more controversy in his speech therapy and swallow study will be done when I can be done safely and the need to live with this decision unless they change their minds. They seemed appreciative of this and I have engaged nursing to proceed with a swallow eval with results hopefully later today.   LOS: 9 days   Geoffery Lyons 12/12/2013, 9:04 AM

## 2013-12-12 NOTE — Progress Notes (Signed)
Pt continues to be on a non-rebreather mask, Pt saturation dropped to low 80s when placed on 50% venti-mask. Oral care provided as needed, no sign of distress noted.

## 2013-12-12 NOTE — Progress Notes (Signed)
Patient RP:Katherine Mckay      DOB: 08-12-1941      FYT:[id number]  Reviewed events of the day. [name] just received medication for anxiety and is sleeping. Spoke with [name] by phone. Family coming to terms with patients limitations and prognosis. [name] tells me they are moving toward focusing on comfort. They would like to get to Monday when they can renew their wedding vows. Plan to check in with them in the am tomorrow.  Will try to help in anyway we can.  [name] L. Lovena Le, MD MBA The Palliative Medicine Team at [facility] Phone: [phone] Pager: [phone]

## 2013-12-12 NOTE — Progress Notes (Signed)
PT Cancellation Note  Patient Details Name: Katherine Mckay MRN: [id number] DOB: 09-12-1940   Cancelled Treatment:    Reason Eval/Treat Not Completed: Medical issues which prohibited therapy (on NRB mask, )   Claretha Cooper 12/12/2013, 7:57 AM

## 2013-12-12 NOTE — Progress Notes (Signed)
During bedside rounds with night shift RN, patient's husband expressed frustration and disappointment that patient was not able to eat at this time.  Greater than 10 minutes spent listening to his concerns/frustration/anger at the situation.  Stated he "hates to get the law involved, but if I have to, I will call an attorney".  Acknowledged to husband that this was indeed a difficult situation, offered to call MD and SLP this morning.  He expressed satisfaction with this plan.  SLP and MD paged, awaiting response.  Note:  Patient is now requiring 100% NRB to maintain O2 sats >88%.  According to note by Nelva Bush, RN 12/11/13, husband is not willing to do "comfort feeds" at this time.  Will continue to monitor.

## 2013-12-13 DIAGNOSIS — F411 Generalized anxiety disorder: Secondary | ICD-10-CM

## 2013-12-13 DIAGNOSIS — G894 Chronic pain syndrome: Secondary | ICD-10-CM

## 2013-12-13 MED ORDER — HYDROMORPHONE HCL PF 1 MG/ML IJ SOLN
0.2500 mg | INTRAMUSCULAR | Status: DC | PRN
  Administered 2013-12-13 – 2013-12-14 (×7): 1 mg via INTRAVENOUS
  Filled 2013-12-13 (×7): qty 1

## 2013-12-13 MED ORDER — LIDOCAINE 5 % EX PTCH
1.0000 | MEDICATED_PATCH | CUTANEOUS | Status: DC
  Administered 2013-12-13: 1 via TRANSDERMAL
  Filled 2013-12-13: qty 1

## 2013-12-13 MED ORDER — ALPRAZOLAM ER 0.5 MG PO TB24: 0.2500 mg | ORAL_TABLET | Freq: Every day | ORAL | Status: DC | PRN

## 2013-12-13 MED ORDER — ALPRAZOLAM 0.25 MG PO TABS
0.2500 mg | ORAL_TABLET | Freq: Two times a day (BID) | ORAL | Status: DC
  Administered 2013-12-13: 0.25 mg via ORAL
  Filled 2013-12-13: qty 1

## 2013-12-13 MED ORDER — LORAZEPAM 2 MG/ML IJ SOLN
1.0000 mg | INTRAMUSCULAR | Status: DC | PRN
  Administered 2013-12-13 – 2013-12-14 (×4): 1 mg via INTRAVENOUS
  Filled 2013-12-13 (×5): qty 1

## 2013-12-13 MED ORDER — ALPRAZOLAM 0.25 MG PO TABS: 0.2500 mg | ORAL_TABLET | Freq: Every day | ORAL | Status: DC | PRN

## 2013-12-13 MED ORDER — ONDANSETRON HCL 4 MG/2ML IJ SOLN: 4.0000 mg | Freq: Four times a day (QID) | INTRAMUSCULAR | Status: DC | PRN

## 2013-12-13 NOTE — Progress Notes (Signed)
Patient RF:FMBWGY Katherine Mckay      DOB: 1941-08-08      KZL:[id number]   Palliative Medicine Team at [facility] Progress Note    Subjective: Met with Mr.Vilar, emotional support offered.  He is struggling with his wife pain, and suffering.  He wants to go ahead and renew their vows today as he is not sure that she will survive till Monday which is her anniversary.  Mrs. Penland states she is not doing good. Describes continuous pain in her back and neck.  She has displayed anxiety and her husband states that at home she takes her antianxiety medication twice a day scheduled with one prn in between only if she needs one.     Filed Vitals:   12/13/13 0400  BP: 138/63  Pulse: 86  Temp: 97 F (36.1 C)  Resp: 20   Physical exam:  General: no acute distress at present but does indorse pain PERRL, EOMI, anicteric, speech heavily accents and muffled by the mask but otherwise makes sense Chest decreased with no rhonchi, rales or wheezes at present CVS: regular , S1, s2 Abd: soft , no grimace Ext warm no mottling Neuro:  Awakens but is intermittently sleeping.   Assessment and plan: 73 yr old white female with multiple medical problems admitted with hypoxia related to COPD exacerbation.  She continues to do poorly with pain and respiratory decline.  She and her husband would like to opt for a comfort care approach as it appears that she has dysphagia as well.  1.  DNR  2.  Comfort feed: will allow a full liquid diet as her spouse would like her to be able to "taste" thing like coffee if she wants.  He is not going to place a feeding tube.  3.  Anxiety: schedule her xanax bid with ability to have a dose in between which is how she takes it at home.  4.  Pain: add lidocaine patch to neck.  Allow diluadid q 2 hour prn .  Consider scheduling medications as we get her through till tomorrow.  5.  Chaplain has been paged to help them renew their vows.   Discussed with Dr. Reynaldo Minium. Total time:   800-  840 am  Aleana Fifita L. Lovena Le, MD MBA The Palliative Medicine Team at [facility] Phone: [phone] Pager: [phone]

## 2013-12-13 NOTE — Progress Notes (Signed)
Paged by staff at the request of Mr Kukla with the request for the chaplain to officiate at the renewal of he and his wife's wedding vows. Tomorrow is their wedding anniversary, but Mr Carbary is fearful that Ms Donnan will not be able, either through death or weakness to renew their vows. The Diane and Anyjah Roundtree are practicing Christians following the [employer] and polity. They have been estranged from their congregation and have no family of faith presently to perform this ceremony. Mr Neal wrote on the vows they had decided upon. The Chaplain added a introduction and a prayer. The ceremony was short, meaningful and full of joy for both.   Ms Nakayama reports she is weakening and that she is in physical pain. Although calm and responsive during this visit, the family reports she has had some anxiety about being in the hospital and her continued weakness. She is understandably frighten being in the hospital.  RECOMMEND: A chaplain visit in the AM Monday May 4 to check on Ms Deboy and her husband.   Sallee Lange. Oliveah Zwack, DMin, MDiv, MA Chaplain

## 2013-12-13 NOTE — Progress Notes (Signed)
Physical Therapy Discharge Patient Details Name: Katherine Mckay MRN: [id number] DOB: 01/31/1941 Today's Date: 12/13/2013 Time:  - 601    Patient discharged from PT services secondary to Patient continues to be on NRB mask. Palliative Care following, moving toward comfort care.   GP     [address] [place] PT [phone]  12/13/2013, 7:55 AM

## 2013-12-13 NOTE — Progress Notes (Signed)
Subjective: Patient is clearly declined since yesterday more confused less verbal extensive family at the bedside. I did have an extended discussion with both Dr. Lovena Le palliative care as well as husband. He is quite appreciative and we have clearly switch at this point to an strict palliative care approach.  Objective: Vital signs in last 24 hours: Temp:  [96.7 F (35.9 C)-98.3 F (36.8 C)] 97.7 F (36.5 C) (05/03 0800) Pulse Rate:  [73-88] 80 (05/03 0800) Resp:  [17-22] 17 (05/03 0800) BP: (94-167)/(52-80) 149/69 mmHg (05/03 0800) SpO2:  [85 %-99 %] 99 % (05/03 0800) FiO2 (%):  [100 %] 100 % (05/03 0815) Weight change:   CBG (last 3)   Recent Labs  12/10/13 1156 12/10/13 1600  GLUCAP 122* 114*    Intake/Output from previous day: 05/02 0701 - 05/03 0700 In: 1120 [I.V.:720; IV Piggyback:400] Out: 151 [Urine:775]  Physical Exam: Patient is awake alert but staring less verbal not as bright as yesterday a bit more air hungry No JVD or bruits Bilateral rhonchi Heart sounds distant Abdomen is benign No edema Patient is awake not conversant and lying still   Lab Results:  Recent Labs  12/11/13 0306  NA 139  K 5.1  CL 98  CO2 36*  GLUCOSE 126*  BUN 23  CREATININE 0.83  CALCIUM 8.9   No results found for this basename: AST, ALT, ALKPHOS, BILITOT, PROT, ALBUMIN,  in the last 72 hours  Recent Labs  12/11/13 0306  WBC 12.1*  HGB 12.3  HCT 39.8  MCV 103.4*  PLT 255   Lab Results  Component Value Date   INR 1.07 11/28/2013   INR 1.70* 11/15/2013   INR 1.69* 01/28/2012   No results found for this basename: CKTOTAL, CKMB, CKMBINDEX, TROPONINI,  in the last 72 hours No results found for this basename: TSH, T4TOTAL, FREET3, T3FREE, THYROIDAB,  in the last 72 hours No results found for this basename: VITAMINB12, FOLATE, FERRITIN, TIBC, IRON, RETICCTPCT,  in the last 72 hours  Studies/Results: Dg Chest Port 1 View  12/12/2013   CLINICAL DATA:  Assess airspace  disease.  EXAM: PORTABLE CHEST - 1 VIEW  COMPARISON:  12/11/2013  FINDINGS: The patient is rotated to the right. Cardiac silhouette is partially obscured without gross interval change. Pulmonary vascular congestion has mildly decreased. Bilateral pleural effusions are similar to the prior study. Bibasilar parenchymal lung opacities do not appear significantly changed.  IMPRESSION: Unchanged bilateral pleural effusions and bibasilar atelectasis. Slightly decreased pulmonary vascular congestion.   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 We'll simplify regimen focused on palliative interventions alone. Comfort feet as family desires. GI P. status tomorrow   LOS: 10 days   Geoffery Lyons 12/13/2013, 11:34 AM

## 2013-12-13 NOTE — Progress Notes (Signed)
SLP Cancellation Note  Patient Details Name: WADIE MATTIE MRN: [id number] DOB: 02/27/1941   Cancelled treatment:  ST to sign off as GOC established and patient comfort care status.   Sharman Crate [place], [place] Marcille Buffy 12/13/2013, 10:15 AM

## 2013-12-14 DIAGNOSIS — R5383 Other fatigue: Secondary | ICD-10-CM

## 2013-12-14 DIAGNOSIS — R5381 Other malaise: Secondary | ICD-10-CM

## 2013-12-14 DIAGNOSIS — Z515 Encounter for palliative care: Secondary | ICD-10-CM

## 2013-12-14 MED ORDER — SODIUM CHLORIDE 0.9 % IV SOLN
0.5000 mg/h | INTRAVENOUS | Status: DC
  Administered 2013-12-14: 0.5 mg/h via INTRAVENOUS
  Filled 2013-12-14: qty 5

## 2013-12-14 MED ORDER — SCOPOLAMINE 1 MG/3DAYS TD PT72
1.0000 | MEDICATED_PATCH | TRANSDERMAL | Status: DC
  Administered 2013-12-14: 1.5 mg via TRANSDERMAL
  Filled 2013-12-14: qty 1

## 2013-12-14 MED ORDER — LORAZEPAM 2 MG/ML IJ SOLN: 1.0000 mg | INTRAMUSCULAR | Status: DC | PRN
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
## 2013-12-14 NOTE — Progress Notes (Signed)
Subjective: Not responsive, some movement that indicates some discomfort it seems.    Objective: Vital signs in last 24 hours: Temp:  [97.7 F (36.5 C)-99.2 F (37.3 C)] 99.2 F (37.3 C) (05/04 0400) Pulse Rate:  [76-111] 88 (05/04 0800) Resp:  [18-32] 28 (05/04 0800) BP: (106-181)/(58-89) 106/58 mmHg (05/04 0800) SpO2:  [76 %-97 %] 96 % (05/04 0800) FiO2 (%):  [100 %] 100 % (05/04 0800) Weight:  [56.2 kg (123 lb 14.4 oz)] 56.2 kg (123 lb 14.4 oz) (05/04 0400)  Intake/Output from previous day: 05/03 0701 - 05/04 0700 In: 1900 [I.V.:1800; IV Piggyback:100] Out: -  Intake/Output this shift: Total I/O In: 75 [I.V.:75] Out: -   Lying on side. Face mask in place. Lungs distant, no wheeze, non verbal  Lab Results     Studies/Results: No results found.  Scheduled Meds: . fentaNYL  75 mcg Transdermal QODAY  . ipratropium-albuterol  3 mL Nebulization Q6H   Continuous Infusions: . dextrose 5 % and 0.45% NaCl 75 mL/hr at 12/13/13 2023   PRN Meds:bisacodyl, HYDROmorphone (DILAUDID) injection, LORazepam, ondansetron (ZOFRAN) IV, polyethylene glycol, sodium chloride HYPERTONIC  Assessment/Plan: END STAGE COPD: start dilaudid drip, remove fentanyl, increase ativan frequency PNEUMONIA: Off abx DNR   LOS: 11 days   [place] 12/14/2013, 8:43 AM

## 2013-12-14 NOTE — Progress Notes (Signed)
Progress Note from the Palliative Medicine Team at [facility]:  -patient is minimally responsive--focus of care has shifted to a full comfort appraoch  -husband at bedside, continued conversation regarding care options as it relates to comfort  -we discussed natural trajectory and expectations at end of life,  Family only hope is  "no suffering"     Objective: Allergies  Allergen Reactions  . Celebrex [Celecoxib] Shortness Of Breath    No voiding  . Penicillins Shortness Of Breath and Rash  . Smz-Tmp Ds [Sulfamethoxazole W/Trimethoprim (Co-Trimoxazole)] Rash  . Ceclor [Cefaclor] Other (See Comments)    Stomach pain  . Ciprofloxacin Hcl     Possible GI Bleed.  . Diclofenac Sodium     Pt doesn't remember reaction  . Doxycycline     Stomach pains.  . Flagyl [Metronidazole Hcl]     Vaginal rash  . Hydrocodone Other (See Comments)    Severe stomach pain  . Iohexol      Code: HIVES, Desc: pt states she breaks out in hives and sneezes 10/01/08   Desc: STATES SHE ONLY REACTS BY SNEEZING   . Other Other (See Comments)    Darvocet - liver damage  . Percocet [Oxycodone-Acetaminophen] Other (See Comments)    Liver damage  . Prilosec [Omeprazole] Other (See Comments)    Causes ulcers in stomach and esophagus  . Tylenol [Acetaminophen] Other (See Comments)    Liver damage   Scheduled Meds: . scopolamine  1 patch Transdermal Q72H   Continuous Infusions: . dextrose 5 % and 0.45% NaCl 75 mL/hr at 12/13/13 2023  . HYDROmorphone     PRN Meds:.bisacodyl, HYDROmorphone (DILAUDID) injection, LORazepam, ondansetron (ZOFRAN) IV, sodium chloride HYPERTONIC  BP 106/58  Pulse 88  Temp(Src) 99.2 F (37.3 C) (Axillary)  Resp 28  Ht 5\' 3"  (1.6 m)  Wt 56.2 kg (123 lb 14.4 oz)  BMI 21.95 kg/m2  SpO2 96%   PPS: 20%    Intake/Output Summary (Last 24 hours) at 12/14/13 0850 Last data filed at 12/14/13 0800  Gross per 24 hour  Intake   1900 ml  Output      0 ml  Net    1900 ml      LBM: 12-10-13     Physical Exam:  General:minimally responsive, transitioning at EOL HEENT: MM, no exudate  Chest: Distant BS, scattered RH  CVS: RRR  Skin: cool and mottling feet and knees noted Abdomen: soft NT +BS Ext: Without edema  Neuro- minimally responsive   Labs: CBC    Component Value Date/Time   WBC 12.1* 12/11/2013 0306   RBC 3.85* 12/11/2013 0306   RBC 3.89 01/28/2012 1324   HGB 12.3 12/11/2013 0306   HCT 39.8 12/11/2013 0306   PLT 255 12/11/2013 0306   MCV 103.4* 12/11/2013 0306   MCH 31.9 12/11/2013 0306   MCHC 30.9 12/11/2013 0306   RDW 14.3 12/11/2013 0306   LYMPHSABS 1.0 2014-01-01 1250   MONOABS 1.3* 2014/01/01 1250   EOSABS 0.0 01-01-2014 1250   BASOSABS 0.0 01-01-14 1250    BMET    Component Value Date/Time   NA 139 12/11/2013 0306   K 5.1 12/11/2013 0306   CL 98 12/11/2013 0306   CO2 36* 12/11/2013 0306   GLUCOSE 126* 12/11/2013 0306   BUN 23 12/11/2013 0306   CREATININE 0.83 12/11/2013 0306   CALCIUM 8.9 12/11/2013 0306   GFRNONAA 70* 12/11/2013 0306   GFRAA 81* 12/11/2013 0306    CMP  Component Value Date/Time   NA 139 12/11/2013 0306   K 5.1 12/11/2013 0306   CL 98 12/11/2013 0306   CO2 36* 12/11/2013 0306   GLUCOSE 126* 12/11/2013 0306   BUN 23 12/11/2013 0306   CREATININE 0.83 12/11/2013 0306   CALCIUM 8.9 12/11/2013 0306   PROT 6.3 12/04/2013 0312   ALBUMIN 2.5* 12/04/2013 0312   AST 19 12/04/2013 0312   ALT 38* 12/04/2013 0312   ALKPHOS 150* 12/04/2013 0312   BILITOT 0.6 12/04/2013 0312   GFRNONAA 70* 12/11/2013 0306   GFRAA 81* 12/11/2013 0306     1 Assessment and Plan: 1. Code Status:DNR/DNI 2. Symptom Control:  1. Anxiety/Agitation: Ativan IV 1mg  every 3 hrs prn 2. Pain: D/CFentanly patch 75 mcg                                 Dilaudid-start continuous gtt 0.5 mg/hr and may titrate to comfort 3. Bowel Regimen: Dulcolax supp prn daily (patietn tells me this is what she uses at home) 4. Terminal secretions: Scopolamine patch as directed   3. Psycho/Social:   Emotional support offered to husband at bedside, he has lots of questions regarding anticipatory care needs for his wife. Meet with son and daughter at bedside, questions and concerns addressed  4. Disposition:  Dependant on outcomes, prognosis is likely hrs to days, expect a hospital death  Patient Documents Completed or Given: Document Given Completed  Advanced Directives Pkt    MOST yes   DNR    Gone from My Sight    Hard Choices yes     Time In Time Out Total Time Spent with Patient Total Overall Time  0810 0850 40 min 40 min    Greater than 50%  of this time was spent counseling and coordinating care related to the above assessment and plan.  Wadie Lessen NP  Palliative Medicine Team Team Phone # [phone] Pager [phone]  Discussed with Dr Forde Dandy 1

## 2013-12-14 NOTE — Progress Notes (Signed)
PULMONARY / CRITICAL CARE MEDICINE   Name: Katherine Mckay MRN: [id number] DOB: 07/11/1941    ADMISSION DATE:  12/08/2013  REFERRING MD :  ER  CHIEF COMPLAINT:  Short of breath  BRIEF PATIENT DESCRIPTION:   73 y/o female with hypoxia, wheezing and pneumonia.  She has hx of COPD on chronic steroids (pred 10 mg/day), bronchiectasis, and home oxygen.  She was recently treated for pneumonia.  Pulmonary Hx:  11/10/12 - Sputum >> Pseudomonas (pan sensitive) 11/17/12 - CT chest >> centrilobular emphysema, mild BTX 12/09/12 - PFT >> FEV1 0.94 (51%), FEV1% 45, TLC 5.09 (115%), DLCO 41%  SIGNIFICANT EVENTS: 4/23 - Admit with hypoxia, wheezing & PNA 4/28 - weak, poor secretion clearance 4/29 - Remains very weak with poor cough. Desires to forgo chest PT due to discomfort 4/30 - palliative care consult performed. Full DNR. Pt even wishes to forgo NPPV. DC planning with Hospice initiate. Might require residential Hospice facility 5/01 - weak, but voice a little stronger, remains on 100% NRB, RLL mildly better aerated on CXR   CULTURES: Blood 4/23 >> neg Sputum 4/25 > abundant GNR>>psuedomonas ss primaxin, res to cipro  ANTIBIOTICS: Zithromax 4/23 >> 4/26 Azactam 4/23 >>4/26 Vancomycin 4/23 >> 4/25 Imipenem 4/26 >>   SUBJECTIVE:  Pt declined, transitioned to comfort care.    VITAL SIGNS: Temp:  [97.7 F (36.5 C)-99.2 F (37.3 C)] 99.2 F (37.3 C) (05/04 0400) Pulse Rate:  [76-111] 88 (05/04 0800) Resp:  [18-32] 28 (05/04 0800) BP: (106-181)/(58-89) 106/58 mmHg (05/04 0800) SpO2:  [76 %-97 %] 96 % (05/04 0800) FiO2 (%):  [100 %] 100 % (05/04 0800) Weight:  [123 lb 14.4 oz (56.2 kg)] 123 lb 14.4 oz (56.2 kg) (05/04 0400)  INTAKE / OUTPUT: Intake/Output     05/03 0701 - 05/04 0700 05/04 0701 - 05/05 0700   I.V. (mL/kg) 1800 (32) 75 (1.3)   IV Piggyback 100    Total Intake(mL/kg) 1900 (33.8) 75 (1.3)   Urine (mL/kg/hr)     Total Output       Net +1900 +75        Urine Occurrence  5 x    Stool Occurrence 4 x      PHYSICAL EXAMINATION: General: Frail, weak, no acute resp distress HEENT: WNL PULM: diminished throughout, few scattered rhonchi, no wheezes noted, severe kyphosis  CV: RRR s M AB: BS+, soft, nontender Ext: warm, trace symmetric edema Neuro: No focal deficits, diffusely weak  LABS: 5/1 labs reviewed.   CXR: 5/2 - bilateral airspace disease / increased edema pattern, + effusions bilat, mild improvement in RLL aeration   ASSESSMENT / PLAN:  PULMONARY A: Acute on chronic respiratory failure - multifactorial  GNR HCAP Hx of Pseudomonas Chronic Steroids AECOPD - wheezing has resolved Bronchiectasis flare Possible mucus plugging  Restrictive Lung Disease - severe kyphosis At Risk Aspiration - secondary to severe deconditioning  P:   Comfort focused care, dilaudid gtt Scopolamine patch for increased secretions DNR/DNI Oxygen for comfort   NEUROLOGIC A:   Hx of Anxiety, depression. Chronic neuropathic pain. P:   Pain control per Palliative Care Ativan for anxiety  PCCM will be available PRN.  Please call if we can be of assistance.   [name], NP-C [facility] Pgr: [phone] or [phone] 12/14/2013, 8:55 AM   PCCM Attending  Plans for comfort care noted - agree completely with this strategy.  Please let us know if we can assist you .   Herbie [place]  Lamonte Sakai, MD, PhD 12/14/2013, 10:04 AM [facility] [phone] or if no answer [phone]

## 2013-12-17 ENCOUNTER — Institutional Professional Consult (permissible substitution): Payer: Medicare Other | Admitting: Pulmonary Disease

## 2013-12-21 NOTE — Progress Notes (Signed)
Pt not in restaints at time of death previous value charted in error by Gladys Damme.

## 2013-12-23 NOTE — Discharge Summary (Signed)
DISCHARGE SUMMARY  Katherine Mckay  MR#: [id number]  DOB:03/19/1941  Date of Admission: Jan 01, 2014 Date of Discharge: 12/23/2013  Attending [place]  Patient's XQJ:JHERD,EYCXKGY Antony Haste, MD  Consults:Treatment Team:  Palliative [facility]  Discharge Diagnoses: Principal Problem:   Acute and chronic respiratory failure Active Problems:   Crohn's disease   Chronic pain syndrome   Recurrent pulmonary embolism   COPD (chronic obstructive pulmonary disease) with emphysema   HCAP (healthcare-associated pneumonia)   Osteoporosis with fracture   Severe protein-calorie malnutrition   Palliative care encounter   Pain in back   Anxiety state, unspecified   Weakness generalized      Hospital Procedures: Dg Chest 2 View  01/01/2014   CLINICAL DATA:  COPD, shortness of breath  EXAM: CHEST  2 VIEW  COMPARISON:  DG CHEST 2 VIEW dated 11/20/2013; DG CHEST 1V PORT dated 11/15/2013; DG CHEST 2 VIEW dated 12/31/2012  FINDINGS: There is hazy bibasilar airspace disease which is less pronounced compared with the prior exams likely rib reflecting resolving pneumonia. There is hazy right upper lobe airspace disease likely reflecting pneumonia. There is no pleural effusion or pneumothorax. Stable cardiomediastinal silhouette. There are multiple old right posterior rib deformities.  IMPRESSION: 1. Bibasilar hazy airspace disease improved compared with the prior exam. Hazy right upper lobe airspace disease more prominent compared with the prior exam. These findings are most compatible with persistent pneumonia.   Electronically Signed   By: Kathreen Devoid   On: 2014/01/01 13:39   Dg Chest Port 1 View  12/12/2013   CLINICAL DATA:  Assess airspace disease.  EXAM: PORTABLE CHEST - 1 VIEW  COMPARISON:  12/11/2013  FINDINGS: The patient is rotated to the right. Cardiac silhouette is partially obscured without gross interval change. Pulmonary vascular congestion has mildly decreased. Bilateral pleural  effusions are similar to the prior study. Bibasilar parenchymal lung opacities do not appear significantly changed.  IMPRESSION: Unchanged bilateral pleural effusions and bibasilar atelectasis. Slightly decreased pulmonary vascular congestion.   Electronically Signed   By: Logan Bores   On: 12/12/2013 07:55   Dg Chest Port 1 View  12/11/2013   CLINICAL DATA:  Edema.  EXAM: PORTABLE CHEST - 1 VIEW  COMPARISON:  12/10/2013  FINDINGS: Single view of the chest was obtained. Bibasilar densities are suggestive for bilateral pleural effusions and atelectasis. Hazy densities in the right lung have slightly decreased. Heart size remains within normal limits. Again noted are bilateral rib fractures.  IMPRESSION: Minimal change in the bilateral pleural effusions and presumed pulmonary edema.   Electronically Signed   By: Markus Daft M.D.   On: 12/11/2013 07:50   Dg Chest Port 1 View  12/10/2013   CLINICAL DATA:  Pneumonia  EXAM: PORTABLE CHEST - 1 VIEW  COMPARISON:  DG CHEST 1V PORT dated 12/09/2013; DG CHEST 1V PORT dated 12/06/2013; DG CHEST 1V PORT dated 12/04/2013; DG CHEST 2 VIEW dated 11/20/2013; DG CHEST 1V PORT dated 11/15/2013  FINDINGS: Grossly unchanged enlarged cardiac silhouette and mediastinal contours with slight differences attributable to patient rotation and persistent kyphotic projection. The lungs remain hyperexpanded. The pulmonary vasculature remains indistinct with cephalization of flow. Layering bilateral effusions appear unchanged to slightly increased in the interval with associated bilateral mid and lower lung opacities, right greater than left. Grossly unchanged bones including old bulb fractures involving the lateral aspects of the right 4th, 5th and 6th ribs.  IMPRESSION: 1. Findings suggestive of worsening pulmonary edema superimposed on advanced emphysematous change. 2. Interval development  of small bilateral effusions with associated bilateral mid and lower lung opacities, atelectasis versus  infiltrate.   Electronically Signed   By: Sandi Mariscal M.D.   On: 12/10/2013 07:40   Dg Chest Port 1 View  12/09/2013   CLINICAL DATA:  assess airspace disease  EXAM: PORTABLE CHEST - 1 VIEW  COMPARISON:  DG CHEST 1V PORT dated 12/06/2013  FINDINGS: The cardiac silhouette is partially obscured and otherwise stable. There is increased conspicuity of the left lower lobe density and interval development of opacity in the right lower lobe. The diffuse interstitial changes are stable. The bones are osteopenic. There is blunting of the costophrenic angles.  IMPRESSION: Progression of the left basilar opacity and interval development of a right basilar opacity, infiltrates versus atelectasis. Small bilateral effusions. Chronic interstitial changes stable.   Electronically Signed   By: Margaree Mackintosh M.D.   On: 12/09/2013 07:31   Dg Chest Port 1 View  12/06/2013   CLINICAL DATA:  COPD, evaluate for infiltrates  EXAM: PORTABLE CHEST - 1 VIEW  COMPARISON:  Prior chest x-ray 12/04/2013  FINDINGS: Patient is rotated to the right. Stable cardiomegaly and mediastinal contours given rotation. Linear artifact overlying the right chest favored to represent skin folds and/or overlying bedding no definite pneumothorax. Severe background chronic pulmonary disease with bronchitic changes and diffuse interstitial prominence. Additionally there appears to be Advanced centrilobular emphysema. Patchy opacity noted in the left lung base. Additional atelectatic changes noted in both lower lobes.  IMPRESSION: 1. Patchy left lower lobe opacity concerning for pneumonia. 2. Background changes of severe COPD and emphysema. 3. Bibasilar atelectasis.   Electronically Signed   By: Jacqulynn Cadet M.D.   On: 12/06/2013 07:33   Dg Chest Port 1 View  12/04/2013   CLINICAL DATA:  Pneumonia  EXAM: PORTABLE CHEST - 1 VIEW  COMPARISON:  12/07/2013  FINDINGS: Progression of bibasilar infiltrates. Right upper lobe infiltrate also shows mild  progression. Findings could be due to pneumonia. Increase in small right effusion.  IMPRESSION: Progression of bilateral airspace disease, most consistent with pneumonia however pulmonary edema could have a similar appearance.   Electronically Signed   By: Franchot Gallo M.D.   On: 12/04/2013 07:01    History of Present Illness: dyspnea  Hospital Course: 73 YO WFwith hx oxygen and steroid dependent end stage COPD, osteoporosis with recent fxs, and chronic FTT. Was admitted to ICU with resp failure. Treated with broad spectrum abx and BIPAP. Intially did fair but then worsened. Consults to pulm and palliative care resulted in comfort care approach. Pt died quietly with husband in attendance, Cause of death was acute on chronic respiratory failure.  Other dx: pneumonia, crohns, anemia, CKD  Day of Discharge Exam BP 70/39  Pulse 28  Temp(Src) 96 F (35.6 C) (Axillary)  Resp 23  Ht 5\' 3"  (1.6 m)  Wt 56.2 kg (123 lb 14.4 oz)  BMI 21.95 kg/m2  SpO2 69%    Discharge Labs:  Results for NOMIE, BUCHBERGER (MRN [id number]) as of 12/23/2013 15:30  Ref. Range 12/11/2013 03:06  Sodium Latest Range: 137-147 mEq/L 139  Potassium Latest Range: 3.7-5.3 mEq/L 5.1  Chloride Latest Range: 96-112 mEq/L 98  CO2 Latest Range: 19-32 mEq/L 36 (H)  BUN Latest Range: 6-23 mg/dL 23  Creatinine Latest Range: 0.50-1.10 mg/dL 0.83  Calcium Latest Range: 8.4-10.5 mg/dL 8.9  GFR calc non Af Amer Latest Range: >90 mL/min 70 (L)  GFR calc Af Amer Latest Range: >90 mL/min 81 (L)  Glucose Latest Range: 70-99 mg/dL 126 (H)  WBC Latest Range: 4.0-10.5 K/uL 12.1 (H)  RBC Latest Range: 3.87-5.11 MIL/uL 3.85 (L)  Hemoglobin Latest Range: 12.0-15.0 g/dL 12.3  HCT Latest Range: 36.0-46.0 % 39.8  MCV Latest Range: 78.0-100.0 fL 103.4 (H)  MCH Latest Range: 26.0-34.0 pg 31.9  MCHC Latest Range: 30.0-36.0 g/dL 30.9  RDW Latest Range: 11.5-15.5 % 14.3  Platelets Latest Range: 150-400 K/uL 255    Lab Results  Component  Value Date   INR 1.07 11/11/2013   INR 1.70* 11/15/2013   INR 1.69* 01/28/2012                    Signed: [place] 12/23/2013, 3:30 PM

## 2014-01-11 NOTE — Progress Notes (Addendum)
Pt expired at 0250. Two RNs verified death- Leopoldo Mazzie Doles-Johnson, RN and Benjamin Stain, RN. 30cc of Hydromorphone was wasted at time of death, verified by same two RNs.  Attempted five times to notify husband, Cornelia Copa, all attempts unsuccessful. Will continue to try to notify husband. Natale Milch, RN  Postmortem care performed, CDS notified and eyes prepped. Natale Milch, RN

## 2014-01-11 DEATH — deceased

## 2014-02-16 ENCOUNTER — Ambulatory Visit: Payer: Medicare Other

## 2015-04-26 IMAGING — CT CT PELVIS W/O CM
2 of 3 series · 17 of 46 positions shown, 19 images · non-contrast
Comparison: CT abdomen and pelvis 08/13/2011.

CLINICAL DATA: Left hip pain. Questions sacral or hip fracture.

EXAM:
CT PELVIS WITHOUT CONTRAST
TECHNIQUE: Multidetector CT imaging of the pelvis was performed following the
standard protocol without intravenous contrast.

[Series 3: pelvis standard · axial · 0.70mm/px · z∈[-202,+33]mm · 14 of 111 slices shown, 16 images]
[im 8/111  soft-tissue]
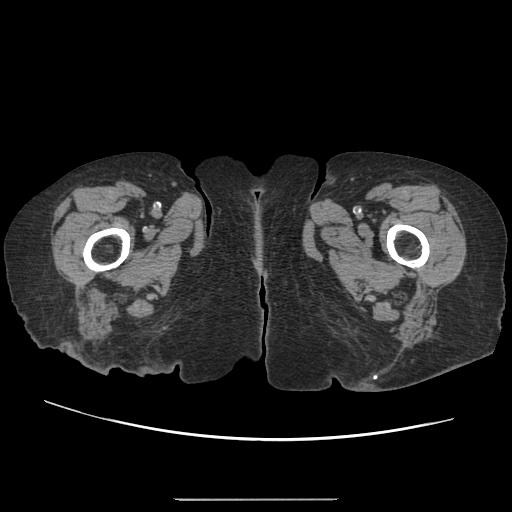
[im 8/111  bone]
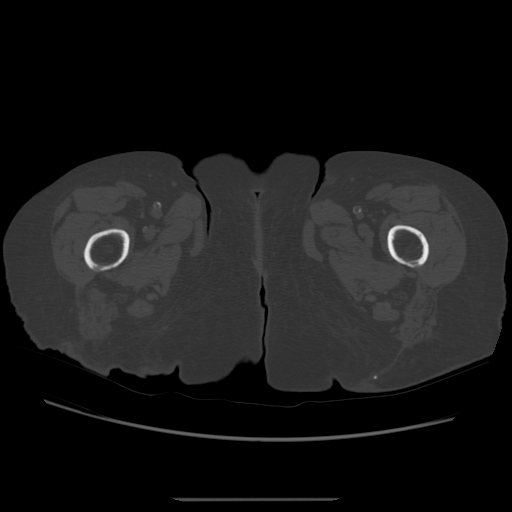
[im 15/111  soft-tissue]
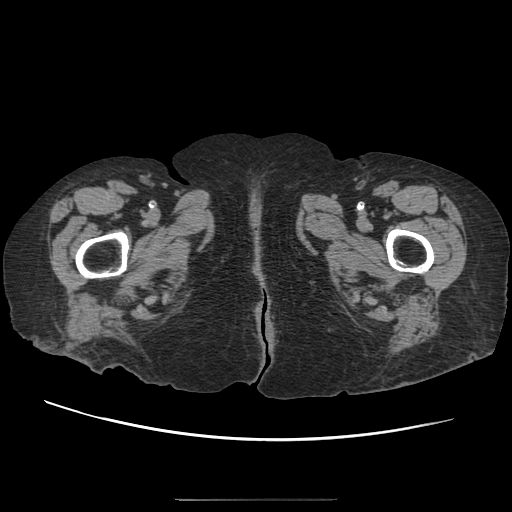
[im 22/111  soft-tissue]
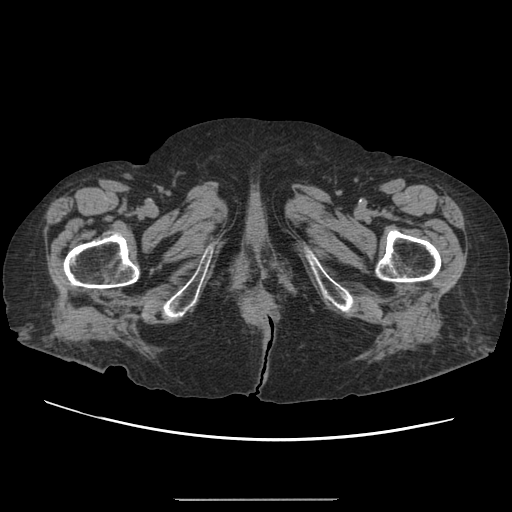
[im 29/111  soft-tissue]
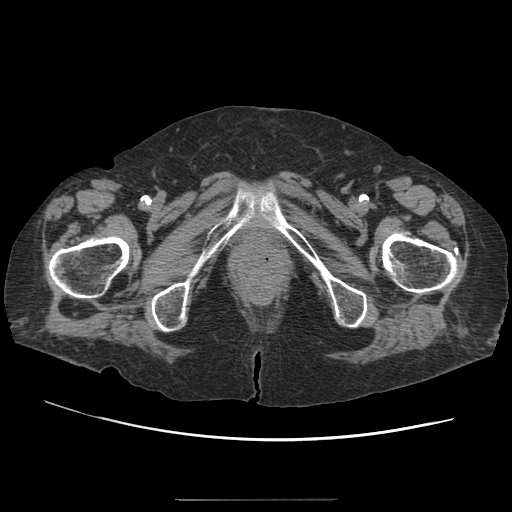
[im 36/111  soft-tissue]
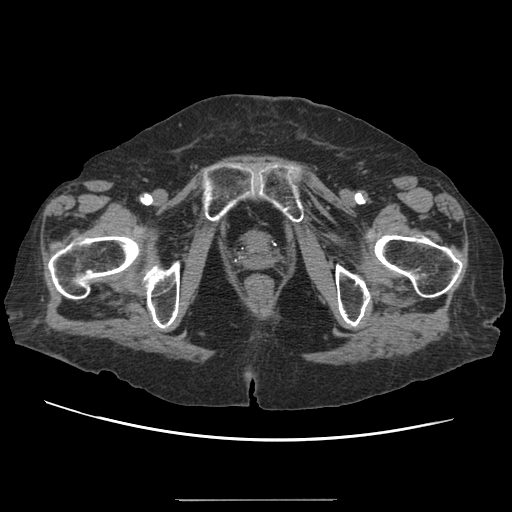
[im 43/111  soft-tissue]
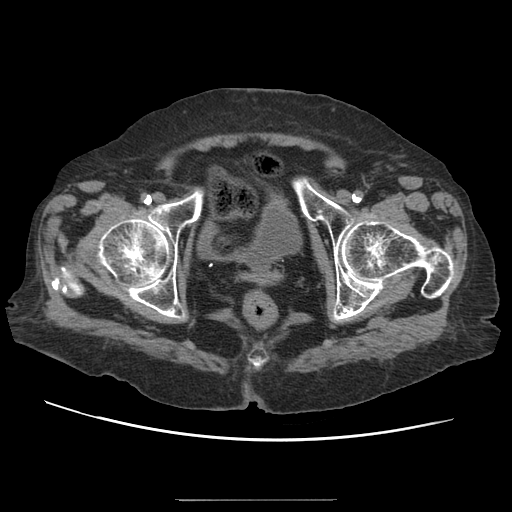
[im 50/111  soft-tissue]
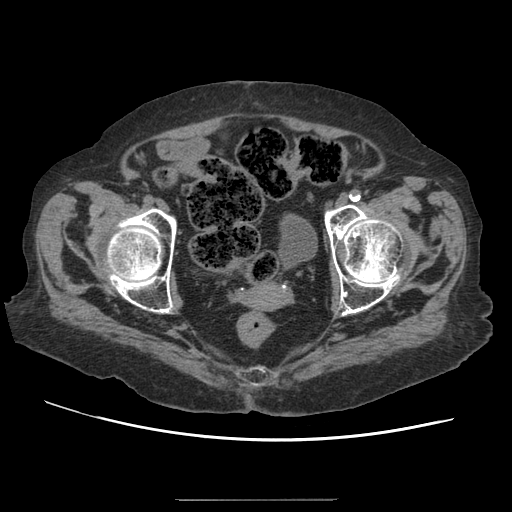
[im 61/111  soft-tissue]
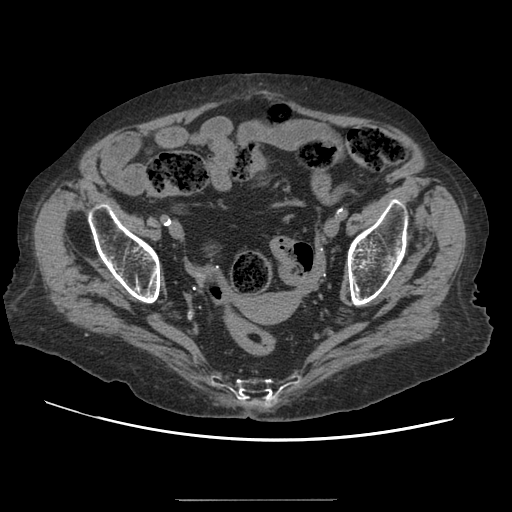
[im 68/111  soft-tissue]
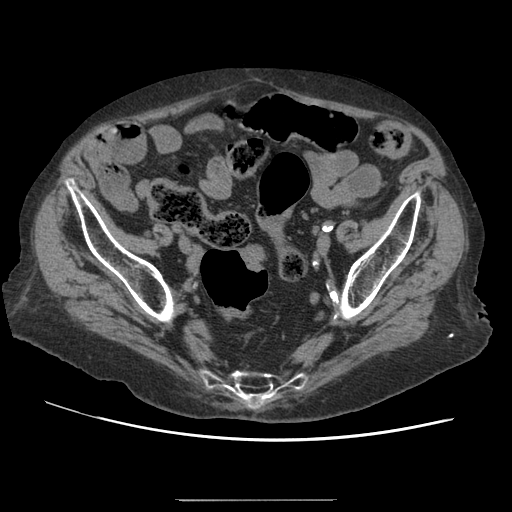
[im 68/111  bone]
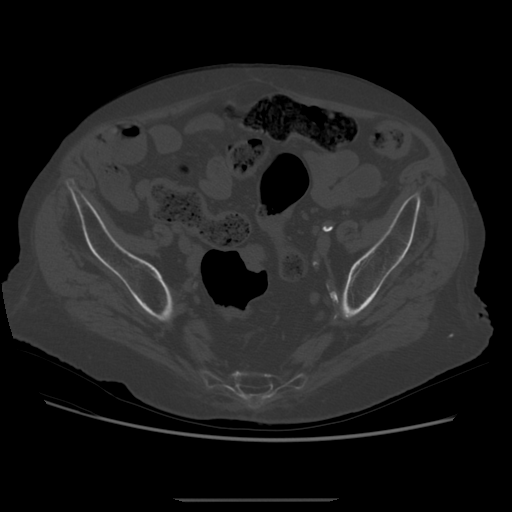
[im 75/111  soft-tissue]
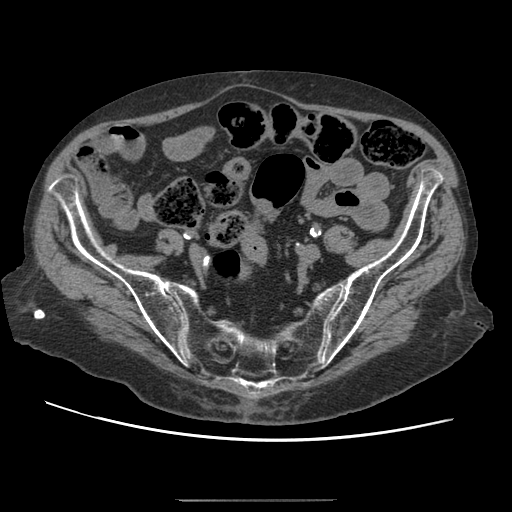
[im 82/111  soft-tissue]
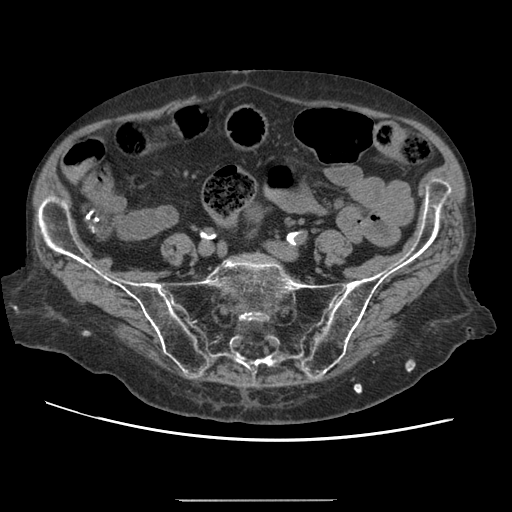
[im 89/111  soft-tissue]
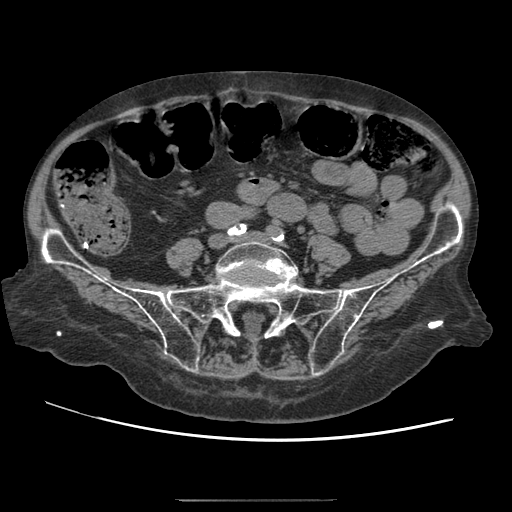
[im 96/111  soft-tissue]
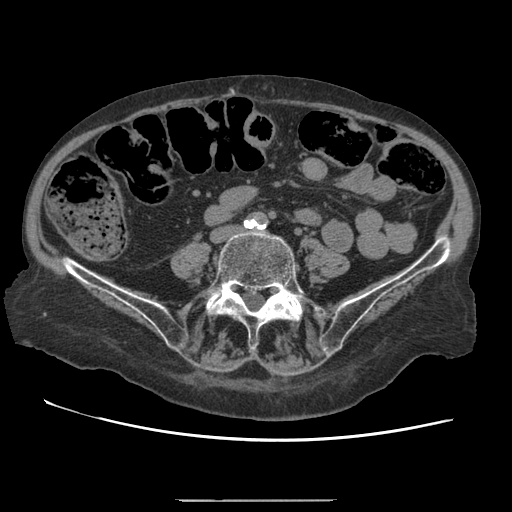
[im 103/111  soft-tissue]
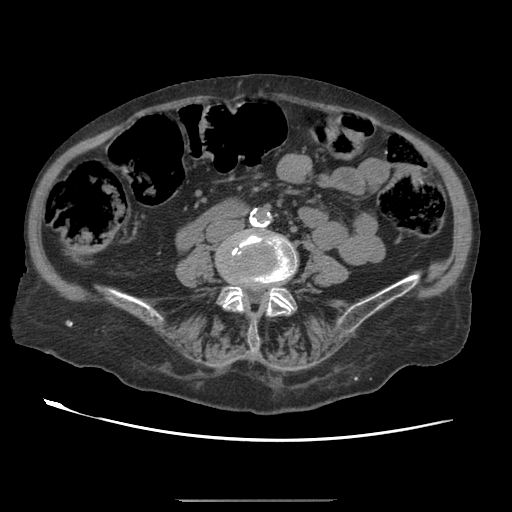

[Series 103: cor pelvis soft · coronal · 0.70mm/px · 3 of 132 slices shown]
[im 44/132  soft-tissue]
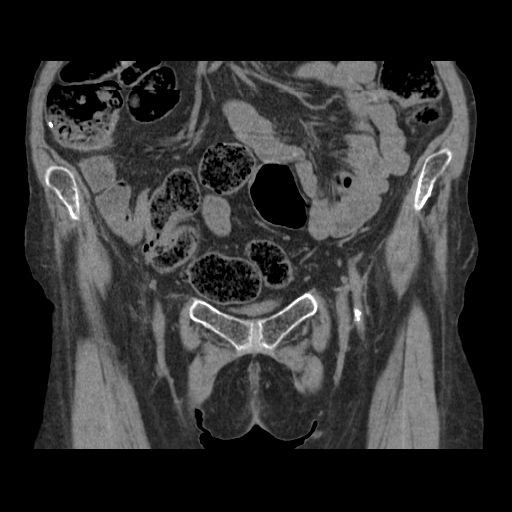
[im 59/132  soft-tissue]
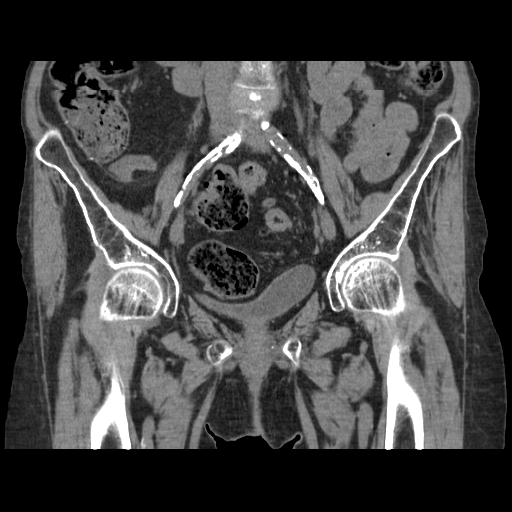
[im 73/132  soft-tissue]
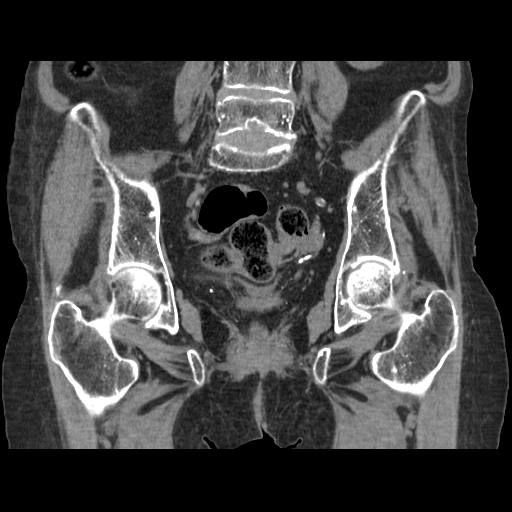

[17 of 46 positions shown; findings below may reference images not displayed]

FINDINGS: Bones appear osteopenic. No acute fracture is identified. The
patient has a remote healed inferior right pubic ramus fracture.
Both hips are located. There is sclerosis and flattening of both
femoral heads consistent with avascular necrosis, worse on the left.
There is some fragmentation of the left femoral head at the
articular surface involving an area measuring 2.2 cm AP by 0.8 cm
transverse. The appearance is not changed since the prior CT scan.
Mild flattening of the right femoral head is identified. Small,
spiculated sclerotic lesions in the right sacral ala and right
ischial tuberosity are unchanged and most consistent with bone
islands. For findings regarding the lumbar spine, please refer to
report of dedicated lumbar spine CT scan this same day.

Imaged intrapelvic contents demonstrate extensive atherosclerosis.
There is a large volume of stool in the visualized colon.
IMPRESSION: Negative for acute fracture.

Avascular necrosis of the femoral heads, worse on the left, where
there is some fragmentation identified. The appearance is not
markedly changed compared to the patient's 08/13/2011 CT abdomen and
pelvis.

Remote healed right inferior pubic ramus fracture.

Extensive atherosclerosis. Large volume of stool in the visualized
colon also noted.

## 2015-10-07 IMAGING — CT CT HEAD W/O CM
2 series · 17 of 30 positions shown, 20 images · non-contrast
Comparison: 12/27/2012

CLINICAL DATA: Headaches

EXAM:
CT HEAD WITHOUT CONTRAST
TECHNIQUE: Contiguous axial images were obtained from the base of the skull
through the vertex without intravenous contrast.

[Series 2: head w/o · axial · non-contrast · 0.48mm/px · z∈[-127,-7]mm · 9 of 31 slices shown, 12 images]
[im 4/31  brain]
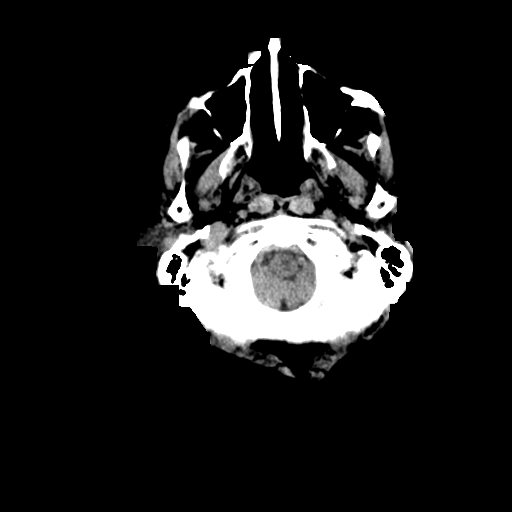
[im 4/31  bone]
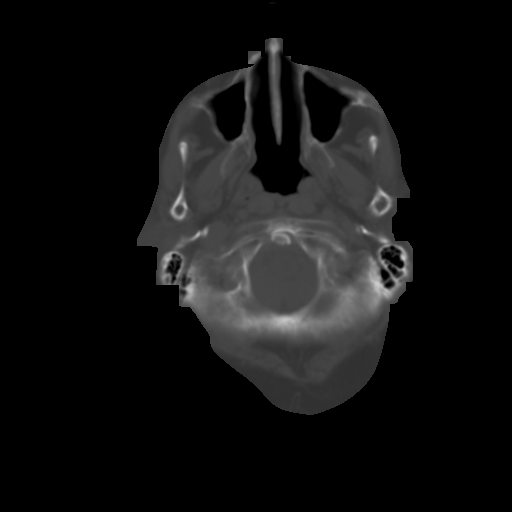
[im 7/31  brain]
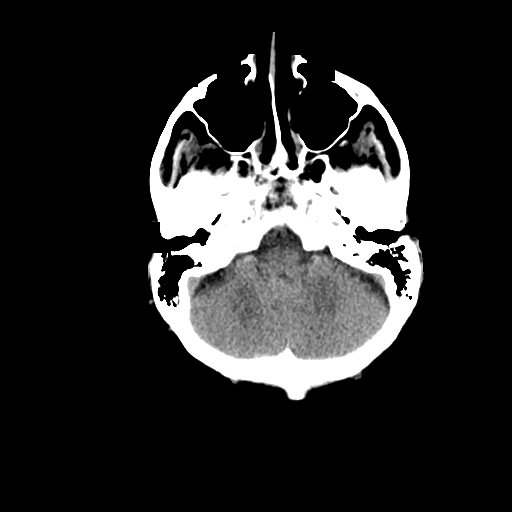
[im 10/31  brain]
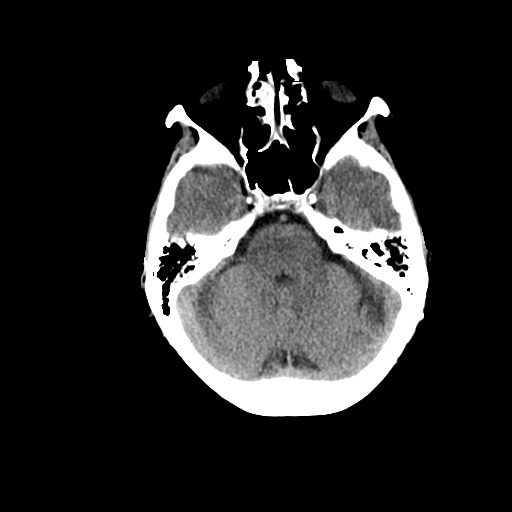
[im 13/31  brain]
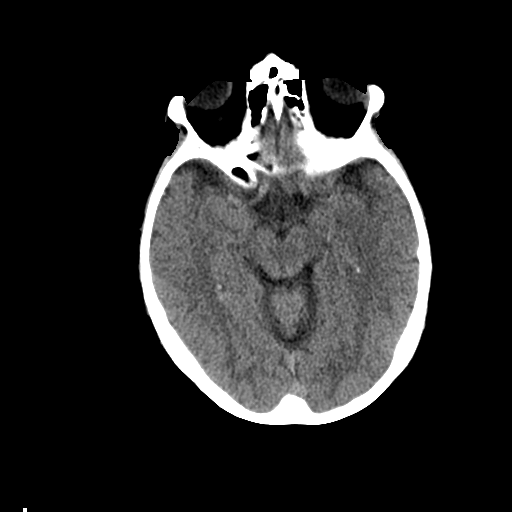
[im 16/31  brain]
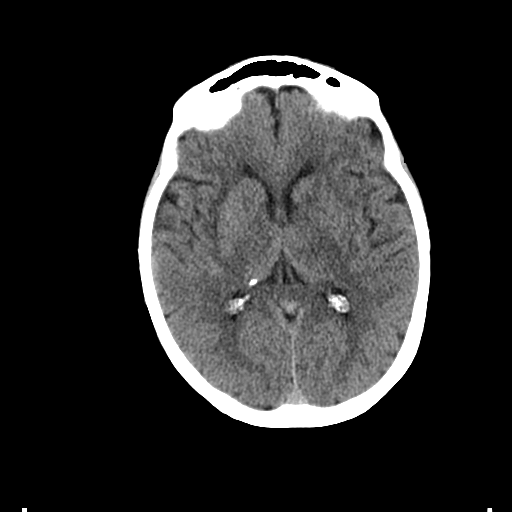
[im 16/31  bone]
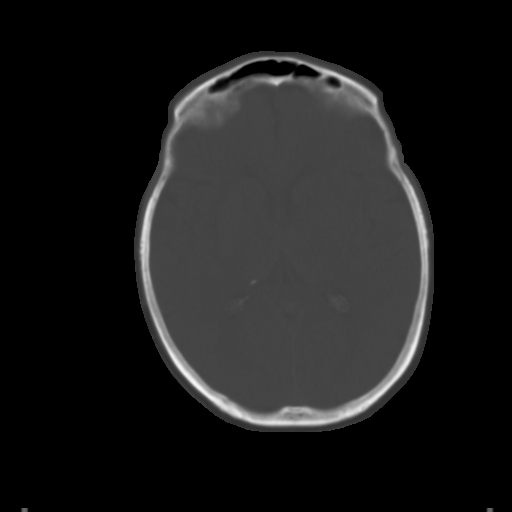
[im 19/31  brain]
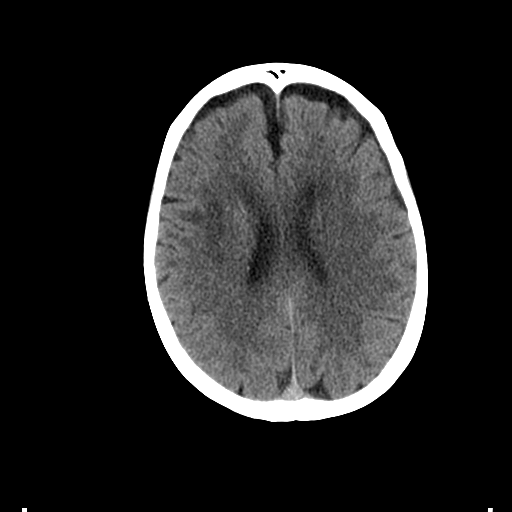
[im 22/31  brain]
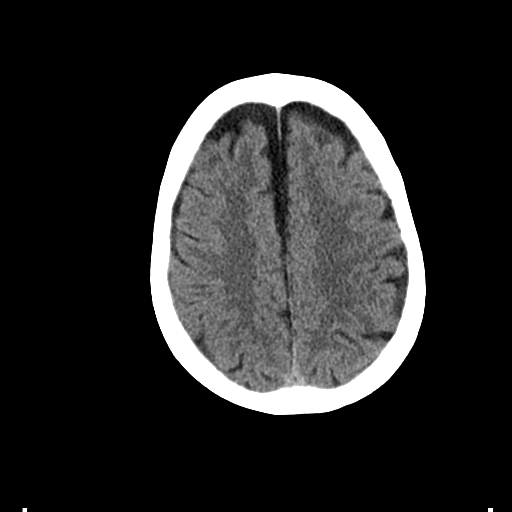
[im 25/31  brain]
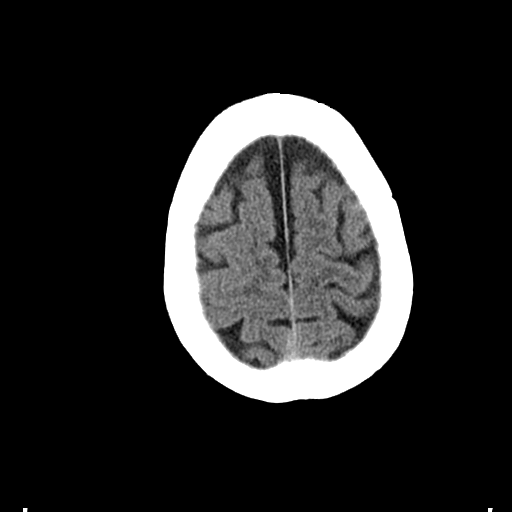
[im 28/31  brain]
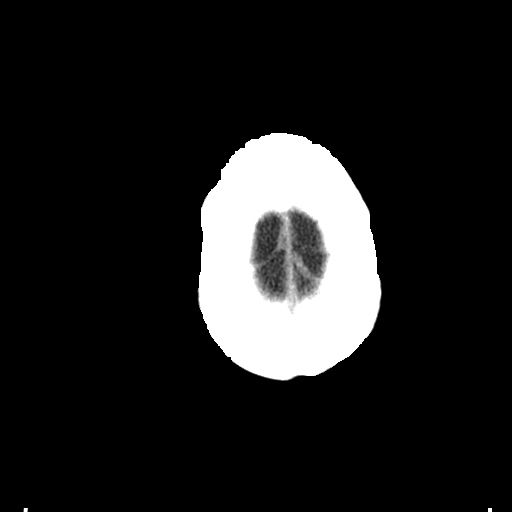
[im 28/31  bone]
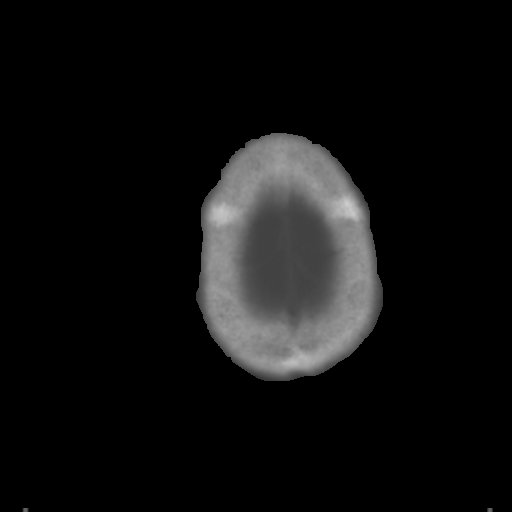

[Series 3: bone windows · axial · 0.48mm/px · z∈[-127,-7]mm · 8 of 52 slices shown]
[im 6/52  bone]
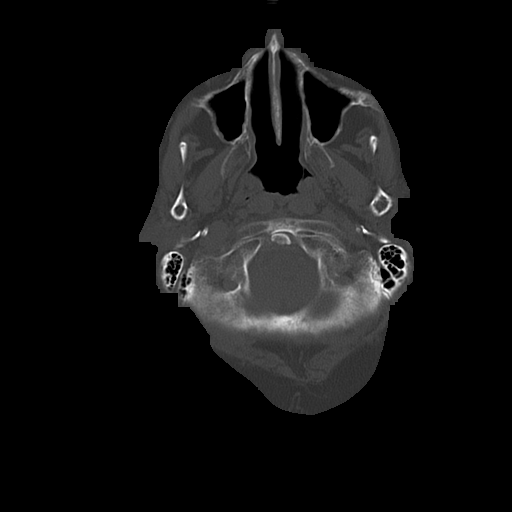
[im 12/52  bone]
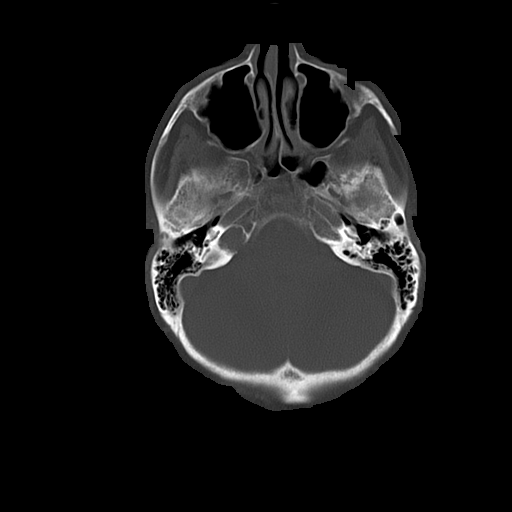
[im 18/52  bone]
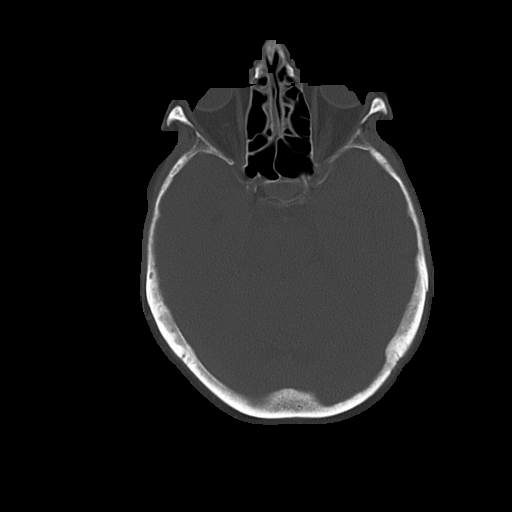
[im 23/52  bone]
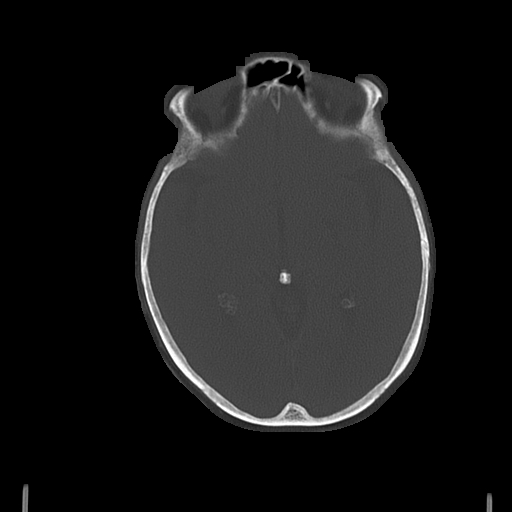
[im 29/52  bone]
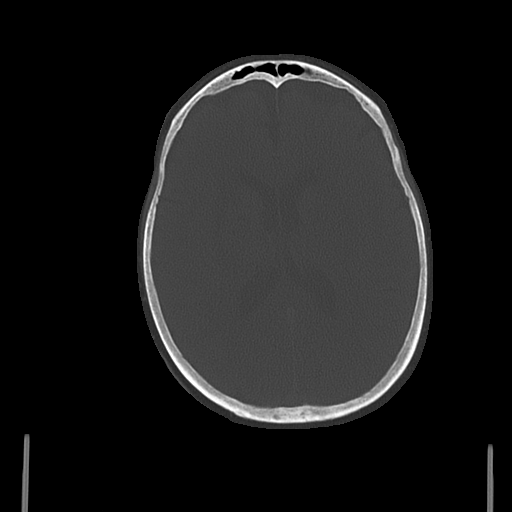
[im 35/52  bone]
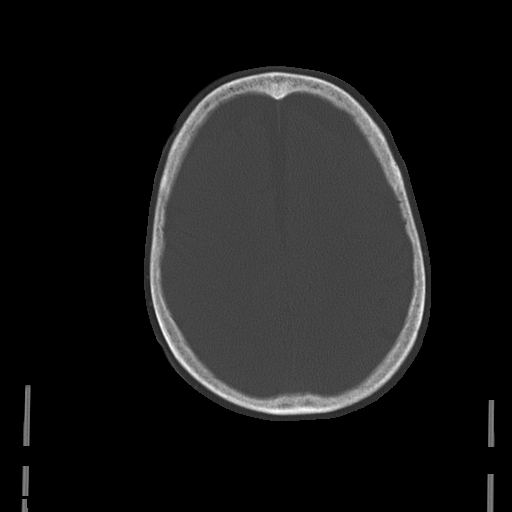
[im 40/52  bone]
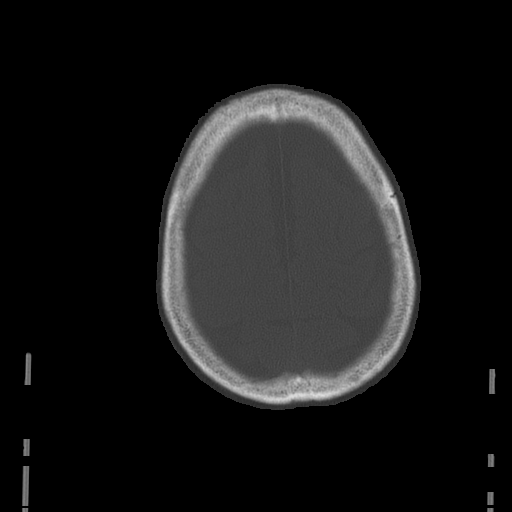
[im 46/52  bone]
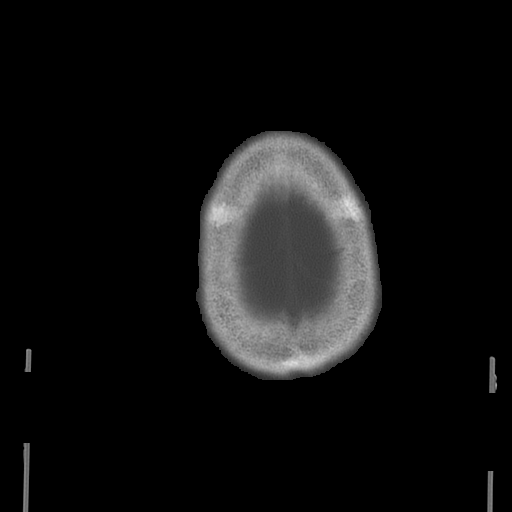

[17 of 30 positions shown; findings below may reference images not displayed]

FINDINGS: Bony calvarium is intact. Mild atrophic changes are again
identified. Scattered areas of decreased attenuation identified
consistent with chronic white matter ischemic change. Basal ganglia
lacunar infarcts are again identified bilaterally. No findings to
suggest acute hemorrhage, acute infarction or space-occupying mass
lesion are noted.
IMPRESSION: Chronic changes without acute abnormality.
